# Patient Record
Sex: Female | Born: 1985 | Race: White | Hispanic: No | Marital: Married | Smoking: Former smoker
Health system: Southern US, Community
[De-identification: ages and names within clinical notes are randomized; demographics above are authoritative.]

## PROBLEM LIST (undated history)

## (undated) ENCOUNTER — Inpatient Hospital Stay (HOSPITAL_COMMUNITY): Payer: BLUE CROSS/BLUE SHIELD

## (undated) ENCOUNTER — Inpatient Hospital Stay (HOSPITAL_COMMUNITY): Payer: Self-pay

## (undated) DIAGNOSIS — E079 Disorder of thyroid, unspecified: Secondary | ICD-10-CM

## (undated) DIAGNOSIS — E063 Autoimmune thyroiditis: Secondary | ICD-10-CM

## (undated) DIAGNOSIS — G510 Bell's palsy: Secondary | ICD-10-CM

## (undated) DIAGNOSIS — T7840XA Allergy, unspecified, initial encounter: Secondary | ICD-10-CM

## (undated) DIAGNOSIS — K219 Gastro-esophageal reflux disease without esophagitis: Secondary | ICD-10-CM

## (undated) DIAGNOSIS — E162 Hypoglycemia, unspecified: Secondary | ICD-10-CM

## (undated) DIAGNOSIS — Z8669 Personal history of other diseases of the nervous system and sense organs: Secondary | ICD-10-CM

## (undated) DIAGNOSIS — M549 Dorsalgia, unspecified: Secondary | ICD-10-CM

## (undated) DIAGNOSIS — E559 Vitamin D deficiency, unspecified: Secondary | ICD-10-CM

## (undated) DIAGNOSIS — M069 Rheumatoid arthritis, unspecified: Secondary | ICD-10-CM

## (undated) DIAGNOSIS — F840 Autistic disorder: Secondary | ICD-10-CM

## (undated) DIAGNOSIS — F419 Anxiety disorder, unspecified: Secondary | ICD-10-CM

## (undated) DIAGNOSIS — K829 Disease of gallbladder, unspecified: Secondary | ICD-10-CM

## (undated) DIAGNOSIS — Z8041 Family history of malignant neoplasm of ovary: Secondary | ICD-10-CM

## (undated) DIAGNOSIS — K5900 Constipation, unspecified: Secondary | ICD-10-CM

## (undated) DIAGNOSIS — G5600 Carpal tunnel syndrome, unspecified upper limb: Secondary | ICD-10-CM

## (undated) HISTORY — DX: Autistic disorder: F84.0

## (undated) HISTORY — DX: Allergy, unspecified, initial encounter: T78.40XA

## (undated) HISTORY — PX: TONSILECTOMY/ADENOIDECTOMY WITH MYRINGOTOMY: SHX6125

## (undated) HISTORY — DX: Disease of gallbladder, unspecified: K82.9

## (undated) HISTORY — DX: Rheumatoid arthritis, unspecified: M06.9

## (undated) HISTORY — DX: Vitamin D deficiency, unspecified: E55.9

## (undated) HISTORY — DX: Personal history of other diseases of the nervous system and sense organs: Z86.69

## (undated) HISTORY — DX: Dorsalgia, unspecified: M54.9

## (undated) HISTORY — DX: Carpal tunnel syndrome, unspecified upper limb: G56.00

## (undated) HISTORY — DX: Autoimmune thyroiditis: E06.3

## (undated) HISTORY — DX: Disorder of thyroid, unspecified: E07.9

## (undated) HISTORY — DX: Hypoglycemia, unspecified: E16.2

## (undated) HISTORY — PX: OTHER SURGICAL HISTORY: SHX169

## (undated) HISTORY — DX: Bell's palsy: G51.0

## (undated) HISTORY — DX: Family history of malignant neoplasm of ovary: Z80.41

## (undated) HISTORY — DX: Constipation, unspecified: K59.00

## (undated) HISTORY — DX: Gastro-esophageal reflux disease without esophagitis: K21.9

---

## 2005-03-13 HISTORY — PX: CHOLECYSTECTOMY: SHX55

## 2006-03-30 ENCOUNTER — Inpatient Hospital Stay: Payer: Self-pay | Admitting: Surgery

## 2008-10-19 ENCOUNTER — Emergency Department: Payer: Self-pay | Admitting: Emergency Medicine

## 2009-05-12 ENCOUNTER — Other Ambulatory Visit: Admission: RE | Admit: 2009-05-12 | Discharge: 2009-05-12 | Payer: Self-pay | Admitting: Obstetrics and Gynecology

## 2009-07-06 ENCOUNTER — Ambulatory Visit (HOSPITAL_COMMUNITY): Admission: RE | Admit: 2009-07-06 | Discharge: 2009-07-06 | Payer: Self-pay | Admitting: Obstetrics and Gynecology

## 2009-08-17 ENCOUNTER — Ambulatory Visit (HOSPITAL_COMMUNITY): Admission: RE | Admit: 2009-08-17 | Discharge: 2009-08-17 | Payer: Self-pay | Admitting: Obstetrics and Gynecology

## 2009-09-11 ENCOUNTER — Inpatient Hospital Stay (HOSPITAL_COMMUNITY)
Admission: AD | Admit: 2009-09-11 | Discharge: 2009-09-11 | Payer: Self-pay | Source: Home / Self Care | Admitting: Obstetrics and Gynecology

## 2009-09-11 ENCOUNTER — Ambulatory Visit: Payer: Self-pay | Admitting: Gynecology

## 2009-09-11 DIAGNOSIS — O368190 Decreased fetal movements, unspecified trimester, not applicable or unspecified: Secondary | ICD-10-CM

## 2009-09-12 ENCOUNTER — Ambulatory Visit (HOSPITAL_COMMUNITY): Admission: AD | Admit: 2009-09-12 | Discharge: 2009-09-12 | Payer: Self-pay | Admitting: Obstetrics and Gynecology

## 2009-10-04 ENCOUNTER — Inpatient Hospital Stay (HOSPITAL_COMMUNITY): Admission: AD | Admit: 2009-10-04 | Discharge: 2009-10-04 | Payer: Self-pay | Admitting: Obstetrics and Gynecology

## 2009-10-20 ENCOUNTER — Inpatient Hospital Stay (HOSPITAL_COMMUNITY): Admission: AD | Admit: 2009-10-20 | Discharge: 2009-10-20 | Payer: Self-pay | Admitting: Obstetrics and Gynecology

## 2009-10-20 DIAGNOSIS — O9989 Other specified diseases and conditions complicating pregnancy, childbirth and the puerperium: Secondary | ICD-10-CM

## 2009-10-20 DIAGNOSIS — O99891 Other specified diseases and conditions complicating pregnancy: Secondary | ICD-10-CM

## 2009-10-20 DIAGNOSIS — R109 Unspecified abdominal pain: Secondary | ICD-10-CM

## 2009-11-09 ENCOUNTER — Inpatient Hospital Stay (HOSPITAL_COMMUNITY): Admission: AD | Admit: 2009-11-09 | Discharge: 2009-11-09 | Payer: Self-pay | Admitting: Obstetrics and Gynecology

## 2009-11-09 ENCOUNTER — Ambulatory Visit: Payer: Self-pay | Admitting: Obstetrics and Gynecology

## 2009-11-19 ENCOUNTER — Inpatient Hospital Stay (HOSPITAL_COMMUNITY): Admission: AD | Admit: 2009-11-19 | Discharge: 2009-11-20 | Payer: Self-pay | Admitting: Obstetrics and Gynecology

## 2009-11-19 DIAGNOSIS — O368190 Decreased fetal movements, unspecified trimester, not applicable or unspecified: Secondary | ICD-10-CM

## 2009-11-27 ENCOUNTER — Inpatient Hospital Stay (HOSPITAL_COMMUNITY): Admission: AD | Admit: 2009-11-27 | Discharge: 2009-11-30 | Payer: Self-pay | Admitting: Obstetrics and Gynecology

## 2010-05-26 LAB — CBC
HCT: 25.5 % — ABNORMAL LOW (ref 36.0–46.0)
HCT: 28.8 % — ABNORMAL LOW (ref 36.0–46.0)
HCT: 37.3 % (ref 36.0–46.0)
HCT: 38 % (ref 36.0–46.0)
Hemoglobin: 12.6 g/dL (ref 12.0–15.0)
Hemoglobin: 8.9 g/dL — ABNORMAL LOW (ref 12.0–15.0)
Hemoglobin: 9.9 g/dL — ABNORMAL LOW (ref 12.0–15.0)
Hemoglobin: 12.9 g/dL (ref 12.0–15.0)
MCH: 34.1 pg — ABNORMAL HIGH (ref 26.0–34.0)
MCH: 34.4 pg — ABNORMAL HIGH (ref 26.0–34.0)
MCH: 35.1 pg — ABNORMAL HIGH (ref 26.0–34.0)
MCH: 33.8 pg (ref 26.0–34.0)
MCHC: 34 g/dL (ref 30.0–36.0)
MCHC: 34.3 g/dL (ref 30.0–36.0)
MCHC: 35 g/dL (ref 30.0–36.0)
MCHC: 34 g/dL (ref 30.0–36.0)
MCV: 100.2 fL — ABNORMAL HIGH (ref 78.0–100.0)
MCV: 100.4 fL — ABNORMAL HIGH (ref 78.0–100.0)
MCV: 100.4 fL — ABNORMAL HIGH (ref 78.0–100.0)
MCV: 99.6 fL (ref 78.0–100.0)
Platelets: 214 10*3/uL (ref 150–400)
Platelets: 232 10*3/uL (ref 150–400)
Platelets: 243 10*3/uL (ref 150–400)
Platelets: 241 10*3/uL (ref 150–400)
RBC: 2.55 MIL/uL — ABNORMAL LOW (ref 3.87–5.11)
RBC: 2.87 MIL/uL — ABNORMAL LOW (ref 3.87–5.11)
RBC: 3.71 MIL/uL — ABNORMAL LOW (ref 3.87–5.11)
RBC: 3.81 MIL/uL — ABNORMAL LOW (ref 3.87–5.11)
RDW: 12.7 % (ref 11.5–15.5)
RDW: 12.8 % (ref 11.5–15.5)
RDW: 13.1 % (ref 11.5–15.5)
RDW: 12.8 % (ref 11.5–15.5)
WBC: 14 10*3/uL — ABNORMAL HIGH (ref 4.0–10.5)
WBC: 21.6 10*3/uL — ABNORMAL HIGH (ref 4.0–10.5)
WBC: 9.8 10*3/uL (ref 4.0–10.5)
WBC: 11.2 10*3/uL — ABNORMAL HIGH (ref 4.0–10.5)

## 2010-05-26 LAB — COMPREHENSIVE METABOLIC PANEL
ALT: 15 U/L (ref 0–35)
AST: 18 U/L (ref 0–37)
Albumin: 2.7 g/dL — ABNORMAL LOW (ref 3.5–5.2)
Alkaline Phosphatase: 133 U/L — ABNORMAL HIGH (ref 39–117)
BUN: 3 mg/dL — ABNORMAL LOW (ref 6–23)
CO2: 25 mEq/L (ref 19–32)
Calcium: 8.9 mg/dL (ref 8.4–10.5)
Chloride: 106 mEq/L (ref 96–112)
Creatinine, Ser: 0.52 mg/dL (ref 0.4–1.2)
GFR calc Af Amer: >60 mL/min (ref >60)
GFR calc non Af Amer: >60 mL/min (ref >60)
Glucose, Bld: 86 mg/dL (ref 70–99)
Potassium: 3.6 mEq/L (ref 3.5–5.1)
Sodium: 135 mEq/L (ref 135–145)
Total Bilirubin: 0.8 mg/dL (ref 0.3–1.2)
Total Protein: 6.2 g/dL (ref 6.0–8.3)

## 2010-05-26 LAB — GLUCOSE, CAPILLARY
Glucose-Capillary: 162 mg/dL — ABNORMAL HIGH (ref 70–99)
Glucose-Capillary: 170 mg/dL — ABNORMAL HIGH (ref 70–99)

## 2010-05-26 LAB — URINALYSIS, ROUTINE W REFLEX MICROSCOPIC
Bilirubin Urine: NEGATIVE
Glucose, UA: NEGATIVE mg/dL
Hgb urine dipstick: NEGATIVE
Ketones, ur: 15 mg/dL — AB
Nitrite: NEGATIVE
Protein, ur: NEGATIVE mg/dL
Specific Gravity, Urine: 1.015 (ref 1.005–1.030)
Urobilinogen, UA: 0.2 mg/dL (ref 0.0–1.0)
pH: 6 (ref 5.0–8.0)

## 2010-05-26 LAB — LACTATE DEHYDROGENASE: LDH: 109 U/L (ref 94–250)

## 2010-05-26 LAB — URIC ACID: Uric Acid, Serum: 5.5 mg/dL (ref 2.4–7.0)

## 2010-05-26 LAB — RPR: RPR Ser Ql: NONREACTIVE

## 2010-05-27 LAB — URINALYSIS, ROUTINE W REFLEX MICROSCOPIC
Bilirubin Urine: NEGATIVE
Bilirubin Urine: NEGATIVE
Glucose, UA: NEGATIVE mg/dL
Glucose, UA: NEGATIVE mg/dL
Hgb urine dipstick: NEGATIVE
Hgb urine dipstick: NEGATIVE
Ketones, ur: NEGATIVE mg/dL
Ketones, ur: NEGATIVE mg/dL
Nitrite: NEGATIVE
Nitrite: NEGATIVE
Protein, ur: NEGATIVE mg/dL
Protein, ur: NEGATIVE mg/dL
Specific Gravity, Urine: >1.03 — ABNORMAL HIGH (ref 1.005–1.030)
Specific Gravity, Urine: 1.02 (ref 1.005–1.030)
Urobilinogen, UA: 1 mg/dL (ref 0.0–1.0)
Urobilinogen, UA: 0.2 mg/dL (ref 0.0–1.0)
pH: 6 (ref 5.0–8.0)
pH: 7 (ref 5.0–8.0)

## 2010-05-27 LAB — WET PREP, GENITAL
Trich, Wet Prep: NONE SEEN
Yeast Wet Prep HPF POC: NONE SEEN

## 2010-05-28 LAB — URINALYSIS, ROUTINE W REFLEX MICROSCOPIC
Bilirubin Urine: NEGATIVE
Glucose, UA: NEGATIVE mg/dL
Hgb urine dipstick: NEGATIVE
Ketones, ur: NEGATIVE mg/dL
Nitrite: NEGATIVE
Protein, ur: NEGATIVE mg/dL
Specific Gravity, Urine: 1.015 (ref 1.005–1.030)
Urobilinogen, UA: 0.2 mg/dL (ref 0.0–1.0)
pH: 7 (ref 5.0–8.0)

## 2010-05-29 LAB — COMPREHENSIVE METABOLIC PANEL
ALT: 16 U/L (ref 0–35)
AST: 17 U/L (ref 0–37)
Albumin: 3 g/dL — ABNORMAL LOW (ref 3.5–5.2)
Alkaline Phosphatase: 71 U/L (ref 39–117)
BUN: 4 mg/dL — ABNORMAL LOW (ref 6–23)
CO2: 23 mEq/L (ref 19–32)
Calcium: 9.2 mg/dL (ref 8.4–10.5)
Chloride: 104 mEq/L (ref 96–112)
Creatinine, Ser: 0.43 mg/dL (ref 0.4–1.2)
GFR calc Af Amer: >60 mL/min (ref >60)
GFR calc non Af Amer: >60 mL/min (ref >60)
Glucose, Bld: 84 mg/dL (ref 70–99)
Potassium: 3.9 mEq/L (ref 3.5–5.1)
Sodium: 134 mEq/L — ABNORMAL LOW (ref 135–145)
Total Bilirubin: 1 mg/dL (ref 0.3–1.2)
Total Protein: 6.9 g/dL (ref 6.0–8.3)

## 2010-05-29 LAB — CBC
HCT: 38.2 % (ref 36.0–46.0)
Hemoglobin: 13.1 g/dL (ref 12.0–15.0)
MCH: 34.3 pg — ABNORMAL HIGH (ref 26.0–34.0)
MCHC: 34.5 g/dL (ref 30.0–36.0)
MCV: 99.6 fL (ref 78.0–100.0)
Platelets: 256 10*3/uL (ref 150–400)
RBC: 3.83 MIL/uL — ABNORMAL LOW (ref 3.87–5.11)
RDW: 13 % (ref 11.5–15.5)
WBC: 9.2 10*3/uL (ref 4.0–10.5)

## 2010-05-29 LAB — CREATININE CLEARANCE, URINE, 24 HOUR
Collection Interval-CRCL: 24 hours
Creatinine Clearance: 122 mL/min — ABNORMAL HIGH (ref 75–115)
Creatinine, 24H Ur: 757 mg/d (ref 700–1800)
Creatinine, Urine: 24.2 mg/dL
Creatinine: 0.43 mg/dL (ref 0.4–1.2)
Urine Total Volume-CRCL: 3130 mL

## 2010-05-29 LAB — PROTEIN, URINE, 24 HOUR
Collection Interval-UPROT: 24 hours
Protein, 24H Urine: 31 mg/d — ABNORMAL LOW (ref 50–100)
Protein, Urine: 1 mg/dL
Urine Total Volume-UPROT: 3130 mL

## 2010-05-29 LAB — URINALYSIS, ROUTINE W REFLEX MICROSCOPIC
Bilirubin Urine: NEGATIVE
Glucose, UA: NEGATIVE mg/dL
Hgb urine dipstick: NEGATIVE
Ketones, ur: NEGATIVE mg/dL
Nitrite: NEGATIVE
Protein, ur: NEGATIVE mg/dL
Specific Gravity, Urine: 1.01 (ref 1.005–1.030)
Urobilinogen, UA: 0.2 mg/dL (ref 0.0–1.0)
pH: 6.5 (ref 5.0–8.0)

## 2010-05-29 LAB — CREATININE, SERUM
Creatinine, Ser: 0.43 mg/dL (ref 0.4–1.2)
GFR calc Af Amer: >60 mL/min (ref >60)
GFR calc non Af Amer: >60 mL/min (ref >60)

## 2010-05-29 LAB — DIFFERENTIAL
Basophils Absolute: 0 10*3/uL (ref 0.0–0.1)
Basophils Relative: 0 % (ref 0–1)
Eosinophils Absolute: 0 10*3/uL (ref 0.0–0.7)
Eosinophils Relative: 0 % (ref 0–5)
Lymphocytes Relative: 20 % (ref 12–46)
Lymphs Abs: 1.8 10*3/uL (ref 0.7–4.0)
Monocytes Absolute: 0.7 10*3/uL (ref 0.1–1.0)
Monocytes Relative: 7 % (ref 3–12)
Neutro Abs: 6.7 10*3/uL (ref 1.7–7.7)
Neutrophils Relative %: 72 % (ref 43–77)

## 2010-05-29 LAB — URIC ACID: Uric Acid, Serum: 5.2 mg/dL (ref 2.4–7.0)

## 2010-05-29 LAB — LACTATE DEHYDROGENASE: LDH: 92 U/L — ABNORMAL LOW (ref 94–250)

## 2011-04-17 ENCOUNTER — Ambulatory Visit: Payer: BC Managed Care – PPO | Admitting: Family Medicine

## 2011-06-02 ENCOUNTER — Encounter: Payer: Self-pay | Admitting: Family Medicine

## 2011-06-02 ENCOUNTER — Ambulatory Visit (INDEPENDENT_AMBULATORY_CARE_PROVIDER_SITE_OTHER)
Admission: RE | Admit: 2011-06-02 | Discharge: 2011-06-02 | Disposition: A | Discharge status: Home / Self Care | Payer: 59 | Source: Ambulatory Visit | Attending: Family Medicine | Admitting: Family Medicine

## 2011-06-02 ENCOUNTER — Ambulatory Visit (INDEPENDENT_AMBULATORY_CARE_PROVIDER_SITE_OTHER): Payer: 59 | Admitting: Family Medicine

## 2011-06-02 VITALS — BP 118/82 | HR 94 | Temp 98.0°F | Ht 68.5 in | Wt 248.1 lb

## 2011-06-02 DIAGNOSIS — Z6838 Body mass index (BMI) 38.0-38.9, adult: Secondary | ICD-10-CM | POA: Insufficient documentation

## 2011-06-02 DIAGNOSIS — E669 Obesity, unspecified: Secondary | ICD-10-CM

## 2011-06-02 DIAGNOSIS — E079 Disorder of thyroid, unspecified: Secondary | ICD-10-CM | POA: Insufficient documentation

## 2011-06-02 DIAGNOSIS — Z6841 Body Mass Index (BMI) 40.0 and over, adult: Secondary | ICD-10-CM | POA: Insufficient documentation

## 2011-06-02 DIAGNOSIS — E039 Hypothyroidism, unspecified: Secondary | ICD-10-CM | POA: Insufficient documentation

## 2011-06-02 DIAGNOSIS — M549 Dorsalgia, unspecified: Secondary | ICD-10-CM

## 2011-06-02 DIAGNOSIS — E6609 Other obesity due to excess calories: Secondary | ICD-10-CM | POA: Insufficient documentation

## 2011-06-02 NOTE — Patient Instructions (Signed)
Nice to meet you. We will call you with your xray results. Let me know when you are due for labs.

## 2011-06-02 NOTE — Progress Notes (Signed)
Subjective:    Patient ID: Vanessa Adams Seen, female    DOB: 1986/03/05, 26 y.o.   MRN: [id number]  HPI  26 yo here to establish care.  Low back pain- Pt is a CNA at [employer]. 3 weeks ago, was turning a bariatric patient who pulled on her left arm and pulled her down. Since then, left sided back pain with left leg radiculopathy extending into her foot. No LE weakness. Went to occupational health, given flexeril. Muscle spasms have improved but still has sharp pain with bending, sitting and changing positions with persistent radiculopathy. No urinary symptoms.  Contraception- G1p1- daughter is 46 1/2 years old. Has been on OCPs but forgets to take them. Does not want an IUD and did not like the Nuva ring(falling out frequently). Wants to know what her options are.  Obesity- has been trying to loose weight for months, frustrated. Tried weight watchers, calorie restrictions, exercise- not seeing results.  Hypothyroidism- diagnosed shortly after her daughter was born. On Synthroid 25 micrograms daily. Denies any symptoms of hypo or hyperthyroidism. Does not think she is yet due for labs.  Patient Active Problem List  Diagnoses  . Back pain with radiation  . Thyroid disease  . Obesity   Past Medical History  Diagnosis Date  . Thyroid disease   . Allergy    Past Surgical History  Procedure Date  . Cholecystectomy 2007   History  Substance Use Topics  . Smoking status: Never Smoker   . Smokeless tobacco: Not on file  . Alcohol Use: Yes   Family History  Problem Relation Age of Onset  . Cancer Mother 72  . Hyperlipidemia Father   . Hypertension Father   . Cancer Maternal Grandmother 45    ovarian cancer   Allergies  Allergen Reactions  . Tuberculin Tests    No current outpatient prescriptions on file prior to visit.   The PMH, PSH, Social History, Family History, Medications, and allergies have been reviewed in [facility], and have been updated if  relevant.    Review of Systems See HPI Patient reports no  vision/ hearing changes,anorexia, focal weakness, depression, anxiety    Objective:   Physical Exam BP 118/82  Pulse 94  Temp(Src) 98 F (36.7 C) (Oral)  Ht 5' 8.5" (1.74 m)  Wt 248 lb 1.9 oz (112.546 kg)  BMI 37.18 kg/m2  SpO2 98%  LMP 05/28/2011  General:  Well-developed,well-nourished,in no acute distress; alert,appropriate and cooperative throughout examination Head:  normocephalic and atraumatic.   Eyes:  vision grossly intact, pupils equal, pupils round, and pupils reactive to light.   Ears:  R ear normal and L ear normal.   Nose:  no external deformity.   Mouth:  good dentition.   Neck:  No deformities, masses, or tenderness noted.  Lungs:  Normal respiratory effort, chest expands symmetrically. Lungs are clear to auscultation, no crackles or wheezes. Heart:  Normal rate and regular rhythm. S1 and S2 normal without gallop, murmur, click, rub or other extra sounds. Abdomen:  Bowel sounds positive,abdomen soft and non-tender without masses, organomegaly or hernias noted. Msk:  No deformity or scoliosis noted of thoracic or lumbar spine.   Extremities:  No clubbing, cyanosis, edema, or deformity noted with normal full range of motion of all joints, pos SLR left otherwise unremarkable exam.   Neurologic:  alert & oriented X3 and gait normal.   Skin:  Intact without suspicious lesions or rashes Psych:  Cognition and judgment appear intact. Alert and cooperative  with normal attention span and concentration. No apparent delusions, illusions, hallucinations     Assessment & Plan:   1. Back pain with radiation  New- improving. Will get xray given radiculopathy. Continue conservation management. The patient indicates understanding of these issues and agrees with the plan.  DG Lumbar Spine Complete  2. Thyroid disease  Stable.  Awaiting records, continue current dose of synthroid.   3. Contraception  management Discussed options- she would like to try implanon and will call her OBGYN (Dr. Gerald Leitz) for appt.

## 2011-06-07 ENCOUNTER — Ambulatory Visit (INDEPENDENT_AMBULATORY_CARE_PROVIDER_SITE_OTHER): Payer: 59 | Admitting: Family Medicine

## 2011-06-07 ENCOUNTER — Other Ambulatory Visit (HOSPITAL_COMMUNITY)
Admission: RE | Admit: 2011-06-07 | Discharge: 2011-06-07 | Disposition: A | Discharge status: Home / Self Care | Payer: 59 | Source: Ambulatory Visit | Attending: Family Medicine | Admitting: Family Medicine

## 2011-06-07 ENCOUNTER — Encounter: Payer: Self-pay | Admitting: Family Medicine

## 2011-06-07 VITALS — BP 120/90 | HR 68 | Temp 97.4°F | Wt 246.0 lb

## 2011-06-07 DIAGNOSIS — Z01419 Encounter for gynecological examination (general) (routine) without abnormal findings: Secondary | ICD-10-CM | POA: Insufficient documentation

## 2011-06-07 MED ORDER — NORGESTIM-ETH ESTRAD TRIPHASIC 0.18/0.215/0.25 MG-35 MCG PO TABS: 1.0000 | ORAL_TABLET | Freq: Every day | ORAL | Status: DC

## 2011-06-07 NOTE — Progress Notes (Signed)
Subjective:    Patient ID: Vanessa Adams, female    DOB: [birth year], 26 y.o.   MRN: [id number]  HPI  26 yo here for GYN exam only.   G1p1- daughter is 102 1/2 years old. Has been on OCPs but forgets to take them. Does not want an IUD and did not like the Nuva ring(falling out frequently). Discussed implanon- she called [facility] who cannot see her for several weeks so she is here for Pap/GYN exam.      ROS: See HPI Patient reports no  vision/ hearing changes,anorexia, weight change, fever ,adenopathy, persistant / recurrent hoarseness, swallowing issues, chest pain, edema,persistant / recurrent cough, hemoptysis, dyspnea(rest, exertional, paroxysmal nocturnal), gastrointestinal  bleeding (melena, rectal bleeding), abdominal pain, excessive heart burn, GU symptoms(dysuria, hematuria, pyuria, voiding/incontinence  Issues) syncope, focal weakness, severe memory loss, concerning skin lesions, depression, anxiety, abnormal bruising/bleeding, major joint swelling, breast masses or abnormal vaginal bleeding.    Patient Active Problem List  Diagnoses  . Back pain with radiation  . Thyroid disease  . Obesity  . Gynecological examination   Past Medical History  Diagnosis Date  . Thyroid disease   . Allergy    Past Surgical History  Procedure Date  . Cholecystectomy 2007   History  Substance Use Topics  . Smoking status: Never Smoker   . Smokeless tobacco: Not on file  . Alcohol Use: Yes   Family History  Problem Relation Age of Onset  . Cancer Mother 37  . Hyperlipidemia Father   . Hypertension Father   . Cancer Maternal Grandmother 45    ovarian cancer   Allergies  Allergen Reactions  . Tuberculin Tests    Current Outpatient Prescriptions on File Prior to Visit  Medication Sig Dispense Refill  . cetirizine (ZYRTEC) 10 MG tablet Take 10 mg by mouth daily.      . cyclobenzaprine (FLEXERIL) 10 MG tablet Take 10 mg by mouth 3 (three) times daily as needed.      Marland Kitchen  levothyroxine (SYNTHROID, LEVOTHROID) 25 MCG tablet Take 25 mcg by mouth daily.      . Norgestimate-Ethinyl Estradiol Triphasic (TRINESSA, 28,) 0.18/0.215/0.25 MG-35 MCG tablet Take 1 tablet by mouth daily.       The PMH, PSH, Social History, Family History, Medications, and allergies have been reviewed in [facility], and have been updated if relevant.    Review of Systems See HPI Patient reports no  vision/ hearing changes,anorexia, focal weakness, depression, anxiety    Objective:   Physical Exam LMP 05/28/2011 BP 120/90  Pulse 68  Temp(Src) 97.4 F (36.3 C) (Oral)  Wt 246 lb (111.585 kg)  LMP 05/28/2011  General:  Well-developed,well-nourished,in no acute distress; alert,appropriate and cooperative throughout examination Head:  normocephalic and atraumatic.   Eyes:  vision grossly intact, pupils equal, pupils round, and pupils reactive to light.   Rectal:  no external abnormalities.   Genitalia:  Pelvic Exam:        External: normal female genitalia without lesions or masses        Vagina: normal without lesions or masses        Cervix: normal without lesions or masses        Adnexa: normal bimanual exam without masses or fullness        Uterus: normal by palpation        Pap smear: performed Psych:  Cognition and judgment appear intact. Alert and cooperative with normal attention span and concentration. No apparent delusions, illusions, hallucinations  Assessment & Plan:   1. Gynecological examination    Reviewed preventive care protocols, scheduled due services, and updated immunizations Discussed nutrition, exercise, diet, and healthy lifestyle. Pap performed today.

## 2011-06-07 NOTE — Progress Notes (Signed)
Addended by: Eliezer Bottom on: 06/07/2011 10:31 AM   Modules accepted: Orders

## 2011-06-13 ENCOUNTER — Encounter: Payer: Self-pay | Admitting: *Deleted

## 2011-06-13 LAB — HM PAP SMEAR: HM Pap smear: NORMAL

## 2012-01-12 ENCOUNTER — Other Ambulatory Visit: Payer: Self-pay

## 2012-01-12 NOTE — Telephone Encounter (Signed)
Faxed request [facility] for levothyroxine 0.025mg  instructions take one daily # 30; last filled 10/29/11. No recent labs.Please advise.

## 2012-01-12 NOTE — Telephone Encounter (Signed)
Ok to refill one month.  Needs TSH and FT4 checked prior to giving more refills.

## 2012-01-15 ENCOUNTER — Other Ambulatory Visit: Payer: Self-pay | Admitting: *Deleted

## 2012-01-15 MED ORDER — LEVOTHYROXINE SODIUM 25 MCG PO TABS: 25.0000 ug | ORAL_TABLET | Freq: Every day | ORAL | Status: DC

## 2012-01-15 NOTE — Telephone Encounter (Signed)
Advised patient she will need labs drawn before further refills, she said she will call back to schedule.

## 2012-01-24 ENCOUNTER — Other Ambulatory Visit: Payer: Self-pay | Admitting: Family Medicine

## 2012-05-29 ENCOUNTER — Other Ambulatory Visit: Payer: Self-pay | Admitting: *Deleted

## 2012-05-29 MED ORDER — NORGESTIM-ETH ESTRAD TRIPHASIC 0.18/0.215/0.25 MG-35 MCG PO TABS: ORAL_TABLET | ORAL | Status: DC

## 2012-05-29 NOTE — Telephone Encounter (Signed)
Ok to refill? Has not been seen in 1 year and no upcoming appts.

## 2012-07-03 ENCOUNTER — Other Ambulatory Visit: Payer: Self-pay | Admitting: *Deleted

## 2012-07-03 MED ORDER — LEVOTHYROXINE SODIUM 25 MCG PO TABS: ORAL_TABLET | ORAL | Status: DC

## 2012-08-07 ENCOUNTER — Other Ambulatory Visit: Payer: Self-pay | Admitting: *Deleted

## 2012-08-07 MED ORDER — LEVOTHYROXINE SODIUM 25 MCG PO TABS: ORAL_TABLET | ORAL | Status: DC

## 2012-10-13 ENCOUNTER — Emergency Department: Payer: Self-pay | Admitting: Emergency Medicine

## 2012-10-16 ENCOUNTER — Ambulatory Visit (INDEPENDENT_AMBULATORY_CARE_PROVIDER_SITE_OTHER): Payer: BC Managed Care – PPO | Admitting: Family Medicine

## 2012-10-16 ENCOUNTER — Encounter: Payer: Self-pay | Admitting: Family Medicine

## 2012-10-16 VITALS — BP 130/88 | HR 94 | Temp 98.3°F | Ht 68.5 in | Wt 246.0 lb

## 2012-10-16 DIAGNOSIS — G510 Bell's palsy: Secondary | ICD-10-CM | POA: Insufficient documentation

## 2012-10-16 DIAGNOSIS — E079 Disorder of thyroid, unspecified: Secondary | ICD-10-CM

## 2012-10-16 MED ORDER — NORGESTIM-ETH ESTRAD TRIPHASIC 0.18/0.215/0.25 MG-35 MCG PO TABS: ORAL_TABLET | ORAL | Status: DC

## 2012-10-16 MED ORDER — ACETAMINOPHEN-CODEINE #3 300-30 MG PO TABS: 1.0000 | ORAL_TABLET | ORAL | Status: DC | PRN

## 2012-10-16 MED ORDER — LEVOTHYROXINE SODIUM 25 MCG PO TABS: ORAL_TABLET | ORAL | Status: DC

## 2012-10-16 NOTE — Assessment & Plan Note (Signed)
Refilled medication  X 1 since she is out and she will make appt for  CPX ASAP.

## 2012-10-16 NOTE — Assessment & Plan Note (Signed)
Discussed care in detail. Expected time course, mechanism etc. Pt had multiple questions. Complete prednisone and antiviral. Can use tylenol with codein e for headhace and ibuprofen after completed prednisone.

## 2012-10-16 NOTE — Patient Instructions (Addendum)
Can space prednisone out over morning. When completed prednisone, may use ibuprofen for headache. Home PT can be done. Can use tylenol with codiene for headache. Schedule CPX in next few weeks with Dr. Dayton Martes with labs prior.

## 2012-10-16 NOTE — Progress Notes (Signed)
  Subjective:    Patient ID: Vanessa Adams Seen, female    DOB: [birth year], 27 y.o.   MRN: [id number]  HPI  27 year old female pt of Dr. Elmer Sow presents for follow up after ER visit at [facility] on 8/3 for Bells Palsy. She had noted sudden in AM of drooping on left face, mouth. Cannot close left eye. Started on prednisone and valacyclovir. Started on lubricating drops.  Saw Eye MD on 8/4. Taping eye at night.  She has been doing moderately  Later in the day she notes headache, neck stiffness. She wants to take the prednsione.. At least some later in the day.      Review of Systems  Constitutional: Positive for fatigue. Negative for fever.  HENT: Positive for ear pain. Negative for congestion.   Eyes: Positive for pain, redness and itching.  Respiratory: Negative for shortness of breath.   Cardiovascular: Negative for chest pain, palpitations and leg swelling.  Gastrointestinal: Negative for abdominal pain.       Objective:   Physical Exam  Constitutional: Vital signs are normal. She appears well-developed and well-nourished. She is cooperative.  Non-toxic appearance. She does not appear ill. No distress.  HENT:  Head: Normocephalic.  Right Ear: Hearing, tympanic membrane, external ear and ear canal normal. Tympanic membrane is not erythematous, not retracted and not bulging.  Left Ear: Hearing, tympanic membrane, external ear and ear canal normal. Tympanic membrane is not erythematous, not retracted and not bulging.  Nose: No mucosal edema or rhinorrhea. Right sinus exhibits no maxillary sinus tenderness and no frontal sinus tenderness. Left sinus exhibits no maxillary sinus tenderness and no frontal sinus tenderness.  Mouth/Throat: Uvula is midline, oropharynx is clear and moist and mucous membranes are normal.  Eyes: Conjunctivae, EOM and lids are normal. Pupils are equal, round, and reactive to light. No foreign bodies found.  Neck: Trachea normal and normal range of motion. Neck  supple. Carotid bruit is not present. No mass and no thyromegaly present.  Cardiovascular: Normal rate, regular rhythm, S1 normal, S2 normal, normal heart sounds, intact distal pulses and normal pulses.  Exam reveals no gallop and no friction rub.   No murmur heard. Pulmonary/Chest: Effort normal and breath sounds normal. Not tachypneic. No respiratory distress. She has no decreased breath sounds. She has no wheezes. She has no rhonchi. She has no rales.  Abdominal: Soft. Normal appearance and bowel sounds are normal. There is no tenderness.  Neurological: She is alert. She has normal reflexes. A cranial nerve deficit is present. No sensory deficit. She exhibits abnormal muscle tone. Coordination and gait normal.  Eyelid not closing on right, mouth droop on right.  Skin: Skin is warm, dry and intact. No rash noted.  Psychiatric: Her speech is normal and behavior is normal. Judgment and thought content normal. Her mood appears not anxious. Cognition and memory are normal. She does not exhibit a depressed mood.          Assessment & Plan:

## 2012-10-22 ENCOUNTER — Telehealth: Payer: Self-pay | Admitting: Family Medicine

## 2012-10-22 NOTE — Telephone Encounter (Signed)
Pt called [facility] last night. Will route to MD who saw her last.

## 2012-10-22 NOTE — Telephone Encounter (Signed)
Have her give more time for Bell's Palsy symptoms to resolve.

## 2012-10-22 NOTE — Telephone Encounter (Signed)
Please call pt.. Is her major concern  Headache pain?   We can try stronger pain med if this is is the case, let me know. If she has further concerns we can refer to neuro.

## 2012-10-22 NOTE — Telephone Encounter (Signed)
[facility] Triage Call Report Triage Record Num: [id number] Operator: Kelle Darting Patient Name: Vanessa Adams Call Date & Time: 10/21/2012 10:15:12PM Patient Phone: [phone] PCP: Kerby Nora Patient Gender: Female PCP Fax : [phone] Patient DOB: 04/08/1985 Practice Name: [facility] Reason for Call: Caller: [name]; PCP: Kerby Nora (Family Practice); CB#: [phone]; Call regarding Lt. jaw pain; Afebrile; LMP: 10/21/12; Onset: 10/13/12; Sx notes: Was seen in ED on 10/13/12 and diagnosed with Bells Palsy, then seen in office on 10/16/12 for headache, states that her headache is better but the jaw pain and behind her left ear is worse, was taking Prednisone 20mg  TID, last dose was on 10/20/12, today taking Ibuprofen 800mg  every 8 hours and Tylenol #3 but is not helping her pain, feels swollen, warm and sore, hard to compare to right side due to left side paralysis; Guideline used: Teeth and Jaw Sx; Disposition: See Dentist within 72 hours due to pain triggered by touching, biting override to See provider within 24 hours per nursing judgement; Appt. made: No, pt. to call office for an appt. Protocol(s) Used: Teeth and Jaw Symptoms Recommended Outcome per Protocol: See Dentist Within 72 Hours Reason for Outcome: Pain triggered by touching, biting, chewing, OR by exposure to heat, cold, sweet or sour liquids Care Advice: Eat soft foods (mashed or baked potatoes, Jello, cooked cereal, applesauce, bananas, eggs, cottage cheese, soups, pureed foods or "smoothies") until pain resolves or see provider. ~ Analgesic/Antipyretic Advice - NSAIDs: Consider aspirin, ibuprofen, naproxen or ketoprofen for pain or fever as directed on label or by pharmacist/provider. PRECAUTIONS: - If over 63 years of age, should not take longer than 1 week without consulting provider. EXCEPTIONS: - Should not be used if taking blood thinners or have bleeding problems. - Do not use if  have history of sensitivity/allergy to any of these medications; or history of cardiovascular, ulcer, kidney, liver disease or diabetes unless approved by provider. - Do not exceed recommended dose or frequency. ~ 10/21/2012 10:43:03PM Page 1 of 1 [id number]

## 2012-10-22 NOTE — Telephone Encounter (Signed)
Pt states she had headache and her jaw was hot and swollen last night.  This is a little better today. She doesn't think she needs a neuro referral, just concerned that she had the inflammatory response when she's taking anti-inflammatories.  States the swelling was at the base of the jaw, where the maxilla begins, she states.  She finished the prednisone last night.

## 2012-10-23 NOTE — Telephone Encounter (Signed)
Left message on voice mail asking patient to call back. 

## 2012-10-24 NOTE — Telephone Encounter (Signed)
Spoke with patient.  Her jaw is still hurting.  Not swollen like it was, but hurts when eating or when she moves her head.  Hurts to open her mouth.  Please advise on what to do.

## 2012-10-25 ENCOUNTER — Other Ambulatory Visit: Payer: Self-pay | Admitting: Family Medicine

## 2012-10-25 DIAGNOSIS — Z136 Encounter for screening for cardiovascular disorders: Secondary | ICD-10-CM

## 2012-10-25 DIAGNOSIS — Z0000 Encounter for general adult medical examination without abnormal findings: Secondary | ICD-10-CM

## 2012-10-25 DIAGNOSIS — E079 Disorder of thyroid, unspecified: Secondary | ICD-10-CM

## 2012-10-25 MED ORDER — TRAMADOL HCL 50 MG PO TABS: 50.0000 mg | ORAL_TABLET | Freq: Three times a day (TID) | ORAL | Status: DC | PRN

## 2012-10-25 NOTE — Telephone Encounter (Signed)
Is she still taking prednisone?  If so, please do not take Ibuprofen or other NSAIDs.  The prednisone should be helping with inflammation and pain.  We could try low dose vicodin- if she is interested in this option, please send rx for vicodin 5-325- 1 tab po q 6 hours as needed for pain, #20 with no refills. If taking tylenol, please do not take with vicodin.   Please call us on Monday with an update.

## 2012-10-25 NOTE — Telephone Encounter (Signed)
Noted.  Rx for tramadol sent to her pharmacy.

## 2012-10-25 NOTE — Telephone Encounter (Signed)
Pt is not taking prednisone, it was only x 1 week.  She says she does not "do well" with Vicodin, it causes itching.  She has taken tramadol before and did well with that.

## 2012-10-25 NOTE — Telephone Encounter (Signed)
Pt says she understands that it can take up to 6 mos for symptoms to resolve but her jaw pain is to the point where she cannot eat or sleep, she cannot open her mouth and her jaw is tender to the touch even when trying to lay on a pillow.

## 2012-10-25 NOTE — Telephone Encounter (Signed)
RX placed up front for pick up.  Pt advised.

## 2012-10-25 NOTE — Telephone Encounter (Signed)
Bell's palsy can take time.  I would give it a little more time.  Will also route to Dr. B who saw her.

## 2012-10-28 ENCOUNTER — Other Ambulatory Visit (INDEPENDENT_AMBULATORY_CARE_PROVIDER_SITE_OTHER): Payer: BC Managed Care – PPO

## 2012-10-28 DIAGNOSIS — E079 Disorder of thyroid, unspecified: Secondary | ICD-10-CM

## 2012-10-28 DIAGNOSIS — Z0000 Encounter for general adult medical examination without abnormal findings: Secondary | ICD-10-CM

## 2012-10-28 DIAGNOSIS — Z136 Encounter for screening for cardiovascular disorders: Secondary | ICD-10-CM

## 2012-10-28 LAB — LDL CHOLESTEROL, DIRECT: Direct LDL: 44.6 mg/dL

## 2012-10-28 LAB — COMPREHENSIVE METABOLIC PANEL
ALT: 19 U/L (ref 0–35)
AST: 17 U/L (ref 0–37)
Albumin: 3.5 g/dL (ref 3.5–5.2)
Alkaline Phosphatase: 35 U/L — ABNORMAL LOW (ref 39–117)
BUN: 9 mg/dL (ref 6–23)
CO2: 26 mEq/L (ref 19–32)
Calcium: 8.6 mg/dL (ref 8.4–10.5)
Chloride: 103 mEq/L (ref 96–112)
Creatinine, Ser: 0.6 mg/dL (ref 0.4–1.2)
GFR: 128.4 mL/min (ref >60.00)
Glucose, Bld: 78 mg/dL (ref 70–99)
Potassium: 4 mEq/L (ref 3.5–5.1)
Sodium: 135 mEq/L (ref 135–145)
Total Bilirubin: 1 mg/dL (ref 0.3–1.2)
Total Protein: 7.2 g/dL (ref 6.0–8.3)

## 2012-10-28 LAB — LIPID PANEL
Cholesterol: 116 mg/dL (ref 0–200)
HDL: 31.8 mg/dL — ABNORMAL LOW (ref >39.00)
Total CHOL/HDL Ratio: 4
Triglycerides: 344 mg/dL — ABNORMAL HIGH (ref 0.0–149.0)
VLDL: 68.8 mg/dL — ABNORMAL HIGH (ref 0.0–40.0)

## 2012-10-28 LAB — TSH: TSH: 10.46 u[IU]/mL — ABNORMAL HIGH (ref 0.35–5.50)

## 2012-10-28 LAB — T4, FREE: Free T4: 0.85 ng/dL (ref 0.60–1.60)

## 2012-10-30 ENCOUNTER — Telehealth: Payer: Self-pay

## 2012-10-30 ENCOUNTER — Other Ambulatory Visit: Payer: BC Managed Care – PPO

## 2012-10-30 DIAGNOSIS — G510 Bell's palsy: Secondary | ICD-10-CM

## 2012-10-30 MED ORDER — ACETAMINOPHEN-CODEINE #3 300-30 MG PO TABS: 1.0000 | ORAL_TABLET | ORAL | Status: DC | PRN

## 2012-10-30 NOTE — Telephone Encounter (Signed)
Ok to refill one time only as entered below.

## 2012-10-30 NOTE — Telephone Encounter (Signed)
Pt left v/m; pain due to Bell's palsy; Tramadol helps jaw pain during the day but at night pt has extreme pain on lt side of face if touches face with her pillow; pt having difficult time of sleeping, if pt rolls over and face touches pillow pt is awakened by shooting pain thru jaw and temporal area. Pt request another med for pain at night. Heat and ice do not help;few nights ago pt took tramadol no effect and 1 1/2 hrs later took another Tramadol still no relief and 2 hours later took another Tramadol, still no relief. Pt felt like someone punched her in the face. [facility]. Pt request cb and is willing for neuro referral.

## 2012-10-30 NOTE — Telephone Encounter (Signed)
Medication called to [facility], advised patient.

## 2012-10-30 NOTE — Telephone Encounter (Signed)
I have not seen her for this but I do agree neurology referral is warranted.  Referral placed.  She already has Rx for Tylenol #3 which contains codeine.

## 2012-10-30 NOTE — Telephone Encounter (Signed)
Advised patient.  She is out of the tylenol 3 and needs a refill called to [facility].  She says the pain is very bad at night.  The tramadol works ok during the day, but she needs the tylenol for nighttime.

## 2012-11-04 ENCOUNTER — Encounter: Payer: Self-pay | Admitting: Family Medicine

## 2012-11-04 ENCOUNTER — Ambulatory Visit (INDEPENDENT_AMBULATORY_CARE_PROVIDER_SITE_OTHER): Payer: BC Managed Care – PPO | Admitting: Family Medicine

## 2012-11-04 VITALS — BP 120/80 | HR 96 | Temp 97.8°F | Wt 246.0 lb

## 2012-11-04 DIAGNOSIS — E781 Pure hyperglyceridemia: Secondary | ICD-10-CM

## 2012-11-04 DIAGNOSIS — Z0000 Encounter for general adult medical examination without abnormal findings: Secondary | ICD-10-CM

## 2012-11-04 DIAGNOSIS — G510 Bell's palsy: Secondary | ICD-10-CM

## 2012-11-04 DIAGNOSIS — Z01419 Encounter for gynecological examination (general) (routine) without abnormal findings: Secondary | ICD-10-CM

## 2012-11-04 DIAGNOSIS — E079 Disorder of thyroid, unspecified: Secondary | ICD-10-CM

## 2012-11-04 MED ORDER — HYDROCODONE-IBUPROFEN 5-200 MG PO TABS: 1.0000 | ORAL_TABLET | Freq: Three times a day (TID) | ORAL | Status: DC | PRN

## 2012-11-04 MED ORDER — LEVOTHYROXINE SODIUM 50 MCG PO TABS: ORAL_TABLET | ORAL | Status: DC

## 2012-11-04 NOTE — Patient Instructions (Addendum)
Good to see you. Please stop by to see Shirlee Limerick on your way out to set up your referral.  We are increasing your synthroid to 50 mcg daily.  Please come in for labs in 8 weeks.  Triglycerides are too high.    Decrease added sugars, eliminate trans fats, increase fiber.  All these changes together can drop triglycerides by almost 50%.

## 2012-11-04 NOTE — Addendum Note (Signed)
Addended by: Dianne Dun on: 11/04/2012 10:47 AM   Modules accepted: Orders

## 2012-11-04 NOTE — Progress Notes (Addendum)
Subjective:    Patient ID: Vanessa Adams, female    DOB: [birth year], 27 y.o.   MRN: [id number]  HPI  27 yo here for CPX/pap smear.  Last pap smear done by me was in 05/2011- normal.  G1p1- daughter is 81 years old. Has been on OCPs and feels periods are regular.  Bell's Palsy- saw Dr. Ermalene Searing on 8/6 for this.  Headaches gone, now has facial and ear pain.  Facial droop is better.  Can close left eye. Finished course prednisone and valtrex.  Now taking Ibuprofen and Tylenol #3.   Saw eye doctor last week- has another appointment in 3 weeks.  Elevated TG- admits to eating more cards and sweets lately. Lab Results  Component Value Date   CHOL 116 10/28/2012   HDL 31.80* 10/28/2012   LDLDIRECT 44.6 10/28/2012   TRIG 344.0* 10/28/2012   CHOLHDL 4 10/28/2012   Hypothyroidism- on synthroid 25 mcg daily.  TSH elevated, Ft 4 wnl.  She has been more fatigued.  Denies any other symptoms of hypothyroidism. Lab Results  Component Value Date   TSH 10.46* 10/28/2012      Patient Active Problem List   Diagnosis Date Noted  . Bell's palsy 10/16/2012  . Encounter for routine gynecological examination 06/07/2011  . Back pain with radiation 06/02/2011  . Obesity 06/02/2011  . Thyroid disease    Past Medical History  Diagnosis Date  . Thyroid disease   . Allergy    Past Surgical History  Procedure Laterality Date  . Cholecystectomy  2007   History  Substance Use Topics  . Smoking status: Never Smoker   . Smokeless tobacco: Not on file  . Alcohol Use: Yes   Family History  Problem Relation Age of Onset  . Cancer Mother 43  . Hyperlipidemia Father   . Hypertension Father   . Cancer Maternal Grandmother 45    ovarian cancer   Allergies  Allergen Reactions  . Tuberculin Tests    Current Outpatient Prescriptions on File Prior to Visit  Medication Sig Dispense Refill  . acetaminophen-codeine (TYLENOL #3) 300-30 MG per tablet Take 1 tablet by mouth every 4 (four) hours as  needed for pain.  30 tablet  0  . cetirizine (ZYRTEC) 10 MG tablet Take 10 mg by mouth daily.      Marland Kitchen levothyroxine (SYNTHROID, LEVOTHROID) 25 MCG tablet TAKE 1 TABLET BY MOUTH EVERY DAY* NEEDS APPOINTMENT WITH DR FOR ADDITIONAL REFILLS*  30 tablet  0  . Norgestimate-Ethinyl Estradiol Triphasic (TRINESSA, 28,) 0.18/0.215/0.25 MG-35 MCG tablet TAKE 1 TABLET BY MOUTH DAILY  28 tablet  0  . predniSONE (DELTASONE) 20 MG tablet Take 20 mg by mouth daily.      . traMADol (ULTRAM) 50 MG tablet Take 1 tablet (50 mg total) by mouth every 8 (eight) hours as needed for pain.  30 tablet  0   No current facility-administered medications on file prior to visit.   The PMH, PSH, Social History, Family History, Medications, and allergies have been reviewed in [facility], and have been updated if relevant.  ROS: See HPI Patient reports no  vision/ hearing changes,anorexia, weight change, fever ,adenopathy, persistant / recurrent hoarseness, swallowing issues, chest pain, edema,persistant / recurrent cough, hemoptysis, dyspnea(rest, exertional, paroxysmal nocturnal), gastrointestinal  bleeding (melena, rectal bleeding), abdominal pain, excessive heart burn, GU symptoms(dysuria, hematuria, pyuria, voiding/incontinence  Issues) syncope, focal weakness, severe memory loss, concerning skin lesions, depression, anxiety, abnormal bruising/bleeding, major joint swelling, breast masses or abnormal vaginal bleeding.  Patient Active Problem List   Diagnosis Date Noted  . Bell's palsy 10/16/2012  . Encounter for routine gynecological examination 06/07/2011  . Back pain with radiation 06/02/2011  . Obesity 06/02/2011  . Thyroid disease    Past Medical History  Diagnosis Date  . Thyroid disease   . Allergy    Past Surgical History  Procedure Laterality Date  . Cholecystectomy  2007   History  Substance Use Topics  . Smoking status: Never Smoker   . Smokeless tobacco: Not on file  . Alcohol Use: Yes   Family History   Problem Relation Age of Onset  . Cancer Mother 68  . Hyperlipidemia Father   . Hypertension Father   . Cancer Maternal Grandmother 45    ovarian cancer   Allergies  Allergen Reactions  . Tuberculin Tests    Current Outpatient Prescriptions on File Prior to Visit  Medication Sig Dispense Refill  . acetaminophen-codeine (TYLENOL #3) 300-30 MG per tablet Take 1 tablet by mouth every 4 (four) hours as needed for pain.  30 tablet  0  . cetirizine (ZYRTEC) 10 MG tablet Take 10 mg by mouth daily.      Marland Kitchen levothyroxine (SYNTHROID, LEVOTHROID) 25 MCG tablet TAKE 1 TABLET BY MOUTH EVERY DAY* NEEDS APPOINTMENT WITH DR FOR ADDITIONAL REFILLS*  30 tablet  0  . Norgestimate-Ethinyl Estradiol Triphasic (TRINESSA, 28,) 0.18/0.215/0.25 MG-35 MCG tablet TAKE 1 TABLET BY MOUTH DAILY  28 tablet  0  . predniSONE (DELTASONE) 20 MG tablet Take 20 mg by mouth daily.      . traMADol (ULTRAM) 50 MG tablet Take 1 tablet (50 mg total) by mouth every 8 (eight) hours as needed for pain.  30 tablet  0   No current facility-administered medications on file prior to visit.   The PMH, PSH, Social History, Family History, Medications, and allergies have been reviewed in [facility], and have been updated if relevant.    Review of Systems See HPI Patient reports no  vision/ hearing changes,anorexia, focal weakness, depression, anxiety    Objective:   Physical Exam BP 120/80  Pulse 96  Temp(Src) 97.8 F (36.6 C)  Wt 246 lb (111.585 kg)  BMI 36.86 kg/m2  General:  Well-developed,well-nourished,in no acute distress; alert,appropriate and cooperative throughout examination Head:  normocephalic and atraumatic.   Left facial droop, can close eyes bilaterally Eyes:  vision grossly intact, pupils equal, pupils round, and pupils reactive to light.   Ears:  R ear normal and L ear normal.   Nose:  no external deformity.   Mouth:  good dentition.   Neck:  No deformities, masses, or tenderness noted. Breasts:  No mass,  nodules, thickening, tenderness, bulging, retraction, inflamation, nipple discharge or skin changes noted.   Lungs:  Normal respiratory effort, chest expands symmetrically. Lungs are clear to auscultation, no crackles or wheezes. Heart:  Normal rate and regular rhythm. S1 and S2 normal without gallop, murmur, click, rub or other extra sounds. Abdomen:  Bowel sounds positive,abdomen soft and non-tender without masses, organomegaly or hernias noted. Msk:  No deformity or scoliosis noted of thoracic or lumbar spine.   Extremities:  No clubbing, cyanosis, edema, or deformity noted with normal full range of motion of all joints.   Neurologic:  alert & oriented X3 and gait normal.   Skin:  Intact without suspicious lesions or rashes Cervical Nodes:  No lymphadenopathy noted Axillary Nodes:  No palpable lymphadenopathy Psych:  Cognition and judgment appear intact. Alert and cooperative with normal attention  span and concentration. No apparent delusions, illusions, hallucinations       Assessment & Plan:    1.  Routine Annual Exam- Reviewed preventive care protocols, scheduled due services, and updated immunizations Discussed nutrition, exercise, diet, and healthy lifestyle.    2. Bell's palsy Improving but she is frustrated that symptoms are persisting.  Attempted to reassure pt- Discussed typical course of bell's palsy.  Will refer to neuro but pt can cancel appt if symptoms resolved.    3. Thyroid disease Symptomatic with high TSH- will increase synthroid to 50 mcg daily. Repeat labs in 8 weeks.  4. Hypertriglyceridemia Deteriorated likely due to diet.  Will repeat lipid panel when we recheck thyroid panel. The patient indicates understanding of these issues and agrees with the plan.

## 2012-11-14 ENCOUNTER — Other Ambulatory Visit: Payer: Self-pay

## 2012-11-14 MED ORDER — HYDROCODONE-IBUPROFEN 5-200 MG PO TABS: 1.0000 | ORAL_TABLET | Freq: Three times a day (TID) | ORAL | Status: DC | PRN

## 2012-11-14 NOTE — Telephone Encounter (Signed)
Pt left v/m requesting vicoprofen [facility]. Pt taking vicoprofen q8h; pt continues with pain lt side of ear, down the jaw and into the neck.Please advise.

## 2012-11-14 NOTE — Telephone Encounter (Signed)
Script called to [facility]

## 2012-11-25 ENCOUNTER — Encounter: Payer: Self-pay | Admitting: Neurology

## 2012-11-25 ENCOUNTER — Ambulatory Visit (INDEPENDENT_AMBULATORY_CARE_PROVIDER_SITE_OTHER): Payer: BC Managed Care – PPO | Admitting: Neurology

## 2012-11-25 VITALS — BP 130/78 | HR 88 | Temp 98.1°F | Ht 68.0 in | Wt 248.0 lb

## 2012-11-25 DIAGNOSIS — G510 Bell's palsy: Secondary | ICD-10-CM

## 2012-11-25 NOTE — Progress Notes (Addendum)
NEUROLOGY CONSULTATION NOTE  Vanessa Adams MRN: [id number] DOB: [birth year]  Referring provider: Dr. Dayton Martes Primary care provider: Dr. Dayton Martes  Reason for consult:  Left facial weakness.  HISTORY OF PRESENT ILLNESS: Vanessa Adams is a 27 year old woman with hypertriglyceridemia and thyroid disease who presents for evaluation of left facial weakness.  Records and images were personally reviewed where available.    On 10/13/12, she noted sudden onset of left facial droop with inability to close left eye.  She also noted that the left side of her tongue felt strange, especially when drinking coffee.  She also noted fullness, tinnitus, phonophobia and hyperacusis in the left ear.  She did not have any visual disturbance, facial numbness, vertigo, gait instability or weakness or numbness of the limbs.  She was afraid she was having a stroke.  She presented to the ED where she was started on prednisone and valacyclovir.  She saw the eye doctor the following day and instructed to use lubricating drops and to tape up her eyelid at night.  She has noted improvement in facial weakness since onset.  She is able to close her left eye now, although with effort.  She has history of neck pain and TMJ dysfunction, and noted exacerbation of these symptoms.  She has pain from the TMJ and behind her ear, which can radiate down the left side of her jaw or to the left eye.  At first, she noted brief dizziness with head movement lasting a second or two, but nothing persistent.  She continues to use the lacrilube and eye drops but no longer uses eye patch at night.  She had no rash or viral illness or fever.  No neck trauma.  She has no prior history of Bell's palsy, but her mother once had Bell's palsy  10/28/12: TSH 10.46, Free T4 0.85.  PAST MEDICAL HISTORY: Past Medical History  Diagnosis Date  . Thyroid disease   . Allergy     PAST SURGICAL HISTORY: Past Surgical History  Procedure Laterality Date  .  Cholecystectomy  2007    MEDICATIONS: Current Outpatient Prescriptions on File Prior to Visit  Medication Sig Dispense Refill  . hydrocodone-ibuprofen (VICOPROFEN) 5-200 MG per tablet Take 1 tablet by mouth every 8 (eight) hours as needed for pain.  30 tablet  0  . levothyroxine (SYNTHROID, LEVOTHROID) 50 MCG tablet TAKE 1 TABLET BY MOUTH EVERY DAY* NEEDS APPOINTMENT WITH DR FOR ADDITIONAL REFILLS*  30 tablet  3  . Norgestimate-Ethinyl Estradiol Triphasic (TRINESSA, 28,) 0.18/0.215/0.25 MG-35 MCG tablet TAKE 1 TABLET BY MOUTH DAILY  28 tablet  0  . cetirizine (ZYRTEC) 10 MG tablet Take 10 mg by mouth daily.       No current facility-administered medications on file prior to visit.    ALLERGIES: Allergies  Allergen Reactions  . Sulfa Antibiotics   . Tuberculin Tests     FAMILY HISTORY: Family History  Problem Relation Age of Onset  . Cancer Mother 22  . Hyperlipidemia Father   . Hypertension Father   . Cancer Maternal Grandmother 45    ovarian cancer    SOCIAL HISTORY: History   Social History  . Marital Status: Married    Spouse Name: N/A    Number of Children: N/A  . Years of Education: N/A   Occupational History  . Not on file.   Social History Main Topics  . Smoking status: Never Smoker   . Smokeless tobacco: Never Used  . Alcohol Use: Yes  .  Drug Use: No  . Sexual Activity: Not on file   Other Topics Concern  . Not on file   Social History Narrative  . No narrative on file    REVIEW OF SYSTEMS: Constitutional: No fevers, chills, or sweats, no generalized fatigue, change in appetite Eyes: No visual changes, double vision, eye pain Ear, nose and throat: left phonophobia, left ear pain and fullness Cardiovascular: No chest pain, palpitations Respiratory:  No shortness of breath at rest or with exertion, wheezes GastrointestinaI: No nausea, vomiting, diarrhea, abdominal pain, fecal incontinence Genitourinary:  No dysuria, urinary retention or  frequency Musculoskeletal:  Neck pain Integumentary: No rash, pruritus, skin lesions Neurological: as above Psychiatric: No depression, insomnia, anxiety Endocrine: No palpitations, fatigue, diaphoresis, mood swings, change in appetite, change in weight, increased thirst Hematologic/Lymphatic:  No anemia, purpura, petechiae. Allergic/Immunologic: no itchy/runny eyes, nasal congestion, recent allergic reactions, rashes  PHYSICAL EXAM: Filed Vitals:   11/25/12 0811  BP: 130/78  Pulse: 88  Temp: 98.1 F (36.7 C)   General: No acute distress Head:  Normocephalic/atraumatic, tenderness to palpation of both TMJs, no lesions in ear canals Neck: supple, paraspinal tenderness and suboccipital tenderness, full range of motion Back: No paraspinal tenderness Heart: regular rate and rhythm Lungs: Clear to auscultation bilaterally. Vascular: No carotid bruits. Neurological Exam: Mental status: alert and oriented to person, place, and time, speech fluent and not dysarthric, language intact. Cranial nerves: CN I: not tested CN II: pupils equal, round and reactive to light, visual fields intact, fundi unremarkable. CN III, IV, VI:  full range of motion, no nystagmus, no ptosis CN V: facial sensation intact CN VII: moderate left upper facial weakness, does not bury left lashes and able to open manually, reduced forehead muscle weakness although present, mild-to moderate left lower facial weakness CN VIII: left hyperacusis CN IX, X: gag intact, uvula midline CN XI: sternocleidomastoid and trapezius muscles intact CN XII: tongue midline Bulk & Tone: normal, no fasciculations. Motor: 5/5 throughout Sensation: temperature and vibration intact Deep Tendon Reflexes: 2+ throughout, toes down Finger to nose testing: normal Heel to shin: normal Gait: normal, able to walk in tandem. Romberg negative.  IMPRESSION & PLAN: Likely moderate left Bell's palsy, complicated by exacerbation of remote neck  and TMJ pain.  No focal or localizing symptoms to suggest another process.  Some improvement of symptoms is suggestive of good prognosis.  Unfortunately, there is nothing I can provide to improve prognosis.  Only time will tell.  Consider seeing her dentist regarding new onset of TMJ pain.  Call with any questions or concerns.  Continue eye drops and seek re-evaluation by eye doctor if experiencing new orbital pain or blurred vision.  45 minutes spent with patient, over 50% spent counseling and coordinating care.  Thank you for allowing me to take part in the care of this patient.  Shon Millet, DO  CC:  Ruthe Mannan, MD

## 2012-11-25 NOTE — Patient Instructions (Addendum)
It does seem like a Bells Palsy and there may be a TMJ component as well. 1.  At this point, we just have to wait and see.  The fact that you have some improvement is a good sign. 2.  Consider seeing the dentist again for TMJ-type pain. 3.  Continue lacrilube.  See the eye doctor again if you develop eye pain or blurred vision. 4.  Call with any questions or concerns.

## 2012-12-09 ENCOUNTER — Telehealth: Payer: Self-pay

## 2012-12-09 NOTE — Telephone Encounter (Signed)
Pt left v/m; was seen 11/04/12 for check up; pt said she is presently taking BC and pt has menstrual cycle monthly; pt request change in [facility] pill where pt will only have menstrual cycle quarterly. [facility].Please advise.

## 2012-12-10 MED ORDER — LEVONORGEST-ETH ESTRAD 91-DAY 0.15-0.03 MG PO TABS: 1.0000 | ORAL_TABLET | Freq: Every day | ORAL | Status: DC

## 2012-12-10 NOTE — Telephone Encounter (Signed)
Pt informed

## 2012-12-10 NOTE — Telephone Encounter (Signed)
Rx sent 

## 2013-01-13 ENCOUNTER — Encounter: Payer: Self-pay | Admitting: Internal Medicine

## 2013-01-13 ENCOUNTER — Ambulatory Visit (INDEPENDENT_AMBULATORY_CARE_PROVIDER_SITE_OTHER): Payer: BC Managed Care – PPO | Admitting: Internal Medicine

## 2013-01-13 VITALS — BP 118/78 | HR 98 | Temp 97.9°F | Ht 68.5 in | Wt 253.8 lb

## 2013-01-13 DIAGNOSIS — J209 Acute bronchitis, unspecified: Secondary | ICD-10-CM

## 2013-01-13 MED ORDER — ALBUTEROL SULFATE HFA 108 (90 BASE) MCG/ACT IN AERS: 2.0000 | INHALATION_SPRAY | Freq: Four times a day (QID) | RESPIRATORY_TRACT | Status: DC | PRN

## 2013-01-13 MED ORDER — AZITHROMYCIN 250 MG PO TABS: ORAL_TABLET | ORAL | Status: DC

## 2013-01-13 MED ORDER — HYDROCODONE-HOMATROPINE 5-1.5 MG/5ML PO SYRP: 5.0000 mL | ORAL_SOLUTION | Freq: Three times a day (TID) | ORAL | Status: DC | PRN

## 2013-01-13 NOTE — Progress Notes (Signed)
Advised patient by telephone that script for cough medication could not be called to the pharmacy. Patient advised that written script is up front for her to pick up.

## 2013-01-13 NOTE — Patient Instructions (Signed)

## 2013-01-13 NOTE — Progress Notes (Signed)
HPI  Pt presents to the clinic today with c/o cough and sputum production. This started about 5 days ago. She is coughing up thick green sputum. She also c/o fatigue, runny nose. She denies fever, chills or body aches. She had taken Mucinex OTC without much relief. She does have a history of allergies but not asthma. She has had sick contacts.  Review of Systems      Past Medical History  Diagnosis Date  . Thyroid disease   . Allergy     Family History  Problem Relation Age of Onset  . Cancer Mother 74  . Hyperlipidemia Father   . Hypertension Father   . Cancer Maternal Grandmother 45    ovarian cancer    History   Social History  . Marital Status: Married    Spouse Name: N/A    Number of Children: N/A  . Years of Education: N/A   Occupational History  . Not on file.   Social History Main Topics  . Smoking status: Former Games developer  . Smokeless tobacco: Never Used     Comment: quit at age 68  . Alcohol Use: Yes  . Drug Use: No  . Sexual Activity: Not on file   Other Topics Concern  . Not on file   Social History Narrative  . No narrative on file    Allergies  Allergen Reactions  . Sulfa Antibiotics   . Tuberculin Tests      Constitutional: Positive headache, fatigue. Denies fever, abrupt weight changes.  HEENT:  Positive runny nose, nasal congestion. Denies eye redness, eye pain, pressure behind the eyes, facial pain, ear pain, ringing in the ears, wax buildup or bloody nose. Respiratory: Positive cough. Denies difficulty breathing or shortness of breath.  Cardiovascular: Denies chest pain, chest tightness, palpitations or swelling in the hands or feet.   No other specific complaints in a complete review of systems (except as listed in HPI above).  Objective:   BP 118/78  Pulse 98  Temp(Src) 97.9 F (36.6 C) (Oral)  Ht 5' 8.5" (1.74 m)  Wt 253 lb 12 oz (115.1 kg)  BMI 38.02 kg/m2  SpO2 96% Wt Readings from Last 3 Encounters:  01/13/13 253 lb 12 oz  (115.1 kg)  11/25/12 248 lb (112.492 kg)  11/04/12 246 lb (111.585 kg)     General: Appears his stated age, well developed, well nourished in NAD. HEENT: Head: normal shape and size; Eyes: sclera white, no icterus, conjunctiva pink, PERRLA and EOMs intact; Ears: Tm's gray and intact, normal light reflex; Nose: mucosa pink and moist, septum midline; Throat/Mouth: + PND. Teeth present, mucosa erythematous and moist, no exudate noted, no lesions or ulcerations noted.  Neck: Mild cervical lymphadenopathy. Neck supple, trachea midline. No massses, lumps or thyromegaly present.  Cardiovascular: Normal rate and rhythm. S1,S2 noted.  No murmur, rubs or gallops noted. No JVD or BLE edema. No carotid bruits noted. Pulmonary/Chest: Normal effort and scattered rhonchi in upper lobes. No respiratory distress. No wheezes, rales or ronchi noted.      Assessment & Plan:   Acute Bronchitis:  Get some rest and drink plenty of water Do salt water gargles for the sore throat eRx for Azithromax x 5 days eRx for Hycodan cough syrup  RTC as needed or if symptoms persist.

## 2013-01-13 NOTE — Addendum Note (Signed)
Addended by: Lorre Munroe on: 01/13/2013 11:21 AM   Modules accepted: Orders

## 2013-01-13 NOTE — Progress Notes (Signed)
HPI: Pt presents to office today with concerns of cough and congestion for last 5 days. Pt endorses cough with productive green/clear sputum, nasal congestion, nasal discharge, shortness of breath, wheezing, and fatigue. Pt denies chest pain, sore throat, ear pain, fever, or chills. Pt tried OTC mucinex with some relief. Pt endorses being around sick contacts; she works as a Higher education careers adviser.   Past Medical History  Diagnosis Date  . Thyroid disease   . Allergy     Current Outpatient Prescriptions  Medication Sig Dispense Refill  . cetirizine (ZYRTEC) 10 MG tablet Take 10 mg by mouth daily.      . ferrous fumarate (HEMOCYTE - 106 MG FE) 325 (106 FE) MG TABS tablet Take 1 tablet by mouth.      Marland Kitchen guaiFENesin (ROBITUSSIN) 100 MG/5ML liquid Take 200 mg by mouth every 4 (four) hours as needed for cough.      Marland Kitchen ibuprofen (ADVIL,MOTRIN) 200 MG tablet Take 200 mg by mouth every 6 (six) hours as needed for pain.      Marland Kitchen levonorgestrel-ethinyl estradiol (SEASONALE) 0.15-0.03 MG tablet Take 1 tablet by mouth daily.  1 Package  4  . levothyroxine (SYNTHROID, LEVOTHROID) 50 MCG tablet TAKE 1 TABLET BY MOUTH EVERY DAY* NEEDS APPOINTMENT WITH DR FOR ADDITIONAL REFILLS*  30 tablet  3  . hydrocodone-ibuprofen (VICOPROFEN) 5-200 MG per tablet Take 1 tablet by mouth every 8 (eight) hours as needed for pain.  30 tablet  0  . Norgestimate-Ethinyl Estradiol Triphasic (TRINESSA, 28,) 0.18/0.215/0.25 MG-35 MCG tablet TAKE 1 TABLET BY MOUTH DAILY  28 tablet  0   No current facility-administered medications for this visit.    Allergies  Allergen Reactions  . Sulfa Antibiotics   . Tuberculin Tests     ROS:  Constitutional: Endorses fatigue. Denies fever, malaise, headache or abrupt weight changes.  HEENT:Endorses nasal discharge and nasal congestion. Denies eye pain, eye redness, ear pain, ringing in the ears, wax buildup, bloody nose, or sore throat. Respiratory:Endorses shortness of breath, cough, difficulty  breathing greater at night, and sputum production.  Cardiovascular: Denies chest pain, chest tightness, palpitations or swelling in the hands or feet.   No other specific complaints in a complete review of systems (except as listed in HPI above).  PE:  BP 118/78  Pulse 98  Temp(Src) 97.9 F (36.6 C) (Oral)  Ht 5' 8.5" (1.74 m)  Wt 253 lb 12 oz (115.1 kg)  BMI 38.02 kg/m2  SpO2 96% Wt Readings from Last 3 Encounters:  01/13/13 253 lb 12 oz (115.1 kg)  11/25/12 248 lb (112.492 kg)  11/04/12 246 lb (111.585 kg)    General: Appears their stated age, well developed, well nourished in NAD. HEENT: Head: normal shape and size; Eyes: sclera white, no icterus, conjunctiva pink, PERRLA and EOMs intact; Ears: Tm's gray and intact, normal light reflex; Nose: mucosa pink and moist, septum midline; Throat/Mouth: Teeth present, mucosa pink and moist, no lesions or ulcerations noted.  Neck: Normal range of motion. Neck supple, trachea midline. No massses, lumps or thyromegaly present.  Cardiovascular: Normal rate and rhythm. S1,S2 noted.  No murmur, rubs or gallops noted. No JVD or BLE edema. No carotid bruits noted. Pulmonary/Chest: Normal effort and positive vesicular breath sounds. No respiratory distress.  Rales noted to bilateral upper lobes. No wheezing or ronchi noted.  Psychiatric: Mood and affect normal. Behavior is normal. Judgment and thought content normal.    Assessment and Plan: Acute Bronchitis Prescribed Z pack Prescribed Hycodan  cough syrup 5 ml every 6 hours as needed for cough Prescribed Albuterol inhaler per pt request Follow up in 3-5 days for worsening symptoms  Almedia Cordell S, Student-NP

## 2013-01-22 ENCOUNTER — Ambulatory Visit (INDEPENDENT_AMBULATORY_CARE_PROVIDER_SITE_OTHER): Payer: BC Managed Care – PPO | Admitting: Family Medicine

## 2013-01-22 ENCOUNTER — Encounter: Payer: Self-pay | Admitting: Family Medicine

## 2013-01-22 VITALS — BP 108/74 | HR 88 | Temp 98.2°F | Ht 68.5 in | Wt 253.2 lb

## 2013-01-22 DIAGNOSIS — N390 Urinary tract infection, site not specified: Secondary | ICD-10-CM

## 2013-01-22 MED ORDER — CIPROFLOXACIN HCL 250 MG PO TABS: 250.0000 mg | ORAL_TABLET | Freq: Two times a day (BID) | ORAL | Status: DC

## 2013-01-22 NOTE — Progress Notes (Signed)
Nature conservation officer at [facility] [address] [place] [address] Phone: [phone] Fax: [phone]  Date:  01/22/2013   Name:  Vanessa Adams   DOB:  1985-07-29   MRN:  [id number] Gender: female Age: 27 y.o.  PCP:  Ruthe Mannan, MD  Evaluating MD: Hannah Beat, MD  History of Present Illness:  Patient presents with minimal burning, urgency. No vaginal discharge or external irritation.  No STD exposure. No abd pain, no flank pain.   Patient Active Problem List   Diagnosis Date Noted  . Hypertriglyceridemia 11/04/2012  . Bell's palsy 10/16/2012  . Back pain with radiation 06/02/2011  . Obesity 06/02/2011  . Thyroid disease     Past Medical History  Diagnosis Date  . Thyroid disease   . Allergy     Past Surgical History  Procedure Laterality Date  . Cholecystectomy  2007    History   Social History  . Marital Status: Married    Spouse Name: N/A    Number of Children: N/A  . Years of Education: N/A   Occupational History  . Not on file.   Social History Main Topics  . Smoking status: Former Games developer  . Smokeless tobacco: Never Used     Comment: quit at age 32  . Alcohol Use: Yes     Comment: rare  . Drug Use: No  . Sexual Activity: Not on file   Other Topics Concern  . Not on file   Social History Narrative  . No narrative on file    Family History  Problem Relation Age of Onset  . Cancer Mother 93  . Hyperlipidemia Father   . Hypertension Father   . Cancer Maternal Grandmother 45    ovarian cancer    Allergies  Allergen Reactions  . Sulfa Antibiotics   . Tuberculin Tests     Medication list reviewed and updated in full in  Link.  ROS: GEN:  no fevers, chills. GI: No n/v/d, eating normally Otherwise, ROS is as per the HPI.  PHYSICAL EXAM  Filed Vitals:   01/22/13 0840  BP: 108/74  Pulse: 88  Temp: 98.2 F (36.8 C)  TempSrc: Oral  Height: 5' 8.5" (1.74 m)  Weight: 253 lb 4 oz (114.873 kg)    SpO2: 98%    GEN: WDWN, A&Ox4,NAD. Non-toxic HEENT: Atraumatc, normocephalic. CV: RRR, No M/G/R PULM: CTA B, No wheezes, crackles, or rhonchi ABD: S, NT, ND, +BS, no rebound. No CVAT. No suprapubic tenderness. EXT: No c/c/e  A/P: UTI. Rx with ABX as below   Orders Today:  No orders of the defined types were placed in this encounter.    New medications, updates to list, dose adjustments: Meds ordered this encounter  Medications  . ciprofloxacin (CIPRO) 250 MG tablet    Sig: Take 1 tablet (250 mg total) by mouth 2 (two) times daily.    Dispense:  14 tablet    Refill:  0    Signed,  Engelbert Sevin T. Kishaun Erekson, MD, CAQ Sports Medicine  [facility] [address] [place] [address] Phone: [phone] Fax: [phone]  Updated Complete Medication List:   Medication List       This list is accurate as of: 01/22/13  8:58 AM.  Always use your most recent med list.               cetirizine 10 MG tablet  Commonly known as:  ZYRTEC  Take 10 mg  by mouth daily.     ciprofloxacin 250 MG tablet  Commonly known as:  CIPRO  Take 1 tablet (250 mg total) by mouth 2 (two) times daily.     ferrous fumarate 325 (106 FE) MG Tabs tablet  Commonly known as:  HEMOCYTE - 106 mg FE  Take 1 tablet by mouth.     guaiFENesin 100 MG/5ML liquid  Commonly known as:  ROBITUSSIN  Take 200 mg by mouth every 4 (four) hours as needed for cough.     levonorgestrel-ethinyl estradiol 0.15-0.03 MG tablet  Commonly known as:  SEASONALE  Take 1 tablet by mouth daily.     levothyroxine 50 MCG tablet  Commonly known as:  SYNTHROID, LEVOTHROID  TAKE 1 TABLET BY MOUTH EVERY DAY* NEEDS APPOINTMENT WITH DR FOR ADDITIONAL REFILLS*

## 2013-01-22 NOTE — Progress Notes (Signed)
Pre-visit discussion using our clinic review tool. No additional management support is needed unless otherwise documented below in the visit note.  

## 2013-03-10 ENCOUNTER — Other Ambulatory Visit: Payer: Self-pay | Admitting: Family Medicine

## 2013-03-10 NOTE — Telephone Encounter (Signed)
Left message on pts vm. Per previous refill, ov is needed before additional refills can be granted

## 2013-03-11 NOTE — Telephone Encounter (Signed)
Left message on pts machine requesting a cb

## 2013-03-12 NOTE — Telephone Encounter (Signed)
Attempted to contact pt; vm is now full

## 2013-03-14 NOTE — Telephone Encounter (Signed)
LM on pts home requesting a call to discuss. Letter mailed requesting pt contact office

## 2013-03-17 ENCOUNTER — Other Ambulatory Visit: Payer: Self-pay | Admitting: *Deleted

## 2013-03-17 MED ORDER — LEVOTHYROXINE SODIUM 50 MCG PO TABS: 50.0000 ug | ORAL_TABLET | Freq: Every day | ORAL | Status: DC

## 2013-03-18 ENCOUNTER — Ambulatory Visit (INDEPENDENT_AMBULATORY_CARE_PROVIDER_SITE_OTHER): Payer: BC Managed Care – PPO | Admitting: Family Medicine

## 2013-03-18 ENCOUNTER — Encounter: Payer: Self-pay | Admitting: Family Medicine

## 2013-03-18 VITALS — BP 126/84 | HR 98 | Temp 97.7°F | Ht 68.5 in | Wt 256.0 lb

## 2013-03-18 DIAGNOSIS — E781 Pure hyperglyceridemia: Secondary | ICD-10-CM

## 2013-03-18 DIAGNOSIS — E079 Disorder of thyroid, unspecified: Secondary | ICD-10-CM

## 2013-03-18 DIAGNOSIS — E669 Obesity, unspecified: Secondary | ICD-10-CM

## 2013-03-18 LAB — TSH: TSH: 2.74 u[IU]/mL (ref 0.35–5.50)

## 2013-03-18 LAB — T4, FREE: Free T4: 0.75 ng/dL (ref 0.60–1.60)

## 2013-03-18 MED ORDER — LEVOTHYROXINE SODIUM 50 MCG PO TABS: 50.0000 ug | ORAL_TABLET | Freq: Every day | ORAL | Status: DC

## 2013-03-18 NOTE — Assessment & Plan Note (Signed)
Recheck labs today. Rx refilled but may need to adjust dose of synthroid. Pt aware.

## 2013-03-18 NOTE — Progress Notes (Signed)
Pre-visit discussion using our clinic review tool. No additional management support is needed unless otherwise documented below in the visit note.  

## 2013-03-18 NOTE — Patient Instructions (Signed)
Good to see you. We will call you with your lab results.   

## 2013-03-18 NOTE — Progress Notes (Signed)
Subjective:    Patient ID: Vanessa Adams, female    DOB: [birth year], 28 y.o.   MRN: [id number]  HPI  28 yo here for follow up hypothyroidism.   Hypothyroidism- on synthroid 50 mcg daily.  TSH elevated, Ft 4 wnl.  She still feels tired. Denies any other symptoms of hypothyroidism. Lab Results  Component Value Date   TSH 10.46* 10/28/2012      Patient Active Problem List   Diagnosis Date Noted  . Hypertriglyceridemia 11/04/2012  . Obesity 06/02/2011  . Thyroid disease    Past Medical History  Diagnosis Date  . Thyroid disease   . Allergy    Past Surgical History  Procedure Laterality Date  . Cholecystectomy  2007   History  Substance Use Topics  . Smoking status: Former Games developer  . Smokeless tobacco: Never Used     Comment: quit at age 20  . Alcohol Use: Yes     Comment: rare   Family History  Problem Relation Age of Onset  . Cancer Mother 47  . Hyperlipidemia Father   . Hypertension Father   . Cancer Maternal Grandmother 45    ovarian cancer   Allergies  Allergen Reactions  . Sulfa Antibiotics   . Tuberculin Tests    Current Outpatient Prescriptions on File Prior to Visit  Medication Sig Dispense Refill  . cetirizine (ZYRTEC) 10 MG tablet Take 10 mg by mouth daily.      . ferrous fumarate (HEMOCYTE - 106 MG FE) 325 (106 FE) MG TABS tablet Take 1 tablet by mouth.      Marland Kitchen guaiFENesin (ROBITUSSIN) 100 MG/5ML liquid Take 200 mg by mouth every 4 (four) hours as needed for cough.       No current facility-administered medications on file prior to visit.   The PMH, PSH, Social History, Family History, Medications, and allergies have been reviewed in [facility], and have been updated if relevant.  ROS: See HPI  Patient Active Problem List   Diagnosis Date Noted  . Hypertriglyceridemia 11/04/2012  . Obesity 06/02/2011  . Thyroid disease    Past Medical History  Diagnosis Date  . Thyroid disease   . Allergy    Past Surgical History  Procedure Laterality  Date  . Cholecystectomy  2007   History  Substance Use Topics  . Smoking status: Former Games developer  . Smokeless tobacco: Never Used     Comment: quit at age 39  . Alcohol Use: Yes     Comment: rare   Family History  Problem Relation Age of Onset  . Cancer Mother 22  . Hyperlipidemia Father   . Hypertension Father   . Cancer Maternal Grandmother 45    ovarian cancer   Allergies  Allergen Reactions  . Sulfa Antibiotics   . Tuberculin Tests    Current Outpatient Prescriptions on File Prior to Visit  Medication Sig Dispense Refill  . cetirizine (ZYRTEC) 10 MG tablet Take 10 mg by mouth daily.      . ferrous fumarate (HEMOCYTE - 106 MG FE) 325 (106 FE) MG TABS tablet Take 1 tablet by mouth.      Marland Kitchen guaiFENesin (ROBITUSSIN) 100 MG/5ML liquid Take 200 mg by mouth every 4 (four) hours as needed for cough.       No current facility-administered medications on file prior to visit.   The PMH, PSH, Social History, Family History, Medications, and allergies have been reviewed in [facility], and have been updated if relevant.    Review of Systems  See HPI Patient reports no  vision/ hearing changes,anorexia, focal weakness, depression, anxiety    Objective:   Physical Exam BP 126/84  Pulse 98  Temp(Src) 97.7 F (36.5 C) (Oral)  Ht 5' 8.5" (1.74 m)  Wt 256 lb (116.121 kg)  BMI 38.35 kg/m2  SpO2 99%  LMP 03/13/2013  General:  Well-developed,well-nourished,in no acute distress; alert,appropriate and cooperative throughout examination Head:  normocephalic and atraumatic.   Left facial droop, can close eyes bilaterally Eyes:  vision grossly intact, pupils equal, pupils round, and pupils reactive to light.   Ears:  R ear normal and L ear normal.   Nose:  no external deformity.   Mouth:  good dentition.   Neck:  No deformities, masses, or tenderness noted. Msk:  No deformity or scoliosis noted of thoracic or lumbar spine.   Extremities:  No clubbing, cyanosis, edema, or deformity noted  with normal full range of motion of all joints.   Neurologic:  alert & oriented X3 and gait normal.   Skin:  Intact without suspicious lesions or rashes Psych:  Cognition and judgment appear intact. Alert and cooperative with normal attention span and concentration. No apparent delusions, illusions, hallucinations       Assessment & Plan:

## 2013-03-19 ENCOUNTER — Encounter: Payer: Self-pay | Admitting: *Deleted

## 2013-05-02 ENCOUNTER — Telehealth: Payer: Self-pay | Admitting: Family Medicine

## 2013-05-02 DIAGNOSIS — Z630 Problems in relationship with spouse or partner: Secondary | ICD-10-CM
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
## 2013-05-02 NOTE — Telephone Encounter (Signed)
Shirlee LimerickMarion, do you know who we can refer to for this?

## 2013-05-05 NOTE — Telephone Encounter (Signed)
Thank you :)

## 2013-05-09 ENCOUNTER — Ambulatory Visit (INDEPENDENT_AMBULATORY_CARE_PROVIDER_SITE_OTHER): Payer: BC Managed Care – PPO | Admitting: Psychology

## 2013-05-09 DIAGNOSIS — Z7189 Other specified counseling: Secondary | ICD-10-CM

## 2013-05-09 DIAGNOSIS — F4322 Adjustment disorder with anxiety: Secondary | ICD-10-CM

## 2013-05-09 DIAGNOSIS — Z630 Problems in relationship with spouse or partner: Secondary | ICD-10-CM

## 2013-05-22 ENCOUNTER — Ambulatory Visit (INDEPENDENT_AMBULATORY_CARE_PROVIDER_SITE_OTHER): Payer: BC Managed Care – PPO | Admitting: Psychology

## 2013-05-22 DIAGNOSIS — F4323 Adjustment disorder with mixed anxiety and depressed mood: Secondary | ICD-10-CM

## 2013-05-29 ENCOUNTER — Ambulatory Visit (INDEPENDENT_AMBULATORY_CARE_PROVIDER_SITE_OTHER): Payer: BC Managed Care – PPO | Admitting: Psychology

## 2013-05-29 DIAGNOSIS — F4322 Adjustment disorder with anxiety: Secondary | ICD-10-CM

## 2013-05-29 DIAGNOSIS — Z630 Problems in relationship with spouse or partner: Secondary | ICD-10-CM

## 2013-05-29 DIAGNOSIS — Z7189 Other specified counseling: Secondary | ICD-10-CM

## 2013-05-29 DIAGNOSIS — F4323 Adjustment disorder with mixed anxiety and depressed mood: Secondary | ICD-10-CM

## 2013-05-30 ENCOUNTER — Encounter: Payer: Self-pay | Admitting: Internal Medicine

## 2013-05-30 ENCOUNTER — Ambulatory Visit (INDEPENDENT_AMBULATORY_CARE_PROVIDER_SITE_OTHER): Payer: BC Managed Care – PPO | Admitting: Internal Medicine

## 2013-05-30 VITALS — BP 128/76 | HR 93 | Temp 97.8°F | Wt 254.8 lb

## 2013-05-30 DIAGNOSIS — J329 Chronic sinusitis, unspecified: Secondary | ICD-10-CM

## 2013-05-30 MED ORDER — CEFUROXIME AXETIL 500 MG PO TABS: 500.0000 mg | ORAL_TABLET | Freq: Two times a day (BID) | ORAL | Status: DC

## 2013-05-30 NOTE — Progress Notes (Signed)
Pre visit review using our clinic review tool, if applicable. No additional management support is needed unless otherwise documented below in the visit note. 

## 2013-05-30 NOTE — Progress Notes (Addendum)
HPI  Pt presents to the clinic today with c/o facial pain and pressure. She reports this started 5 days ago. She has also noticed some swelling on the left side of her face. The mucous is yellow/green. She has tried OTC afrin, saline, benadryl, sudafed and zyrtec with no relief. She does have a history of allergies but denies breathing problems. She has not had sick contacts that she is aware of. She reports she is allergic to PCN- it causes anaphylaxis. Upon further questioning, she does not think she has ever had PCN, but both of her parents are allergic to it.  Review of Systems    Past Medical History  Diagnosis Date  . Thyroid disease   . Allergy   . Bell palsy     Family History  Problem Relation Age of Onset  . Cancer Mother 7645  . Hyperlipidemia Father   . Hypertension Father   . Cancer Maternal Grandmother 45    ovarian cancer    History   Social History  . Marital Status: Married    Spouse Name: N/A    Number of Children: N/A  . Years of Education: N/A   Occupational History  . Not on file.   Social History Main Topics  . Smoking status: Former Games developermoker  . Smokeless tobacco: Never Used     Comment: quit at age 28  . Alcohol Use: Yes     Comment: rare  . Drug Use: No  . Sexual Activity: Not on file   Other Topics Concern  . Not on file   Social History Narrative  . No narrative on file    Allergies  Allergen Reactions  . Sulfa Antibiotics   . Tuberculin Tests      Constitutional: Positive headache, fatigue. Denies fever or abrupt weight changes.  HEENT:  Positive eye pain, pressure behind the eyes, facial pain, nasal congestion and sore throat. Denies eye redness, ear pain, ringing in the ears, wax buildup, runny nose or bloody nose. Respiratory: Positive cough. Denies difficulty breathing or shortness of breath.  Cardiovascular: Denies chest pain, chest tightness, palpitations or swelling in the hands or feet.   No other specific complaints in a  complete review of systems (except as listed in HPI above).  Objective:   BP 128/76  Pulse 93  Temp(Src) 97.8 F (36.6 C) (Oral)  Wt 254 lb 12 oz (115.554 kg)  SpO2 98%  General: Appears her stated age, well developed, well nourished in NAD. HEENT: Head: normal shape and size, maxillary sinus tenderness noted; Eyes: sclera white, no icterus, conjunctiva pink, PERRLA and EOMs intact; Ears: Tm's gray and intact, normal light reflex; Nose: mucosa pink and moist, septum midline; Throat/Mouth: + PND. Teeth present, mucosa pink and moist, no exudate noted, no lesions or ulcerations noted.  Neck: Neck supple, trachea midline. No massses, lumps or thyromegaly present.  Cardiovascular: Normal rate and rhythm. S1,S2 noted.  No murmur, rubs or gallops noted. No JVD or BLE edema. No carotid bruits noted. Pulmonary/Chest: Normal effort and positive vesicular breath sounds. No respiratory distress. No wheezes, rales or ronchi noted.      Assessment & Plan:   Acute bacterial sinusitis  Can use a Neti Pot which can be purchased from your local drug store. Ceftin BID x 10 days Continue zyrtec and flonase  RTC as needed or if symptoms persist.

## 2013-05-30 NOTE — Patient Instructions (Addendum)

## 2013-06-06 ENCOUNTER — Ambulatory Visit (INDEPENDENT_AMBULATORY_CARE_PROVIDER_SITE_OTHER): Payer: BC Managed Care – PPO | Admitting: Psychology

## 2013-06-06 DIAGNOSIS — F4322 Adjustment disorder with anxiety: Secondary | ICD-10-CM

## 2013-06-06 DIAGNOSIS — Z7189 Other specified counseling: Secondary | ICD-10-CM

## 2013-06-06 DIAGNOSIS — Z630 Problems in relationship with spouse or partner: Secondary | ICD-10-CM

## 2013-06-12 ENCOUNTER — Ambulatory Visit: Payer: BC Managed Care – PPO | Admitting: Psychology

## 2013-06-13 ENCOUNTER — Ambulatory Visit: Payer: BC Managed Care – PPO | Admitting: Psychology

## 2013-06-25 ENCOUNTER — Ambulatory Visit (INDEPENDENT_AMBULATORY_CARE_PROVIDER_SITE_OTHER): Payer: BC Managed Care – PPO | Admitting: Psychology

## 2013-06-25 DIAGNOSIS — Z630 Problems in relationship with spouse or partner: Secondary | ICD-10-CM

## 2013-06-25 DIAGNOSIS — Z7189 Other specified counseling: Secondary | ICD-10-CM

## 2013-06-25 DIAGNOSIS — F4323 Adjustment disorder with mixed anxiety and depressed mood: Secondary | ICD-10-CM

## 2013-07-02 ENCOUNTER — Ambulatory Visit: Payer: BC Managed Care – PPO | Admitting: Psychology

## 2013-07-04 ENCOUNTER — Ambulatory Visit (INDEPENDENT_AMBULATORY_CARE_PROVIDER_SITE_OTHER): Payer: BC Managed Care – PPO | Admitting: Psychology

## 2013-07-04 DIAGNOSIS — Z630 Problems in relationship with spouse or partner: Secondary | ICD-10-CM

## 2013-07-04 DIAGNOSIS — Z7189 Other specified counseling: Secondary | ICD-10-CM

## 2013-07-04 DIAGNOSIS — F4323 Adjustment disorder with mixed anxiety and depressed mood: Secondary | ICD-10-CM

## 2013-07-07 ENCOUNTER — Ambulatory Visit (INDEPENDENT_AMBULATORY_CARE_PROVIDER_SITE_OTHER): Payer: BC Managed Care – PPO | Admitting: Psychology

## 2013-07-07 DIAGNOSIS — Z7189 Other specified counseling: Secondary | ICD-10-CM

## 2013-07-07 DIAGNOSIS — F4323 Adjustment disorder with mixed anxiety and depressed mood: Secondary | ICD-10-CM

## 2013-07-07 DIAGNOSIS — Z630 Problems in relationship with spouse or partner: Secondary | ICD-10-CM

## 2013-07-09 ENCOUNTER — Ambulatory Visit (INDEPENDENT_AMBULATORY_CARE_PROVIDER_SITE_OTHER): Payer: BC Managed Care – PPO | Admitting: Psychology

## 2013-07-09 DIAGNOSIS — Z630 Problems in relationship with spouse or partner: Secondary | ICD-10-CM

## 2013-07-09 DIAGNOSIS — Z7189 Other specified counseling: Secondary | ICD-10-CM

## 2013-07-09 DIAGNOSIS — F4322 Adjustment disorder with anxiety: Secondary | ICD-10-CM

## 2013-07-14 ENCOUNTER — Ambulatory Visit (INDEPENDENT_AMBULATORY_CARE_PROVIDER_SITE_OTHER): Payer: BC Managed Care – PPO | Admitting: Psychology

## 2013-07-14 DIAGNOSIS — F4323 Adjustment disorder with mixed anxiety and depressed mood: Secondary | ICD-10-CM

## 2013-07-14 DIAGNOSIS — Z7189 Other specified counseling: Secondary | ICD-10-CM

## 2013-07-14 DIAGNOSIS — Z630 Problems in relationship with spouse or partner: Secondary | ICD-10-CM

## 2013-07-16 ENCOUNTER — Ambulatory Visit (INDEPENDENT_AMBULATORY_CARE_PROVIDER_SITE_OTHER): Payer: BC Managed Care – PPO | Admitting: Psychology

## 2013-07-16 DIAGNOSIS — Z630 Problems in relationship with spouse or partner: Secondary | ICD-10-CM

## 2013-07-16 DIAGNOSIS — Z7189 Other specified counseling: Secondary | ICD-10-CM

## 2013-07-16 DIAGNOSIS — F4323 Adjustment disorder with mixed anxiety and depressed mood: Secondary | ICD-10-CM

## 2013-07-18 ENCOUNTER — Ambulatory Visit: Payer: BC Managed Care – PPO | Admitting: Psychology

## 2013-07-21 ENCOUNTER — Ambulatory Visit (INDEPENDENT_AMBULATORY_CARE_PROVIDER_SITE_OTHER): Payer: BC Managed Care – PPO | Admitting: Psychology

## 2013-07-21 DIAGNOSIS — F4321 Adjustment disorder with depressed mood: Secondary | ICD-10-CM

## 2013-07-23 ENCOUNTER — Ambulatory Visit (INDEPENDENT_AMBULATORY_CARE_PROVIDER_SITE_OTHER): Payer: BC Managed Care – PPO | Admitting: Psychology

## 2013-07-23 DIAGNOSIS — Z7189 Other specified counseling: Secondary | ICD-10-CM

## 2013-07-23 DIAGNOSIS — F4323 Adjustment disorder with mixed anxiety and depressed mood: Secondary | ICD-10-CM

## 2013-07-23 DIAGNOSIS — Z630 Problems in relationship with spouse or partner: Secondary | ICD-10-CM

## 2013-07-25 ENCOUNTER — Ambulatory Visit (INDEPENDENT_AMBULATORY_CARE_PROVIDER_SITE_OTHER): Payer: BC Managed Care – PPO | Admitting: Psychology

## 2013-07-25 DIAGNOSIS — Z7189 Other specified counseling: Secondary | ICD-10-CM

## 2013-07-25 DIAGNOSIS — Z630 Problems in relationship with spouse or partner: Secondary | ICD-10-CM

## 2013-07-25 DIAGNOSIS — F4323 Adjustment disorder with mixed anxiety and depressed mood: Secondary | ICD-10-CM

## 2013-07-28 ENCOUNTER — Ambulatory Visit (INDEPENDENT_AMBULATORY_CARE_PROVIDER_SITE_OTHER): Payer: BC Managed Care – PPO | Admitting: Psychology

## 2013-07-28 DIAGNOSIS — Z630 Problems in relationship with spouse or partner: Secondary | ICD-10-CM

## 2013-07-28 DIAGNOSIS — F4323 Adjustment disorder with mixed anxiety and depressed mood: Secondary | ICD-10-CM

## 2013-07-28 DIAGNOSIS — Z7189 Other specified counseling: Secondary | ICD-10-CM

## 2013-10-18 ENCOUNTER — Other Ambulatory Visit: Payer: Self-pay | Admitting: Family Medicine

## 2013-12-10 ENCOUNTER — Other Ambulatory Visit: Payer: Self-pay | Admitting: Family Medicine

## 2013-12-10 NOTE — Telephone Encounter (Signed)
Pt requesting medication refill. Medication not showing on pts current list. Last f/u appt 03/2013 and last CPE 10/2012, pls advise

## 2014-01-13 ENCOUNTER — Ambulatory Visit (INDEPENDENT_AMBULATORY_CARE_PROVIDER_SITE_OTHER): Payer: BC Managed Care – PPO | Admitting: Psychology

## 2014-01-13 DIAGNOSIS — F4322 Adjustment disorder with anxiety: Secondary | ICD-10-CM

## 2014-01-13 DIAGNOSIS — Z7189 Other specified counseling: Secondary | ICD-10-CM

## 2014-01-19 ENCOUNTER — Ambulatory Visit: Payer: BC Managed Care – PPO | Admitting: Psychology

## 2014-01-20 ENCOUNTER — Ambulatory Visit (INDEPENDENT_AMBULATORY_CARE_PROVIDER_SITE_OTHER): Payer: BC Managed Care – PPO | Admitting: Psychology

## 2014-01-20 DIAGNOSIS — Z7189 Other specified counseling: Secondary | ICD-10-CM

## 2014-01-20 DIAGNOSIS — F4322 Adjustment disorder with anxiety: Secondary | ICD-10-CM

## 2014-01-27 ENCOUNTER — Ambulatory Visit (INDEPENDENT_AMBULATORY_CARE_PROVIDER_SITE_OTHER): Payer: BC Managed Care – PPO | Admitting: Psychology

## 2014-01-27 DIAGNOSIS — F4322 Adjustment disorder with anxiety: Secondary | ICD-10-CM

## 2014-01-27 DIAGNOSIS — Z7189 Other specified counseling: Secondary | ICD-10-CM

## 2014-02-02 ENCOUNTER — Other Ambulatory Visit: Payer: Self-pay | Admitting: Family Medicine

## 2014-02-10 ENCOUNTER — Ambulatory Visit: Payer: BC Managed Care – PPO | Admitting: Psychology

## 2014-02-20 ENCOUNTER — Ambulatory Visit (INDEPENDENT_AMBULATORY_CARE_PROVIDER_SITE_OTHER): Payer: BC Managed Care – PPO | Admitting: Psychology

## 2014-02-20 DIAGNOSIS — Z7189 Other specified counseling: Secondary | ICD-10-CM

## 2014-02-20 DIAGNOSIS — F4322 Adjustment disorder with anxiety: Secondary | ICD-10-CM

## 2014-02-25 ENCOUNTER — Ambulatory Visit (INDEPENDENT_AMBULATORY_CARE_PROVIDER_SITE_OTHER): Payer: BC Managed Care – PPO | Admitting: Family Medicine

## 2014-02-25 ENCOUNTER — Encounter: Payer: Self-pay | Admitting: Family Medicine

## 2014-02-25 VITALS — BP 120/70 | HR 91 | Temp 98.2°F | Wt 245.0 lb

## 2014-02-25 DIAGNOSIS — F32A Depression, unspecified: Secondary | ICD-10-CM

## 2014-02-25 DIAGNOSIS — F418 Other specified anxiety disorders: Secondary | ICD-10-CM

## 2014-02-25 DIAGNOSIS — F419 Anxiety disorder, unspecified: Principal | ICD-10-CM

## 2014-02-25 DIAGNOSIS — F329 Major depressive disorder, single episode, unspecified: Secondary | ICD-10-CM

## 2014-02-25 MED ORDER — SERTRALINE HCL 25 MG PO TABS: 25.0000 mg | ORAL_TABLET | Freq: Every day | ORAL | Status: DC

## 2014-02-25 NOTE — Assessment & Plan Note (Signed)
New-  >25 minutes spent in face to face time with patient, >50% spent in counselling or coordination of care. She is followed by Dr. Dellia CloudGutterman which is reassuring since she is not very forthcoming with me today. Start zoloft 25 mg daily. Discussed possible side effects.  Call me with an update in 3 weeks.

## 2014-02-25 NOTE — Progress Notes (Signed)
Pre visit review using our clinic review tool, if applicable. No additional management support is needed unless otherwise documented below in the visit note. 

## 2014-02-25 NOTE — Progress Notes (Signed)
Subjective:   Patient ID: Vanessa Adams, female    DOB: 1985-03-21, 28 y.o.   MRN: [id number]  Vanessa Adams is a pleasant 28 y.o. year old female who presents to clinic today with Follow-up  on 02/25/2014  HPI: Symptoms of anxiety and depression, worsening over past few months. Has been seeing Dr. Dellia CloudGutterman.  Per pt, he suggested that I prescribe rx. Upon questioning of whether or not this is situational or what is going on, she refusing to discuss with me.  She does not discuss her symptoms other than saying "I said I am depressed."  She denies SI or HI. Does have h/o OCD and has been feeling more anxious.   Again, she will not describe which situations trigger symptoms or truly what her symptoms are.  Has not been on any anxiolytics or antidepressants since she was 28 yo.  When I asked which rx she was on and if she remembers an adverse rx, she says "I don't know.  I was 14!"  She does say that she has support.  Current Outpatient Prescriptions on File Prior to Visit  Medication Sig Dispense Refill  . ferrous fumarate (HEMOCYTE - 106 MG FE) 325 (106 FE) MG TABS tablet Take 1 tablet by mouth.    . levonorgestrel-ethinyl estradiol (SEASONALE,INTROVALE,JOLESSA) 0.15-0.03 MG tablet Take 1 tablet by mouth daily.    Marland Kitchen. levothyroxine (SYNTHROID, LEVOTHROID) 50 MCG tablet TAKE 1 TABLET BY MOUTH EVERY MORNING BEFORE A MEAL 30 tablet 0  . QUASENSE 0.15-0.03 MG tablet TAKE 1 TABLET BY MOUTH ONCE DAILY 28 tablet 0   No current facility-administered medications on file prior to visit.    Allergies  Allergen Reactions  . Sulfa Antibiotics   . Tuberculin Tests     Past Medical History  Diagnosis Date  . Thyroid disease   . Allergy   . Bell palsy     Past Surgical History  Procedure Laterality Date  . Cholecystectomy  2007    Family History  Problem Relation Age of Onset  . Cancer Mother 2445  . Hyperlipidemia Father   . Hypertension Father   . Cancer Maternal  Grandmother 45    ovarian cancer    History   Social History  . Marital Status: Married    Spouse Name: N/A    Number of Children: N/A  . Years of Education: N/A   Occupational History  . Not on file.   Social History Main Topics  . Smoking status: Former Games developermoker  . Smokeless tobacco: Never Used     Comment: quit at age 28  . Alcohol Use: Yes     Comment: rare  . Drug Use: No  . Sexual Activity: Not on file   Other Topics Concern  . Not on file   Social History Narrative   The PMH, PSH, Social History, Family History, Medications, and allergies have been reviewed in [facility], and have been updated if relevant.  Review of Systems See HPI- not answering most questions asked    Objective:    BP 120/70 mmHg  Pulse 91  Temp(Src) 98.2 F (36.8 C) (Oral)  Wt 245 lb (111.131 kg)  SpO2 98%   Physical Exam  Constitutional: She is oriented to person, place, and time. She appears well-developed and well-nourished. No distress.  HENT:  Head: Normocephalic.  Neurological: She is alert and oriented to person, place, and time.  Skin: Skin is warm and dry.  Psychiatric:  Good eye contact, anxious, flat affect  Nursing note and vitals reviewed.         Assessment & Plan:   Anxiety and depression No Follow-up on file.

## 2014-02-25 NOTE — Patient Instructions (Signed)
Good to see you. We are starting zoloft 25 mg nightly  Please call me with an update in 3 weeks.

## 2014-03-09 ENCOUNTER — Other Ambulatory Visit: Payer: Self-pay | Admitting: Family Medicine

## 2014-03-12 ENCOUNTER — Telehealth: Payer: Self-pay

## 2014-03-12 NOTE — Telephone Encounter (Signed)
Pt left v/m; pt was seen 02/25/14; pt does not think the zoloft 25 mg is helping and pt request to increase to zoloft 50 mg. [facility].Please advise.

## 2014-03-16 MED ORDER — SERTRALINE HCL 25 MG PO TABS: 50.0000 mg | ORAL_TABLET | Freq: Every day | ORAL | Status: DC

## 2014-03-16 NOTE — Telephone Encounter (Signed)
pt left v/m requesting cb about increasing zoloft.Please advise.

## 2014-03-16 NOTE — Telephone Encounter (Signed)
Ok to increase rx as requested. eRx sent to [facility]. Please update [place] in 1 month.

## 2014-03-23 ENCOUNTER — Ambulatory Visit (INDEPENDENT_AMBULATORY_CARE_PROVIDER_SITE_OTHER): Payer: BLUE CROSS/BLUE SHIELD | Admitting: Psychology

## 2014-03-23 DIAGNOSIS — Z7189 Other specified counseling: Secondary | ICD-10-CM

## 2014-03-23 DIAGNOSIS — F4322 Adjustment disorder with anxiety: Secondary | ICD-10-CM

## 2014-04-01 ENCOUNTER — Other Ambulatory Visit: Payer: Self-pay | Admitting: Family Medicine

## 2014-04-03 ENCOUNTER — Ambulatory Visit: Payer: Self-pay | Admitting: Psychology

## 2014-04-07 ENCOUNTER — Ambulatory Visit: Payer: BLUE CROSS/BLUE SHIELD | Admitting: Psychology

## 2014-04-15 ENCOUNTER — Ambulatory Visit: Payer: BLUE CROSS/BLUE SHIELD | Admitting: Family Medicine

## 2014-04-16 NOTE — Telephone Encounter (Signed)
Pt is doing well on zoloft 50 mg and pt request new rx for zoloft 50 mg for at least 30 day rx to [facility]. Pt request cb.

## 2014-04-17 MED ORDER — SERTRALINE HCL 50 MG PO TABS: 50.0000 mg | ORAL_TABLET | Freq: Every day | ORAL | Status: DC

## 2014-04-17 NOTE — Telephone Encounter (Signed)
That is great.  eRx sent.

## 2014-04-17 NOTE — Addendum Note (Signed)
Addended by: Dianne DunARON, TALIA M on: 04/17/2014 09:46 AM   Modules accepted: Orders

## 2014-05-15 ENCOUNTER — Other Ambulatory Visit: Payer: Self-pay | Admitting: Family Medicine

## 2014-06-17 ENCOUNTER — Ambulatory Visit (INDEPENDENT_AMBULATORY_CARE_PROVIDER_SITE_OTHER): Payer: BLUE CROSS/BLUE SHIELD | Admitting: Psychology

## 2014-06-17 DIAGNOSIS — F4322 Adjustment disorder with anxiety: Secondary | ICD-10-CM | POA: Diagnosis not present

## 2014-06-17 DIAGNOSIS — Z7189 Other specified counseling: Secondary | ICD-10-CM

## 2014-06-24 ENCOUNTER — Ambulatory Visit: Payer: BLUE CROSS/BLUE SHIELD | Admitting: Psychology

## 2014-06-25 ENCOUNTER — Ambulatory Visit (INDEPENDENT_AMBULATORY_CARE_PROVIDER_SITE_OTHER): Payer: BLUE CROSS/BLUE SHIELD | Admitting: Psychology

## 2014-06-25 DIAGNOSIS — Z7189 Other specified counseling: Secondary | ICD-10-CM | POA: Diagnosis not present

## 2014-06-25 DIAGNOSIS — F4322 Adjustment disorder with anxiety: Secondary | ICD-10-CM

## 2014-07-09 ENCOUNTER — Ambulatory Visit: Payer: BLUE CROSS/BLUE SHIELD | Admitting: Psychology

## 2014-08-04 ENCOUNTER — Ambulatory Visit (INDEPENDENT_AMBULATORY_CARE_PROVIDER_SITE_OTHER): Payer: BLUE CROSS/BLUE SHIELD | Admitting: Psychology

## 2014-08-04 DIAGNOSIS — F4322 Adjustment disorder with anxiety: Secondary | ICD-10-CM

## 2014-08-04 DIAGNOSIS — Z7189 Other specified counseling: Secondary | ICD-10-CM | POA: Diagnosis not present

## 2014-08-18 ENCOUNTER — Ambulatory Visit (INDEPENDENT_AMBULATORY_CARE_PROVIDER_SITE_OTHER): Payer: BLUE CROSS/BLUE SHIELD | Admitting: Psychology

## 2014-08-18 DIAGNOSIS — F3181 Bipolar II disorder: Secondary | ICD-10-CM | POA: Diagnosis not present

## 2014-08-30 ENCOUNTER — Other Ambulatory Visit: Payer: Self-pay | Admitting: Family Medicine

## 2014-09-01 ENCOUNTER — Ambulatory Visit: Payer: BLUE CROSS/BLUE SHIELD | Admitting: Psychology

## 2014-09-22 ENCOUNTER — Ambulatory Visit (INDEPENDENT_AMBULATORY_CARE_PROVIDER_SITE_OTHER): Payer: BLUE CROSS/BLUE SHIELD | Admitting: Psychology

## 2014-09-22 DIAGNOSIS — F4322 Adjustment disorder with anxiety: Secondary | ICD-10-CM | POA: Diagnosis not present

## 2014-10-27 ENCOUNTER — Ambulatory Visit (INDEPENDENT_AMBULATORY_CARE_PROVIDER_SITE_OTHER): Payer: BLUE CROSS/BLUE SHIELD | Admitting: Psychology

## 2014-10-27 DIAGNOSIS — F4322 Adjustment disorder with anxiety: Secondary | ICD-10-CM

## 2014-10-27 DIAGNOSIS — Z7189 Other specified counseling: Secondary | ICD-10-CM

## 2014-11-02 ENCOUNTER — Ambulatory Visit: Payer: BLUE CROSS/BLUE SHIELD | Admitting: Family Medicine

## 2014-11-02 ENCOUNTER — Telehealth: Payer: Self-pay | Admitting: Family Medicine

## 2014-11-02 DIAGNOSIS — Z0289 Encounter for other administrative examinations: Secondary | ICD-10-CM

## 2014-11-02 NOTE — Telephone Encounter (Signed)
Pt did not come in for their appt today for the acute visit. Please let me know if pt needs to be contacted immediately for follow up or no follow up needed. Best phone number to contact pt is [phone].

## 2014-11-26 ENCOUNTER — Ambulatory Visit: Payer: BLUE CROSS/BLUE SHIELD | Admitting: Psychology

## 2014-12-02 ENCOUNTER — Other Ambulatory Visit: Payer: Self-pay | Admitting: Family Medicine

## 2014-12-03 NOTE — Telephone Encounter (Signed)
Pt NSH her last 2 appts

## 2014-12-09 ENCOUNTER — Ambulatory Visit: Payer: BLUE CROSS/BLUE SHIELD | Admitting: Psychology

## 2014-12-28 ENCOUNTER — Ambulatory Visit (INDEPENDENT_AMBULATORY_CARE_PROVIDER_SITE_OTHER): Payer: BLUE CROSS/BLUE SHIELD | Admitting: Psychology

## 2014-12-28 DIAGNOSIS — F4322 Adjustment disorder with anxiety: Secondary | ICD-10-CM | POA: Diagnosis not present

## 2014-12-28 DIAGNOSIS — Z7189 Other specified counseling: Secondary | ICD-10-CM

## 2015-01-12 ENCOUNTER — Other Ambulatory Visit: Payer: Self-pay | Admitting: Family Medicine

## 2015-01-13 ENCOUNTER — Telehealth: Payer: Self-pay

## 2015-01-13 MED ORDER — SERTRALINE HCL 100 MG PO TABS: 100.0000 mg | ORAL_TABLET | Freq: Every day | ORAL | Status: DC

## 2015-01-13 NOTE — Telephone Encounter (Signed)
Lm on pts vm and advised Rx sent to pharmacy. Pt advised f/u appt needed for additional refills and was instructed to contact office to schedule

## 2015-01-13 NOTE — Telephone Encounter (Signed)
Sertraline last refilled 12/03/14 for #30 with 0 refills. Last office visit was 02/25/14. Ok to refill?

## 2015-01-13 NOTE — Telephone Encounter (Signed)
I'm sorry to hear this but I am glad to hear she is seeing a therapist.  Let's increase zoloft to 100 mg daily. eR sent.  Please keep me updated.

## 2015-01-13 NOTE — Telephone Encounter (Signed)
Pt left v/m; pt feeling more anxious and sleeping more and pts therapist advised pt could need to change dosage of zoloft; [facility] Last seen f/u appt on 02/25/14. No future appt scheduled.

## 2015-01-25 ENCOUNTER — Ambulatory Visit (INDEPENDENT_AMBULATORY_CARE_PROVIDER_SITE_OTHER): Payer: BLUE CROSS/BLUE SHIELD | Admitting: Psychology

## 2015-01-25 DIAGNOSIS — F4322 Adjustment disorder with anxiety: Secondary | ICD-10-CM | POA: Diagnosis not present

## 2015-01-25 DIAGNOSIS — Z7189 Other specified counseling: Secondary | ICD-10-CM

## 2015-02-10 ENCOUNTER — Ambulatory Visit: Payer: BLUE CROSS/BLUE SHIELD | Admitting: Psychology

## 2015-02-24 ENCOUNTER — Ambulatory Visit: Payer: BLUE CROSS/BLUE SHIELD | Admitting: Psychology

## 2015-03-03 ENCOUNTER — Other Ambulatory Visit: Payer: Self-pay | Admitting: Family Medicine

## 2015-03-09 ENCOUNTER — Ambulatory Visit: Payer: BLUE CROSS/BLUE SHIELD | Admitting: Psychology

## 2015-03-10 ENCOUNTER — Ambulatory Visit: Payer: BLUE CROSS/BLUE SHIELD | Admitting: Psychology

## 2015-03-26 ENCOUNTER — Ambulatory Visit (INDEPENDENT_AMBULATORY_CARE_PROVIDER_SITE_OTHER): Payer: BLUE CROSS/BLUE SHIELD | Admitting: Psychology

## 2015-03-26 DIAGNOSIS — Z7189 Other specified counseling: Secondary | ICD-10-CM

## 2015-03-26 DIAGNOSIS — F4322 Adjustment disorder with anxiety: Secondary | ICD-10-CM | POA: Diagnosis not present

## 2015-04-01 ENCOUNTER — Ambulatory Visit: Payer: BLUE CROSS/BLUE SHIELD | Admitting: Psychology

## 2015-04-06 ENCOUNTER — Other Ambulatory Visit: Payer: Self-pay | Admitting: Family Medicine

## 2015-04-14 ENCOUNTER — Ambulatory Visit: Payer: BLUE CROSS/BLUE SHIELD | Admitting: Psychology

## 2015-04-26 ENCOUNTER — Ambulatory Visit: Payer: Self-pay | Admitting: Family Medicine

## 2015-05-03 ENCOUNTER — Ambulatory Visit: Payer: Self-pay | Admitting: Family Medicine

## 2015-05-03 ENCOUNTER — Telehealth: Payer: Self-pay | Admitting: Family Medicine

## 2015-05-03 NOTE — Telephone Encounter (Signed)
Patient did not come for their scheduled appointment today for med refill Please let me know if the patient needs to be contacted immediately for follow up or if no follow up is necessary.   ° °

## 2015-05-04 ENCOUNTER — Ambulatory Visit: Payer: BLUE CROSS/BLUE SHIELD | Admitting: Psychology

## 2015-05-13 ENCOUNTER — Other Ambulatory Visit: Payer: Self-pay | Admitting: Family Medicine

## 2015-05-17 ENCOUNTER — Ambulatory Visit: Payer: Self-pay | Admitting: Psychology

## 2015-05-27 ENCOUNTER — Other Ambulatory Visit: Payer: Self-pay | Admitting: Family Medicine

## 2015-05-28 NOTE — Telephone Encounter (Signed)
Pt needing OV for #90. Last OV 02/2014

## 2015-05-31 ENCOUNTER — Ambulatory Visit (INDEPENDENT_AMBULATORY_CARE_PROVIDER_SITE_OTHER): Payer: 59 | Admitting: Psychology

## 2015-05-31 DIAGNOSIS — F4322 Adjustment disorder with anxiety: Secondary | ICD-10-CM

## 2015-05-31 DIAGNOSIS — Z7189 Other specified counseling: Secondary | ICD-10-CM

## 2015-06-03 ENCOUNTER — Encounter: Payer: Self-pay | Admitting: Family Medicine

## 2015-06-03 ENCOUNTER — Ambulatory Visit (INDEPENDENT_AMBULATORY_CARE_PROVIDER_SITE_OTHER): Payer: 59 | Admitting: Family Medicine

## 2015-06-03 VITALS — BP 116/78 | HR 85 | Temp 98.2°F | Wt 258.2 lb

## 2015-06-03 DIAGNOSIS — E079 Disorder of thyroid, unspecified: Secondary | ICD-10-CM | POA: Diagnosis not present

## 2015-06-03 DIAGNOSIS — Z30011 Encounter for initial prescription of contraceptive pills: Secondary | ICD-10-CM

## 2015-06-03 DIAGNOSIS — E781 Pure hyperglyceridemia: Secondary | ICD-10-CM | POA: Diagnosis not present

## 2015-06-03 DIAGNOSIS — F32A Depression, unspecified: Secondary | ICD-10-CM

## 2015-06-03 DIAGNOSIS — F419 Anxiety disorder, unspecified: Secondary | ICD-10-CM

## 2015-06-03 DIAGNOSIS — F418 Other specified anxiety disorders: Secondary | ICD-10-CM

## 2015-06-03 DIAGNOSIS — Z309 Encounter for contraceptive management, unspecified: Secondary | ICD-10-CM | POA: Insufficient documentation

## 2015-06-03 DIAGNOSIS — F329 Major depressive disorder, single episode, unspecified: Secondary | ICD-10-CM

## 2015-06-03 LAB — LIPID PANEL
Cholesterol: 130 mg/dL (ref 0–200)
HDL: 42 mg/dL (ref >39.00)
NonHDL: 87.99
Total CHOL/HDL Ratio: 3
Triglycerides: 230 mg/dL — ABNORMAL HIGH (ref 0.0–149.0)
VLDL: 46 mg/dL — ABNORMAL HIGH (ref 0.0–40.0)

## 2015-06-03 LAB — LDL CHOLESTEROL, DIRECT: Direct LDL: 70 mg/dL

## 2015-06-03 LAB — COMPREHENSIVE METABOLIC PANEL
ALT: 17 U/L (ref 0–35)
AST: 15 U/L (ref 0–37)
Albumin: 4 g/dL (ref 3.5–5.2)
Alkaline Phosphatase: 48 U/L (ref 39–117)
BUN: 10 mg/dL (ref 6–23)
CO2: 29 mEq/L (ref 19–32)
Calcium: 9.2 mg/dL (ref 8.4–10.5)
Chloride: 104 mEq/L (ref 96–112)
Creatinine, Ser: 0.57 mg/dL (ref 0.40–1.20)
GFR: 131.28 mL/min (ref >60.00)
Glucose, Bld: 118 mg/dL — ABNORMAL HIGH (ref 70–99)
Potassium: 4 mEq/L (ref 3.5–5.1)
Sodium: 139 mEq/L (ref 135–145)
Total Bilirubin: 0.6 mg/dL (ref 0.2–1.2)
Total Protein: 7.7 g/dL (ref 6.0–8.3)

## 2015-06-03 LAB — TSH: TSH: 5.69 u[IU]/mL — ABNORMAL HIGH (ref 0.35–4.50)

## 2015-06-03 MED ORDER — LEVOTHYROXINE SODIUM 50 MCG PO TABS: ORAL_TABLET | ORAL | Status: DC

## 2015-06-03 MED ORDER — SERTRALINE HCL 100 MG PO TABS: 100.0000 mg | ORAL_TABLET | Freq: Every day | ORAL | Status: DC

## 2015-06-03 MED ORDER — LEVONORGEST-ETH ESTRAD 91-DAY 0.15-0.03 MG PO TABS: 1.0000 | ORAL_TABLET | Freq: Every day | ORAL | Status: DC

## 2015-06-03 NOTE — Assessment & Plan Note (Signed)
Labs today. Rx refilled. Has been out of rx for weeks.

## 2015-06-03 NOTE — Assessment & Plan Note (Signed)
eRx refliled.

## 2015-06-03 NOTE — Progress Notes (Signed)
Pre visit review using our clinic review tool, if applicable. No additional management support is needed unless otherwise documented below in the visit note. 

## 2015-06-03 NOTE — Progress Notes (Signed)
Subjective:   Patient ID: Vanessa Adams, female    DOB: August 15, 1985, 30 y.o.   MRN: [id number]  Vanessa Seenlexandra A Schram is a pleasant 30 y.o. year old female who presents to clinic today with Follow-up  on 06/03/2015  HPI:   Anxiety and depression- symptoms have been well controlled on zoloft 100 mg daily. Denies any recent symptoms of anxiety or depression.  Has been seeing Dr. Dellia CloudGutterman for psychotherapy.   Hypothyroidism- overdue for labs.  Was taking synthroid 50 mcg daily but ran out a few weeks ago. Lab Results  Component Value Date   TSH 2.74 03/18/2013   Contraception management- she is pleased with Seasonale.  Asking for refills today. Rarely has a period and very light when she does.  No cramping.    Current Outpatient Prescriptions on File Prior to Visit  Medication Sig Dispense Refill  . ferrous fumarate (HEMOCYTE - 106 MG FE) 325 (106 FE) MG TABS tablet Take 1 tablet by mouth.    . levonorgestrel-ethinyl estradiol (QUASENSE) 0.15-0.03 MG tablet Take 1 tablet by mouth daily. OFFICE VISIT REQUIRED FOR ADDITIONAL REFILLS 30 tablet 0  . levothyroxine (SYNTHROID, LEVOTHROID) 50 MCG tablet TAKE 1 TABLET BY MOUTH EVERY MORNING BEFORE A MEAL 30 tablet 11  . sertraline (ZOLOFT) 100 MG tablet Take 1 tablet (100 mg total) by mouth daily. OFFICE VISIT REQUIRED FOR ADDITIONAL REFILLS 30 tablet 0   No current facility-administered medications on file prior to visit.    Allergies  Allergen Reactions  . Sulfa Antibiotics   . Tuberculin Tests     Past Medical History  Diagnosis Date  . Thyroid disease   . Allergy   . Bell palsy     Past Surgical History  Procedure Laterality Date  . Cholecystectomy  2007    Family History  Problem Relation Age of Onset  . Cancer Mother 6145  . Hyperlipidemia Father   . Hypertension Father   . Cancer Maternal Grandmother 4245    ovarian cancer    Social History   Social History  . Marital Status: Married    Spouse Name: N/A  .  Number of Children: N/A  . Years of Education: N/A   Occupational History  . Not on file.   Social History Main Topics  . Smoking status: Former Games developermoker  . Smokeless tobacco: Never Used     Comment: quit at age 30  . Alcohol Use: Yes     Comment: rare  . Drug Use: No  . Sexual Activity: Not on file   Other Topics Concern  . Not on file   Social History Narrative   The PMH, PSH, Social History, Family History, Medications, and allergies have been reviewed in [facility], and have been updated if relevant.  Review of Systems  Constitutional: Positive for fatigue.  HENT: Negative.   Respiratory: Negative.   Cardiovascular: Negative.   Gastrointestinal: Negative.   Endocrine: Negative.   Genitourinary: Negative.   Musculoskeletal: Negative.   Skin: Negative.   Allergic/Immunologic: Negative.   Neurological: Negative.   Hematological: Negative.   Psychiatric/Behavioral: Negative.   All other systems reviewed and are negative.      Objective:    BP 116/78 mmHg  Pulse 85  Temp(Src) 98.2 F (36.8 C) (Oral)  Wt 258 lb 4 oz (117.141 kg)  SpO2 98%   Physical Exam  Constitutional: She is oriented to person, place, and time. She appears well-developed and well-nourished. No distress.  HENT:  Head: Normocephalic and atraumatic.  Eyes: Conjunctivae are normal.  Neck: Normal range of motion. Neck supple. No thyromegaly present.  Cardiovascular: Normal rate, regular rhythm and normal heart sounds.   Pulmonary/Chest: Effort normal and breath sounds normal.  Abdominal: Soft.  Musculoskeletal: Normal range of motion.  Neurological: She is alert and oriented to person, place, and time. No cranial nerve deficit.  Skin: Skin is warm and dry. She is not diaphoretic.  Psychiatric: She has a normal mood and affect. Her behavior is normal. Judgment and thought content normal.  Nursing note and vitals reviewed.         Assessment & Plan:   Hypertriglyceridemia - Plan: Comprehensive  metabolic panel, Lipid panel  Anxiety and depression  Thyroid disease - Plan: TSH, T4, Free  Encounter for initial prescription of contraceptive pills No Follow-up on file.

## 2015-06-03 NOTE — Assessment & Plan Note (Signed)
Well controlled on current dose of zoloft and psychotherapy.

## 2015-06-04 LAB — T4, FREE: Free T4: 0.67 ng/dL (ref 0.60–1.60)

## 2015-06-28 ENCOUNTER — Ambulatory Visit: Payer: 59 | Admitting: Psychology

## 2015-07-02 ENCOUNTER — Ambulatory Visit (INDEPENDENT_AMBULATORY_CARE_PROVIDER_SITE_OTHER): Payer: 59 | Admitting: Psychology

## 2015-07-02 DIAGNOSIS — F4323 Adjustment disorder with mixed anxiety and depressed mood: Secondary | ICD-10-CM

## 2015-07-30 ENCOUNTER — Ambulatory Visit: Payer: 59 | Admitting: Psychology

## 2015-08-13 ENCOUNTER — Ambulatory Visit (INDEPENDENT_AMBULATORY_CARE_PROVIDER_SITE_OTHER): Payer: 59 | Admitting: Psychology

## 2015-08-13 DIAGNOSIS — F4322 Adjustment disorder with anxiety: Secondary | ICD-10-CM | POA: Diagnosis not present

## 2015-08-13 DIAGNOSIS — Z7189 Other specified counseling: Secondary | ICD-10-CM | POA: Diagnosis not present

## 2015-08-17 ENCOUNTER — Encounter: Payer: Self-pay | Admitting: Family Medicine

## 2015-08-17 ENCOUNTER — Ambulatory Visit (INDEPENDENT_AMBULATORY_CARE_PROVIDER_SITE_OTHER): Payer: 59 | Admitting: Family Medicine

## 2015-08-17 VITALS — BP 124/78 | HR 96 | Temp 97.9°F | Ht 69.5 in | Wt 261.5 lb

## 2015-08-17 DIAGNOSIS — J0100 Acute maxillary sinusitis, unspecified: Secondary | ICD-10-CM

## 2015-08-17 DIAGNOSIS — J019 Acute sinusitis, unspecified: Secondary | ICD-10-CM | POA: Insufficient documentation

## 2015-08-17 MED ORDER — AMOXICILLIN 500 MG PO CAPS: 1000.0000 mg | ORAL_CAPSULE | Freq: Two times a day (BID) | ORAL | Status: DC

## 2015-08-17 NOTE — Progress Notes (Signed)
Pre visit review using our clinic review tool, if applicable. No additional management support is needed unless otherwise documented below in the visit note. 

## 2015-08-17 NOTE — Patient Instructions (Addendum)
Complete course of antibiotics. Nasal saline spray 2 sprays per nostril  2-3 daily. Push fluids. Start Mucinex DM twice daily. Call if not improving as expected.

## 2015-08-17 NOTE — Progress Notes (Signed)
   Subjective:    Patient ID: Vanessa Adams, female    DOB: 09/16/1985, 30 y.o.   MRN: [id number]  Cough This is a new problem. The current episode started in the past 7 days. The problem has been gradually worsening. The problem occurs constantly. The cough is productive of sputum. Associated symptoms include ear congestion, myalgias, nasal congestion, postnasal drip, shortness of breath and wheezing. Associated symptoms comments: Some post tussive emesis, face pain, fatigue, left ear pain. The symptoms are aggravated by lying down (cough keeping up at night). Risk factors: former smoker. Treatments tried:  nasal saline, humidifier, benadryl. The treatment provided mild relief. Her past medical history is significant for environmental allergies. There is no history of asthma, bronchitis or COPD.      Review of Systems  HENT: Positive for postnasal drip.   Respiratory: Positive for cough, shortness of breath and wheezing.   Musculoskeletal: Positive for myalgias.  Allergic/Immunologic: Positive for environmental allergies.       Objective:   Physical Exam  Constitutional: Vital signs are normal. She appears well-developed and well-nourished. She is cooperative.  Non-toxic appearance. She does not appear ill. No distress.  HENT:  Head: Normocephalic.  Right Ear: Hearing, tympanic membrane, external ear and ear canal normal. Tympanic membrane is not erythematous, not retracted and not bulging.  Left Ear: Hearing, tympanic membrane, external ear and ear canal normal. Tympanic membrane is not erythematous, not retracted and not bulging.  Nose: Mucosal edema and rhinorrhea present. Right sinus exhibits maxillary sinus tenderness. Right sinus exhibits no frontal sinus tenderness. Left sinus exhibits maxillary sinus tenderness. Left sinus exhibits no frontal sinus tenderness.  Mouth/Throat: Uvula is midline, oropharynx is clear and moist and mucous membranes are normal.  Eyes: Conjunctivae,  EOM and lids are normal. Pupils are equal, round, and reactive to light. Lids are everted and swept, no foreign bodies found.  Neck: Trachea normal and normal range of motion. Neck supple. Carotid bruit is not present. No thyroid mass and no thyromegaly present.  Cardiovascular: Normal rate, regular rhythm, S1 normal, S2 normal, normal heart sounds, intact distal pulses and normal pulses.  Exam reveals no gallop and no friction rub.   No murmur heard. Pulmonary/Chest: Effort normal and breath sounds normal. No tachypnea. No respiratory distress. She has no decreased breath sounds. She has no wheezes. She has no rhonchi. She has no rales.  Neurological: She is alert.  Skin: Skin is warm, dry and intact. No rash noted.  Psychiatric: Her speech is normal and behavior is normal. Judgment normal. Her mood appears not anxious. Cognition and memory are normal. She does not exhibit a depressed mood.          Assessment & Plan:

## 2015-08-17 NOTE — Assessment & Plan Note (Signed)
Concerning for bacterial infeciton.. Cover with antibiotics.  Nasal saline and mucolytic.

## 2015-09-23 ENCOUNTER — Ambulatory Visit: Payer: 59 | Admitting: Psychology

## 2015-11-09 ENCOUNTER — Ambulatory Visit: Payer: Self-pay | Admitting: Psychology

## 2015-12-08 ENCOUNTER — Ambulatory Visit (INDEPENDENT_AMBULATORY_CARE_PROVIDER_SITE_OTHER): Payer: 59 | Admitting: Psychology

## 2015-12-08 DIAGNOSIS — F4322 Adjustment disorder with anxiety: Secondary | ICD-10-CM

## 2015-12-24 ENCOUNTER — Ambulatory Visit: Payer: 59 | Admitting: Psychology

## 2016-03-13 NOTE — L&D Delivery Note (Signed)
Delivery Note At 7:03 AM a viable female was delivered via Vaginal, Spontaneous (Presentation: occiput anterior  ).  APGAR:7 , 9; weight 9 lbs 1 oz  Placenta status: intact , 3 vessel  Cord:  with the following complications:  Uterine atony resolved with pitocin and 1000 mcg of cytotec per rectum .  Cord pH: NA  Anesthesia:Epidural    Episiotomy: None Lacerations: 2nd degree;Perineal Suture Repair: 3.0 vicryl Est. Blood Loss (mL):  500  Mom to postpartum.  Baby to Couplet care / Skin to Skin. Mom declines circumcision of infant   Keniyah Gelinas J. 02/15/2017, 7:42 AM

## 2016-04-20 ENCOUNTER — Ambulatory Visit (INDEPENDENT_AMBULATORY_CARE_PROVIDER_SITE_OTHER): Payer: Self-pay | Admitting: Family Medicine

## 2016-04-20 ENCOUNTER — Encounter: Payer: Self-pay | Admitting: Family Medicine

## 2016-04-20 VITALS — BP 124/72 | HR 77 | Temp 98.1°F | Wt 272.5 lb

## 2016-04-20 DIAGNOSIS — R2232 Localized swelling, mass and lump, left upper limb: Secondary | ICD-10-CM | POA: Insufficient documentation

## 2016-04-20 NOTE — Assessment & Plan Note (Signed)
New- non fluctuant and small. I and D is not warranted at this time. Advised warm soaks, ok to apply some antibiotic ointment. Call or return to clinic prn if these symptoms worsen or fail to improve as anticipated. The patient indicates understanding of these issues and agrees with the plan.

## 2016-04-20 NOTE — Progress Notes (Signed)
Pre visit review using our clinic review tool, if applicable. No additional management support is needed unless otherwise documented below in the visit note. 

## 2016-04-20 NOTE — Progress Notes (Signed)
Subjective:   Patient ID: Vanessa Adams, female    DOB: 09/19/1985, 31 y.o.   MRN: [id number]  Vanessa Adams is a pleasant 31 y.o. year old female who presents to clinic today with Mass (under L arm)  on 04/20/2016  HPI:  Left axillary mass- felt it a few days ago.  Not getting much bigger, a little less painful. No longer red.  No fevers or chills.  Had recent URI symptoms but those are improving.  Current Outpatient Prescriptions on File Prior to Visit  Medication Sig Dispense Refill  . ferrous fumarate (HEMOCYTE - 106 MG FE) 325 (106 FE) MG TABS tablet Take 1 tablet by mouth.    . levothyroxine (SYNTHROID, LEVOTHROID) 50 MCG tablet TAKE 1 TABLET BY MOUTH EVERY MORNING BEFORE A MEAL 30 tablet 11  . loratadine (CLARITIN) 10 MG tablet Take 10 mg by mouth daily.    . sertraline (ZOLOFT) 100 MG tablet Take 1 tablet (100 mg total) by mouth daily. 30 tablet 11   No current facility-administered medications on file prior to visit.     Allergies  Allergen Reactions  . Sulfa Antibiotics   . Tuberculin Tests     Past Medical History:  Diagnosis Date  . Allergy   . Bell palsy   . Thyroid disease     Past Surgical History:  Procedure Laterality Date  . CHOLECYSTECTOMY  2007    Family History  Problem Relation Age of Onset  . Cancer Mother 50  . Hyperlipidemia Father   . Hypertension Father   . Cancer Maternal Grandmother 7    ovarian cancer    Social History   Social History  . Marital status: Married    Spouse name: N/A  . Number of children: N/A  . Years of education: N/A   Occupational History  . Not on file.   Social History Main Topics  . Smoking status: Former Games developer  . Smokeless tobacco: Never Used     Comment: quit at age 31  . Alcohol use Yes     Comment: rare  . Drug use: No  . Sexual activity: Not on file   Other Topics Concern  . Not on file   Social History Narrative  . No narrative on file   The PMH, PSH, Social History,  Family History, Medications, and allergies have been reviewed in [facility] and have been updated if relevant.   Review of Systems  Constitutional: Negative.   HENT: Negative.   Neurological: Negative.   Psychiatric/Behavioral: Negative.   All other systems reviewed and are negative.      Objective:    BP 124/72   Pulse 77   Temp 98.1 F (36.7 C) (Oral)   Wt 272 lb 8 oz (123.6 kg)   LMP 04/10/2016 (Approximate)   SpO2 97%   BMI 39.66 kg/m    Physical Exam  Constitutional: She is oriented to person, place, and time. She appears well-developed and well-nourished. No distress.  HENT:  Head: Normocephalic.  Eyes: Conjunctivae are normal.  Cardiovascular: Normal rate.   Pulmonary/Chest: Effort normal.  Musculoskeletal: Normal range of motion.  Neurological: She is alert and oriented to person, place, and time. No cranial nerve deficit.  Skin: She is not diaphoretic.     Psychiatric: She has a normal mood and affect. Her behavior is normal. Judgment and thought content normal.  Nursing note and vitals reviewed.         Assessment & Plan:   Axillary  mass, left No Follow-up on file.

## 2016-05-02 ENCOUNTER — Ambulatory Visit: Payer: 59 | Admitting: Psychology

## 2016-05-12 ENCOUNTER — Encounter: Payer: Self-pay | Admitting: Nurse Practitioner

## 2016-05-12 ENCOUNTER — Telehealth: Payer: Self-pay | Admitting: Family Medicine

## 2016-05-12 ENCOUNTER — Ambulatory Visit (INDEPENDENT_AMBULATORY_CARE_PROVIDER_SITE_OTHER): Payer: BLUE CROSS/BLUE SHIELD | Admitting: Nurse Practitioner

## 2016-05-12 VITALS — BP 122/76 | HR 90 | Temp 98.4°F | Ht 69.5 in | Wt 268.0 lb

## 2016-05-12 DIAGNOSIS — R070 Pain in throat: Secondary | ICD-10-CM

## 2016-05-12 DIAGNOSIS — J060 Acute laryngopharyngitis: Secondary | ICD-10-CM | POA: Diagnosis not present

## 2016-05-12 DIAGNOSIS — J0140 Acute pansinusitis, unspecified: Secondary | ICD-10-CM

## 2016-05-12 LAB — POCT RAPID STREP A (OFFICE): Rapid Strep A Screen: NEGATIVE

## 2016-05-12 MED ORDER — FLUTICASONE PROPIONATE 50 MCG/ACT NA SUSP: 2.0000 | Freq: Every day | NASAL | 0 refills | Status: DC

## 2016-05-12 MED ORDER — DM-GUAIFENESIN ER 30-600 MG PO TB12: 1.0000 | ORAL_TABLET | Freq: Two times a day (BID) | ORAL | 0 refills | Status: DC | PRN

## 2016-05-12 MED ORDER — OXYMETAZOLINE HCL 0.05 % NA SOLN: 1.0000 | Freq: Two times a day (BID) | NASAL | 0 refills | Status: DC

## 2016-05-12 MED ORDER — CEFUROXIME AXETIL 250 MG PO TABS: 250.0000 mg | ORAL_TABLET | Freq: Two times a day (BID) | ORAL | 0 refills | Status: DC

## 2016-05-12 NOTE — Patient Instructions (Signed)
URI Instructions: Flonase and Afrin use: apply 1spray of afrin in each nare, wait 5mins, then apply 2sprays of flonase in each nare. Use both nasal spray consecutively x 3days, then flonase only for at least 14days.  Encourage adequate oral hydration.  Use over-the-counter  "cold" medicines  such as "Tylenol cold" , "Advil cold",  "Mucinex" or" Mucinex D"  for cough and congestion.  Avoid decongestants if you have high blood pressure. Use" Delsym" or" Robitussin" cough syrup varietis for cough.  You can use plain "Tylenol" or "Advi"l for fever, chills and achyness.   "Common cold" symptoms are usually triggered by a virus.  The antibiotics are usually not necessary. On average, a" viral cold" illness would take 4-7 days to resolve. Please, make an appointment if you are not better or if you're worse.  

## 2016-05-12 NOTE — Progress Notes (Signed)
Subjective:  Patient ID: Vanessa Adams, female    DOB: [birth year]  Age: 31 y.o. MRN: [id number]  CC: Sore Throat (sore throat,cough,sinus,uvula swollen going on for a week. took ibuprofen, denadyle,,)  Sore Throat   This is a new problem. The current episode started in the past 7 days. There has been no fever. Associated symptoms include congestion, coughing and a hoarse voice. Pertinent negatives include no abdominal pain, diarrhea, drooling, ear discharge, ear pain, headaches, plugged ear sensation, neck pain, stridor or trouble swallowing. She has had no exposure to strep or mono. She has tried NSAIDs for the symptoms. The treatment provided mild relief.   Outpatient Medications Prior to Visit  Medication Sig Dispense Refill  . levothyroxine (SYNTHROID, LEVOTHROID) 50 MCG tablet TAKE 1 TABLET BY MOUTH EVERY MORNING BEFORE A MEAL 30 tablet 11  . sertraline (ZOLOFT) 100 MG tablet Take 1 tablet (100 mg total) by mouth daily. 30 tablet 11  . loratadine (CLARITIN) 10 MG tablet Take 10 mg by mouth daily.    . ferrous fumarate (HEMOCYTE - 106 MG FE) 325 (106 FE) MG TABS tablet Take 1 tablet by mouth.     No facility-administered medications prior to visit.     ROS See HPI  Objective:  BP 122/76   Pulse 90   Temp 98.4 F (36.9 C)   Ht 5' 9.5" (1.765 m)   Wt 268 lb (121.6 kg)   SpO2 98%   BMI 39.01 kg/m   BP Readings from Last 3 Encounters:  05/12/16 122/76  04/20/16 124/72  08/17/15 124/78    Wt Readings from Last 3 Encounters:  05/12/16 268 lb (121.6 kg)  04/20/16 272 lb 8 oz (123.6 kg)  08/17/15 261 lb 8 oz (118.6 kg)    Physical Exam  Constitutional: She is oriented to person, place, and time.  HENT:  Right Ear: Tympanic membrane, external ear and ear canal normal.  Left Ear: Tympanic membrane, external ear and ear canal normal.  Nose: Mucosal edema and rhinorrhea present. Right sinus exhibits no maxillary sinus tenderness and no frontal sinus tenderness. Left  sinus exhibits no maxillary sinus tenderness and no frontal sinus tenderness.  Mouth/Throat: Uvula is midline. No trismus in the jaw. No uvula swelling. Posterior oropharyngeal erythema present. No oropharyngeal exudate.  Eyes: No scleral icterus.  Neck: Normal range of motion. Neck supple.  Cardiovascular: Normal rate and normal heart sounds.   Pulmonary/Chest: Effort normal and breath sounds normal.  Musculoskeletal: She exhibits no edema.  Lymphadenopathy:    She has no cervical adenopathy.  Neurological: She is alert and oriented to person, place, and time.  Skin: Skin is warm and dry.  Vitals reviewed.   Lab Results  Component Value Date   WBC 14.0 (H) 11/29/2009   HGB 8.9 (L) 11/29/2009   HCT 25.5 (L) 11/29/2009   PLT 214 11/29/2009   GLUCOSE 118 (H) 06/03/2015   CHOL 130 06/03/2015   TRIG 230.0 (H) 06/03/2015   HDL 42.00 06/03/2015   LDLDIRECT 70.0 06/03/2015   ALT 17 06/03/2015   AST 15 06/03/2015   NA 139 06/03/2015   K 4.0 06/03/2015   CL 104 06/03/2015   CREATININE 0.57 06/03/2015   BUN 10 06/03/2015   CO2 29 06/03/2015   TSH 5.69 (H) 06/03/2015    No results found.  Assessment & Plan:   Vanessa Adams was Adams today for sore throat.  Diagnoses and all orders for this visit:  Sore throat and laryngitis -  POCT rapid strep A -     fluticasone (FLONASE) 50 MCG/ACT nasal spray; Place 2 sprays into both nostrils daily. -     oxymetazoline (AFRIN NASAL SPRAY) 0.05 % nasal spray; Place 1 spray into both nostrils 2 (two) times daily. Use only for 3days, then stop -     dextromethorphan-guaiFENesin (MUCINEX DM) 30-600 MG 12hr tablet; Take 1 tablet by mouth 2 (two) times daily as needed for cough. -     cefUROXime (CEFTIN) 250 MG tablet; Take 1 tablet (250 mg total) by mouth 2 (two) times daily with a meal.  Acute non-recurrent pansinusitis -     fluticasone (FLONASE) 50 MCG/ACT nasal spray; Place 2 sprays into both nostrils daily. -     oxymetazoline (AFRIN NASAL  SPRAY) 0.05 % nasal spray; Place 1 spray into both nostrils 2 (two) times daily. Use only for 3days, then stop -     dextromethorphan-guaiFENesin (MUCINEX DM) 30-600 MG 12hr tablet; Take 1 tablet by mouth 2 (two) times daily as needed for cough. -     cefUROXime (CEFTIN) 250 MG tablet; Take 1 tablet (250 mg total) by mouth 2 (two) times daily with a meal.   I have discontinued Vanessa Adams's loratadine. I am also having her start on fluticasone, oxymetazoline, dextromethorphan-guaiFENesin, and cefUROXime. Additionally, I am having her maintain her ferrous fumarate, levothyroxine, sertraline, and cetirizine.  Meds ordered this encounter  Medications  . cetirizine (ZYRTEC) 5 MG tablet    Sig: Take 5 mg by mouth daily.  . fluticasone (FLONASE) 50 MCG/ACT nasal spray    Sig: Place 2 sprays into both nostrils daily.    Dispense:  16 g    Refill:  0    Order Specific Question:   Supervising Provider    Answer:   Tresa GarterPLOTNIKOV, ALEKSEI V [[id number]]  . oxymetazoline (AFRIN NASAL SPRAY) 0.05 % nasal spray    Sig: Place 1 spray into both nostrils 2 (two) times daily. Use only for 3days, then stop    Dispense:  30 mL    Refill:  0    Order Specific Question:   Supervising Provider    Answer:   Tresa GarterPLOTNIKOV, ALEKSEI V [[id number]]  . dextromethorphan-guaiFENesin (MUCINEX DM) 30-600 MG 12hr tablet    Sig: Take 1 tablet by mouth 2 (two) times daily as needed for cough.    Dispense:  14 tablet    Refill:  0    Order Specific Question:   Supervising Provider    Answer:   Tresa GarterPLOTNIKOV, ALEKSEI V [[id number]]  . cefUROXime (CEFTIN) 250 MG tablet    Sig: Take 1 tablet (250 mg total) by mouth 2 (two) times daily with a meal.    Dispense:  10 tablet    Refill:  0    Order Specific Question:   Supervising Provider    Answer:   Tresa GarterPLOTNIKOV, ALEKSEI V [[id number]]    Follow-up: Return if symptoms worsen or fail to improve.  Vanessa Pennaharlotte Morene Cecilio, NP

## 2016-05-12 NOTE — Telephone Encounter (Signed)
[facility] Day - Client TELEPHONE ADVICE RECORD [facility] Patient Name: Vanessa RubensLEXANDRA Utecht DOB: [birth year] Initial Comment Caller states uvula is inflammed, sore throat, and some allergies. No other symptoms. Nurse Assessment Nurse: Lane HackerHarley, RN, Elvin SoWindy Date/Time (Eastern Time): 05/12/2016 10:01:07 AM Confirm and document reason for call. If symptomatic, describe symptoms. ---Caller states uvula is inflamed, sore throat, and some seasonal allergies with sinus congestion over the past week. Small ulcers in her mouth from mouth breathing. Ears feel full, no pain. No fever. Does the patient have any new or worsening symptoms? ---Yes Will a triage be completed? ---Yes Related visit to physician within the last 2 weeks? ---No Does the PT have any chronic conditions? (i.e. diabetes, asthma, etc.) ---Yes List chronic conditions. ---seasonal allergies, bell's palsy - affected left side of face Is the patient pregnant or possibly pregnant? (Ask all females between the ages of 8912-55) ---No Is this a behavioral health or substance abuse call? ---No Guidelines Guideline Title Affirmed Question Affirmed Notes Sore Throat Patient sounds very sick or weak to the triager Final Disposition User Go to ED Now (or PCP triage) Lane HackerHarley, RN, Windy Comments Swollen uvula is concerning. [facility]. Pt willing to drive to [place] office for [id number]:[phone] am with Claris Gowerharlotte NP Disagree/Comply: Danella Maiersomply

## 2016-05-12 NOTE — Progress Notes (Signed)
Pre visit review using our clinic review tool, if applicable. No additional management support is needed unless otherwise documented below in the visit note. 

## 2016-05-12 NOTE — Telephone Encounter (Signed)
Pt has appt with Alysia Pennaharlotte Nche NP today at 10:45.

## 2016-06-05 ENCOUNTER — Other Ambulatory Visit: Payer: Self-pay | Admitting: Family Medicine

## 2016-06-05 NOTE — Telephone Encounter (Signed)
Pt last visit 05/12/2016 and one of the medication is not on the list.... Please advice

## 2016-06-12 ENCOUNTER — Ambulatory Visit (INDEPENDENT_AMBULATORY_CARE_PROVIDER_SITE_OTHER): Payer: BLUE CROSS/BLUE SHIELD | Admitting: Psychology

## 2016-06-12 DIAGNOSIS — Z7189 Other specified counseling: Secondary | ICD-10-CM

## 2016-06-12 DIAGNOSIS — F4322 Adjustment disorder with anxiety: Secondary | ICD-10-CM | POA: Diagnosis not present

## 2016-07-03 ENCOUNTER — Ambulatory Visit (INDEPENDENT_AMBULATORY_CARE_PROVIDER_SITE_OTHER): Payer: BLUE CROSS/BLUE SHIELD | Admitting: Family Medicine

## 2016-07-03 ENCOUNTER — Encounter: Payer: Self-pay | Admitting: Family Medicine

## 2016-07-03 VITALS — BP 124/86 | HR 89 | Temp 97.9°F | Wt 269.0 lb

## 2016-07-03 DIAGNOSIS — F329 Major depressive disorder, single episode, unspecified: Secondary | ICD-10-CM | POA: Diagnosis not present

## 2016-07-03 DIAGNOSIS — Z3490 Encounter for supervision of normal pregnancy, unspecified, unspecified trimester: Secondary | ICD-10-CM

## 2016-07-03 DIAGNOSIS — F32A Depression, unspecified: Secondary | ICD-10-CM

## 2016-07-03 DIAGNOSIS — Z3201 Encounter for pregnancy test, result positive: Secondary | ICD-10-CM | POA: Diagnosis not present

## 2016-07-03 DIAGNOSIS — F419 Anxiety disorder, unspecified: Secondary | ICD-10-CM

## 2016-07-03 LAB — POCT URINE PREGNANCY: Preg Test, Ur: POSITIVE — AB

## 2016-07-03 MED ORDER — SERTRALINE HCL 50 MG PO TABS: 50.0000 mg | ORAL_TABLET | Freq: Every day | ORAL | 3 refills | Status: DC

## 2016-07-03 NOTE — Assessment & Plan Note (Signed)
>  25 minutes spent in face to face time with patient, >50% spent in counselling or coordination of care. Discussed tx options including continuing zoloft at current, trying to wean down zoloft to a lower dose or trial of something like Buspar that is a category B. She would like to try lower dose of zoloft and discuss with OB. eRx sent for zoloft 50 mg daily. The patient indicates understanding of these issues and agrees with the plan.

## 2016-07-03 NOTE — Assessment & Plan Note (Signed)
Positive UPT in office- refer to OBGYN.

## 2016-07-03 NOTE — Progress Notes (Signed)
Pre visit review using our clinic review tool, if applicable. No additional management support is needed unless otherwise documented below in the visit note. 

## 2016-07-03 NOTE — Patient Instructions (Signed)
Great to see you.  We are decreasing your zoloft to 50 mg daily - ok to cut tab in half and take 25 mg daily after a few weeks. Please keep me updated.  Please stop by to see Shirlee Limerick on your way out.

## 2016-07-03 NOTE — Progress Notes (Signed)
Subjective:   Patient ID: Vanessa Adams, female    DOB: 10-03-1985, 31 y.o.   MRN: [id number]  Vanessa Adams is a pleasant 31 y.o. year old female who presents to clinic today with Possible Pregnancy (Has done 3 home test. 2 negatives and 1 positive. But, the negative changed to positive after time frame.) and Depression (If pregnant, needs to discuss treatment options.)  on 07/03/2016  HPI:  ? pregnancy . Positive HPT.  Not sure when her last menstrual period was.  Was on OCPs until 03/2016 and stopped because she did want to become pregnant but has not had a period "in a while."  No spotting.  No nausea.  Depression/anxiety/OCD- has been well controlled on zoloft 100 mg daily. She is not sure what to do now that she may be pregnant. Current Outpatient Prescriptions on File Prior to Visit  Medication Sig Dispense Refill  . cetirizine (ZYRTEC) 5 MG tablet Take 5 mg by mouth daily.    . ferrous fumarate (HEMOCYTE - 106 MG FE) 325 (106 FE) MG TABS tablet Take 1 tablet by mouth.    . levothyroxine (SYNTHROID, LEVOTHROID) 50 MCG tablet TAKE 1 TABLET BY MOUTH EVERY MORNING BEFORE A MEAL 30 tablet 0   No current facility-administered medications on file prior to visit.     Allergies  Allergen Reactions  . Sulfa Antibiotics   . Tuberculin Tests     Past Medical History:  Diagnosis Date  . Allergy   . Bell palsy   . Thyroid disease     Past Surgical History:  Procedure Laterality Date  . CHOLECYSTECTOMY  2007    Family History  Problem Relation Age of Onset  . Cancer Mother 33  . Hyperlipidemia Father   . Hypertension Father   . Cancer Maternal Grandmother 68    ovarian cancer    Social History   Social History  . Marital status: Married    Spouse name: N/A  . Number of children: N/A  . Years of education: N/A   Occupational History  . Not on file.   Social History Main Topics  . Smoking status: Former Games developer  . Smokeless tobacco: Never Used   Comment: quit at age 72  . Alcohol use Yes     Comment: rare  . Drug use: No  . Sexual activity: Not on file   Other Topics Concern  . Not on file   Social History Narrative  . No narrative on file   The PMH, PSH, Social History, Family History, Medications, and allergies have been reviewed in [facility], and have been updated if relevant.   Review of Systems  Cardiovascular: Negative.   Genitourinary: Negative for decreased urine volume and vaginal bleeding.  Psychiatric/Behavioral: Negative for agitation, behavioral problems, confusion, decreased concentration, dysphoric mood, hallucinations, self-injury, sleep disturbance and suicidal ideas. The patient is not nervous/anxious and is not hyperactive.   All other systems reviewed and are negative.      Objective:    BP 124/86 (BP Location: Right Arm, Patient Position: Sitting, Cuff Size: Large)   Pulse 89   Temp 97.9 F (36.6 C) (Oral)   Wt 269 lb (122 kg)   LMP 05/12/2016 (Approximate)   SpO2 98%   BMI 39.15 kg/m    Physical Exam  Constitutional: She is oriented to person, place, and time. She appears well-developed and well-nourished. No distress.  HENT:  Head: Normocephalic and atraumatic.  Eyes: Conjunctivae are normal.  Cardiovascular: Normal rate.  Pulmonary/Chest: Effort normal.  Musculoskeletal: Normal range of motion.  Neurological: She is alert and oriented to person, place, and time. No cranial nerve deficit.  Skin: Skin is warm and dry. She is not diaphoretic.  Psychiatric: She has a normal mood and affect. Her behavior is normal. Judgment and thought content normal.  Nursing note and vitals reviewed.         Assessment & Plan:   Pregnancy, unspecified gestational age - Plan: Ambulatory referral to Obstetrics / Gynecology  Anxiety and depression  Positive pregnancy test - Plan: POCT urine pregnancy, Ambulatory referral to Obstetrics / Gynecology No Follow-up on file.

## 2016-07-10 ENCOUNTER — Ambulatory Visit: Payer: Self-pay | Admitting: Psychology

## 2016-07-17 LAB — OB RESULTS CONSOLE HEPATITIS B SURFACE ANTIGEN: Hepatitis B Surface Ag: NEGATIVE

## 2016-07-17 LAB — OB RESULTS CONSOLE HIV ANTIBODY (ROUTINE TESTING): HIV: NONREACTIVE

## 2016-07-17 LAB — OB RESULTS CONSOLE RUBELLA ANTIBODY, IGM: Rubella: IMMUNE

## 2016-07-17 LAB — OB RESULTS CONSOLE RPR: RPR: NONREACTIVE

## 2016-08-04 ENCOUNTER — Other Ambulatory Visit: Payer: Self-pay | Admitting: Obstetrics and Gynecology

## 2016-08-04 ENCOUNTER — Other Ambulatory Visit (HOSPITAL_COMMUNITY)
Admission: RE | Admit: 2016-08-04 | Discharge: 2016-08-04 | Disposition: A | Discharge status: Home / Self Care | Payer: BLUE CROSS/BLUE SHIELD | Source: Ambulatory Visit | Attending: Obstetrics and Gynecology | Admitting: Obstetrics and Gynecology

## 2016-08-04 DIAGNOSIS — Z01419 Encounter for gynecological examination (general) (routine) without abnormal findings: Secondary | ICD-10-CM | POA: Diagnosis present

## 2016-08-04 DIAGNOSIS — Z1151 Encounter for screening for human papillomavirus (HPV): Secondary | ICD-10-CM | POA: Diagnosis not present

## 2016-08-10 LAB — CYTOLOGY - PAP
Chlamydia: NEGATIVE
Diagnosis: NEGATIVE
HPV: NOT DETECTED
Neisseria Gonorrhea: NEGATIVE

## 2016-09-15 ENCOUNTER — Ambulatory Visit (INDEPENDENT_AMBULATORY_CARE_PROVIDER_SITE_OTHER): Payer: BLUE CROSS/BLUE SHIELD | Admitting: Family Medicine

## 2016-09-15 ENCOUNTER — Encounter: Payer: Self-pay | Admitting: Family Medicine

## 2016-09-15 VITALS — BP 132/82 | HR 85 | Temp 97.9°F | Wt 271.6 lb

## 2016-09-15 DIAGNOSIS — J0101 Acute recurrent maxillary sinusitis: Secondary | ICD-10-CM

## 2016-09-15 MED ORDER — AMOXICILLIN 875 MG PO TABS: 875.0000 mg | ORAL_TABLET | Freq: Two times a day (BID) | ORAL | 0 refills | Status: DC

## 2016-09-15 MED ORDER — MOMETASONE FUROATE 50 MCG/ACT NA SUSP: 2.0000 | Freq: Every day | NASAL | 12 refills | Status: DC

## 2016-09-15 NOTE — Patient Instructions (Addendum)
Safe during pregnancy- Afrin nasal spray twice a day for no more than 4 days Plain mucinex- this will help thin your secretions for better drainage I have sent in a different nasal spray, please use daily for at least 2 weeks Use Neti- pot several times a day If not better in 7-10 days, can start antibiotic   Sinusitis, Adult Sinusitis is soreness and inflammation of your sinuses. Sinuses are hollow spaces in the bones around your face. Your sinuses are located:  Around your eyes.  In the middle of your forehead.  Behind your nose.  In your cheekbones.  Your sinuses and nasal passages are lined with a stringy fluid (mucus). Mucus normally drains out of your sinuses. When your nasal tissues become inflamed or swollen, the mucus can become trapped or blocked so air cannot flow through your sinuses. This allows bacteria, viruses, and funguses to grow, which leads to infection. Sinusitis can develop quickly and last for 7?10 days (acute) or for more than 12 weeks (chronic). Sinusitis often develops after a cold. What are the causes? This condition is caused by anything that creates swelling in the sinuses or stops mucus from draining, including:  Allergies.  Asthma.  Bacterial or viral infection.  Abnormally shaped bones between the nasal passages.  Nasal growths that contain mucus (nasal polyps).  Narrow sinus openings.  Pollutants, such as chemicals or irritants in the air.  A foreign object stuck in the nose.  A fungal infection. This is rare.  What increases the risk? The following factors may make you more likely to develop this condition:  Having allergies or asthma.  Having had a recent cold or respiratory tract infection.  Having structural deformities or blockages in your nose or sinuses.  Having a weak immune system.  Doing a lot of swimming or diving.  Overusing nasal sprays.  Smoking.  What are the signs or symptoms? The main symptoms of this  condition are pain and a feeling of pressure around the affected sinuses. Other symptoms include:  Upper toothache.  Earache.  Headache.  Bad breath.  Decreased sense of smell and taste.  A cough that may get worse at night.  Fatigue.  Fever.  Thick drainage from your nose. The drainage is often green and it may contain pus (purulent).  Stuffy nose or congestion.  Postnasal drip. This is when extra mucus collects in the throat or back of the nose.  Swelling and warmth over the affected sinuses.  Sore throat.  Sensitivity to light.  How is this diagnosed? This condition is diagnosed based on symptoms, a medical history, and a physical exam. To find out if your condition is acute or chronic, your health care provider may:  Look in your nose for signs of nasal polyps.  Tap over the affected sinus to check for signs of infection.  View the inside of your sinuses using an imaging device that has a light attached (endoscope).  If your health care provider suspects that you have chronic sinusitis, you may also:  Be tested for allergies.  Have a sample of mucus taken from your nose (nasal culture) and checked for bacteria.  Have a mucus sample examined to see if your sinusitis is related to an allergy.  If your sinusitis does not respond to treatment and it lasts longer than 8 weeks, you may have an MRI or CT scan to check your sinuses. These scans also help to determine how severe your infection is. In rare cases, a bone  biopsy may be done to rule out more serious types of fungal sinus disease. How is this treated? Treatment for sinusitis depends on the cause and whether your condition is chronic or acute. If a virus is causing your sinusitis, your symptoms will go away on their own within 10 days. You may be given medicines to relieve your symptoms, including:  Topical nasal decongestants. They shrink swollen nasal passages and let mucus drain from your  sinuses.  Antihistamines. These drugs block inflammation that is triggered by allergies. This can help to ease swelling in your nose and sinuses.  Topical nasal corticosteroids. These are nasal sprays that ease inflammation and swelling in your nose and sinuses.  Nasal saline washes. These rinses can help to get rid of thick mucus in your nose.  If your condition is caused by bacteria, you will be given an antibiotic medicine. If your condition is caused by a fungus, you will be given an antifungal medicine. Surgery may be needed to correct underlying conditions, such as narrow nasal passages. Surgery may also be needed to remove polyps. Follow these instructions at home: Medicines  Take, use, or apply over-the-counter and prescription medicines only as told by your health care provider. These may include nasal sprays.  If you were prescribed an antibiotic medicine, take it as told by your health care provider. Do not stop taking the antibiotic even if you start to feel better. Hydrate and Humidify  Drink enough water to keep your urine clear or pale yellow. Staying hydrated will help to thin your mucus.  Use a cool mist humidifier to keep the humidity level in your home above 50%.  Inhale steam for 10-15 minutes, 3-4 times a day or as told by your health care provider. You can do this in the bathroom while a hot shower is running.  Limit your exposure to cool or dry air. Rest  Rest as much as possible.  Sleep with your head raised (elevated).  Make sure to get enough sleep each night. General instructions  Apply a warm, moist washcloth to your face 3-4 times a day or as told by your health care provider. This will help with discomfort.  Wash your hands often with soap and water to reduce your exposure to viruses and other germs. If soap and water are not available, use hand sanitizer.  Do not smoke. Avoid being around people who are smoking (secondhand smoke).  Keep all  follow-up visits as told by your health care provider. This is important. Contact a health care provider if:  You have a fever.  Your symptoms get worse.  Your symptoms do not improve within 10 days. Get help right away if:  You have a severe headache.  You have persistent vomiting.  You have pain or swelling around your face or eyes.  You have vision problems.  You develop confusion.  Your neck is stiff.  You have trouble breathing. This information is not intended to replace advice given to you by your health care provider. Make sure you discuss any questions you have with your health care provider. Document Released: 02/27/2005 Document Revised: 10/24/2015 Document Reviewed: 12/23/2014 Elsevier Interactive Patient Education  2017 [employer].

## 2016-09-15 NOTE — Progress Notes (Signed)
Subjective:    Patient ID: Vanessa Adams SeenAlexandra A Pomerleau, female    DOB: 12-03-1985, 31 y.o.   MRN: [id number]  HPI This is a 31 yo female, accompanied by her daughter, who presents today with sinus pressure on left side of face since yesterday. Takes zyrtec daily, has flonase but doesn't use often because it causes dryness. Has history of Bell's palsy on left side and is prone to left sided sinusitis. Left sided ear pressure. Tylenol without much relief. Not sure if she is running fever. A lot of post nasal drip. Severe cough with this. Nasal drainage is clear after neti pot, gets more yellow through out the day. Good water intake.  Is 4.5 months pregnant.   Past Medical History:  Diagnosis Date  . Allergy   . Bell palsy   . Thyroid disease    Past Surgical History:  Procedure Laterality Date  . CHOLECYSTECTOMY  2007   Family History  Problem Relation Age of Onset  . Cancer Mother [id number]  . Hyperlipidemia Father   . Hypertension Father   . Cancer Maternal Grandmother 45       ovarian cancer   Social History  Substance Use Topics  . Smoking status: Former Games developermoker  . Smokeless tobacco: Never Used     Comment: quit at age 31  . Alcohol use Yes     Comment: rare      Review of Systems Per HPI    Objective:   Physical Exam  Constitutional: She is oriented to person, place, and time. She appears well-developed and well-nourished. No distress.  Obese.   HENT:  Head: Normocephalic and atraumatic.  Right Ear: Tympanic membrane, external ear and ear canal normal.  Left Ear: Tympanic membrane, external ear and ear canal normal.  Nose: Mucosal edema and rhinorrhea present. Right sinus exhibits no maxillary sinus tenderness and no frontal sinus tenderness. Left sinus exhibits maxillary sinus tenderness. Left sinus exhibits no frontal sinus tenderness.  Mouth/Throat: Uvula is midline, oropharynx is clear and moist and mucous membranes are normal.  Eyes: Conjunctivae are normal.  Neck: Normal  range of motion. Neck supple.  Cardiovascular: Normal rate, regular rhythm and normal heart sounds.   Pulmonary/Chest: Effort normal and breath sounds normal.  Neurological: She is alert and oriented to person, place, and time.  Skin: Skin is warm and dry. She is not diaphoretic.  Psychiatric: She has a normal mood and affect. Her behavior is normal. Judgment and thought content normal.  Vitals reviewed.     BP 132/82 (BP Location: Right Arm, Patient Position: Sitting, Cuff Size: Normal)   Pulse 85   Temp 97.9 F (36.6 C) (Oral)   Wt 271 lb 9.6 oz (123.2 kg)   LMP 05/12/2016 (Approximate)   SpO2 98%   BMI 39.53 kg/m  BP Readings from Last 3 Encounters:  09/15/16 132/82  07/03/16 124/86  05/12/16 122/76   Wt Readings from Last 3 Encounters:  09/15/16 271 lb 9.6 oz (123.2 kg)  07/03/16 269 lb (122 kg)  05/12/16 268 lb (121.6 kg)       Assessment & Plan:  1. Acute recurrent maxillary sinusitis - Provided written and verbal information regarding diagnosis and treatment. - likely viral/allergic, provided wait and see antibiotic if not better in 7-10 days - Safe during pregnancy- Afrin nasal spray twice a day for no more than 4 days Plain mucinex- this will help thin your secretions for better drainage I have sent in a different nasal spray, please use daily  for at least 2 weeks Use Neti- pot several times a day If not better in 7-10 days, can start antibiotic - mometasone (NASONEX) 50 MCG/ACT nasal spray; Place 2 sprays into the nose daily.  Dispense: 17 g; Refill: 12 - amoxicillin (AMOXIL) 875 MG tablet; Take 1 tablet (875 mg total) by mouth 2 (two) times daily.  Dispense: 14 tablet; Refill: 0   Olean Ree, FNP-BC  [facility], [facility]  09/15/2016 11:34 AM

## 2016-10-12 ENCOUNTER — Ambulatory Visit (INDEPENDENT_AMBULATORY_CARE_PROVIDER_SITE_OTHER): Payer: BLUE CROSS/BLUE SHIELD | Admitting: Psychology

## 2016-10-12 DIAGNOSIS — F4323 Adjustment disorder with mixed anxiety and depressed mood: Secondary | ICD-10-CM | POA: Diagnosis not present

## 2016-11-20 ENCOUNTER — Other Ambulatory Visit: Payer: Self-pay | Admitting: Family Medicine

## 2016-11-30 ENCOUNTER — Ambulatory Visit: Payer: BLUE CROSS/BLUE SHIELD | Admitting: Psychology

## 2017-01-01 ENCOUNTER — Ambulatory Visit: Payer: Self-pay

## 2017-01-11 DIAGNOSIS — Z3483 Encounter for supervision of other normal pregnancy, third trimester: Secondary | ICD-10-CM | POA: Diagnosis not present

## 2017-01-12 ENCOUNTER — Encounter (HOSPITAL_COMMUNITY): Payer: Self-pay | Admitting: *Deleted

## 2017-01-12 ENCOUNTER — Inpatient Hospital Stay (HOSPITAL_COMMUNITY)
Admission: AD | Admit: 2017-01-12 | Discharge: 2017-01-12 | Disposition: A | Discharge status: Home / Self Care | Payer: 59 | Source: Ambulatory Visit | Attending: Obstetrics and Gynecology | Admitting: Obstetrics and Gynecology

## 2017-01-12 DIAGNOSIS — Z87891 Personal history of nicotine dependence: Secondary | ICD-10-CM | POA: Diagnosis not present

## 2017-01-12 DIAGNOSIS — O99283 Endocrine, nutritional and metabolic diseases complicating pregnancy, third trimester: Secondary | ICD-10-CM | POA: Diagnosis not present

## 2017-01-12 DIAGNOSIS — R102 Pelvic and perineal pain: Secondary | ICD-10-CM | POA: Diagnosis present

## 2017-01-12 DIAGNOSIS — O479 False labor, unspecified: Secondary | ICD-10-CM | POA: Diagnosis not present

## 2017-01-12 DIAGNOSIS — Z3A33 33 weeks gestation of pregnancy: Secondary | ICD-10-CM | POA: Insufficient documentation

## 2017-01-12 DIAGNOSIS — Z79899 Other long term (current) drug therapy: Secondary | ICD-10-CM | POA: Diagnosis not present

## 2017-01-12 DIAGNOSIS — O26893 Other specified pregnancy related conditions, third trimester: Secondary | ICD-10-CM | POA: Diagnosis present

## 2017-01-12 DIAGNOSIS — O133 Gestational [pregnancy-induced] hypertension without significant proteinuria, third trimester: Secondary | ICD-10-CM

## 2017-01-12 DIAGNOSIS — O163 Unspecified maternal hypertension, third trimester: Secondary | ICD-10-CM | POA: Diagnosis not present

## 2017-01-12 DIAGNOSIS — E079 Disorder of thyroid, unspecified: Secondary | ICD-10-CM | POA: Diagnosis not present

## 2017-01-12 DIAGNOSIS — N898 Other specified noninflammatory disorders of vagina: Secondary | ICD-10-CM | POA: Diagnosis not present

## 2017-01-12 DIAGNOSIS — O4703 False labor before 37 completed weeks of gestation, third trimester: Secondary | ICD-10-CM | POA: Diagnosis not present

## 2017-01-12 LAB — URINALYSIS, ROUTINE W REFLEX MICROSCOPIC
Bilirubin Urine: NEGATIVE
Glucose, UA: NEGATIVE mg/dL
Hgb urine dipstick: NEGATIVE
Ketones, ur: NEGATIVE mg/dL
Leukocytes, UA: NEGATIVE
Nitrite: NEGATIVE
Protein, ur: NEGATIVE mg/dL
Specific Gravity, Urine: 1.025 (ref 1.005–1.030)
pH: 6.5 (ref 5.0–8.0)

## 2017-01-12 LAB — COMPREHENSIVE METABOLIC PANEL
ALT: 11 U/L — ABNORMAL LOW (ref 14–54)
AST: 14 U/L — ABNORMAL LOW (ref 15–41)
Albumin: 2.8 g/dL — ABNORMAL LOW (ref 3.5–5.0)
Alkaline Phosphatase: 131 U/L — ABNORMAL HIGH (ref 38–126)
Anion gap: 7 (ref 5–15)
BUN: 8 mg/dL (ref 6–20)
CO2: 23 mmol/L (ref 22–32)
Calcium: 9.4 mg/dL (ref 8.9–10.3)
Chloride: 108 mmol/L (ref 101–111)
Creatinine, Ser: 0.3 mg/dL — ABNORMAL LOW (ref 0.44–1.00)
GFR calc Af Amer: >60 mL/min (ref >60)
GFR calc non Af Amer: >60 mL/min (ref >60)
Glucose, Bld: 83 mg/dL (ref 65–99)
Potassium: 3.5 mmol/L (ref 3.5–5.1)
Sodium: 138 mmol/L (ref 135–145)
Total Bilirubin: 0.4 mg/dL (ref 0.3–1.2)
Total Protein: 6.2 g/dL — ABNORMAL LOW (ref 6.5–8.1)

## 2017-01-12 LAB — WET PREP, GENITAL
Clue Cells Wet Prep HPF POC: NONE SEEN
Sperm: NONE SEEN
Trich, Wet Prep: NONE SEEN
Yeast Wet Prep HPF POC: NONE SEEN

## 2017-01-12 LAB — PROTEIN / CREATININE RATIO, URINE
Creatinine, Urine: 99 mg/dL
Protein Creatinine Ratio: 0.09 mg/mg{Cre} (ref 0.00–0.15)
Total Protein, Urine: 9 mg/dL

## 2017-01-12 LAB — POCT FERN TEST: POCT Fern Test: NEGATIVE

## 2017-01-12 LAB — CBC
HCT: 36 % (ref 36.0–46.0)
Hemoglobin: 12.2 g/dL (ref 12.0–15.0)
MCH: 32.4 pg (ref 26.0–34.0)
MCHC: 33.9 g/dL (ref 30.0–36.0)
MCV: 95.7 fL (ref 78.0–100.0)
Platelets: 252 10*3/uL (ref 150–400)
RBC: 3.76 MIL/uL — ABNORMAL LOW (ref 3.87–5.11)
RDW: 13.4 % (ref 11.5–15.5)
WBC: 10 10*3/uL (ref 4.0–10.5)

## 2017-01-12 NOTE — Progress Notes (Signed)
Pt requesting manual blood pressure.  States "automatic blood pressure cuffs don't work on me, it's saying my blood pressure is high because it hurts my arm".  Blood pressure manually was 144/92.

## 2017-01-12 NOTE — MAU Provider Note (Signed)
History     CSN: [id number]  Arrival date and time: 01/12/17 2011   First Provider Initiated Contact with Patient 01/12/17 2106      Chief Complaint  Patient presents with  . Pelvic Pain   Vanessa Adams is a 31 y.o. G3P1011 at [redacted]w[redacted]d who presents today with contractions x 4 days, and increased water vaginal discharge x 4 days as well. She denies any VB. She reports normal fetal movement.    Pelvic Pain  The patient's primary symptoms include pelvic pain and vaginal discharge. This is a new problem. Episode onset: about 4 days ago. The problem occurs intermittently. The problem has been unchanged. Pain severity now: 6/10  The problem affects both sides. She is pregnant. Associated symptoms include frequency. Pertinent negatives include no chills, diarrhea, dysuria, fever, hematuria, nausea, urgency or vomiting. Nothing aggravates the symptoms. She has tried nothing for the symptoms. Sexual activity: no intercourse in the last 24 hours.     Past Medical History:  Diagnosis Date  . Allergy   . Bell palsy   . Thyroid disease     Past Surgical History:  Procedure Laterality Date  . CHOLECYSTECTOMY  2007    Family History  Problem Relation Age of Onset  . Cancer Mother 59  . Hyperlipidemia Father   . Hypertension Father   . Cancer Maternal Grandmother 45       ovarian cancer    Social History  Substance Use Topics  . Smoking status: Former Games developer  . Smokeless tobacco: Never Used     Comment: quit at age 30  . Alcohol use Yes     Comment: rare    Allergies:  Allergies  Allergen Reactions  . Sulfa Antibiotics   . Tuberculin Tests     Prescriptions Prior to Admission  Medication Sig Dispense Refill Last Dose  . amoxicillin (AMOXIL) 875 MG tablet Take 1 tablet (875 mg total) by mouth 2 (two) times daily. 14 tablet 0   . cetirizine (ZYRTEC) 5 MG tablet Take 5 mg by mouth daily.   Taking  . ferrous fumarate (HEMOCYTE - 106 MG FE) 325 (106 FE) MG TABS tablet Take 1  tablet by mouth.   Not Taking  . levothyroxine (SYNTHROID, LEVOTHROID) 50 MCG tablet TAKE 1 TABLET BY MOUTH EVERY MORNING BEFORE A MEAL 30 tablet 0   . mometasone (NASONEX) 50 MCG/ACT nasal spray Place 2 sprays into the nose daily. 17 g 12   . sertraline (ZOLOFT) 50 MG tablet Take 1 tablet (50 mg total) by mouth daily. 30 tablet 3 Taking    Review of Systems  Constitutional: Negative for chills and fever.  Gastrointestinal: Negative for diarrhea, nausea and vomiting.  Genitourinary: Positive for frequency, pelvic pain and vaginal discharge. Negative for dysuria, hematuria, urgency and vaginal bleeding.   Physical Exam   Blood pressure (!) 157/84, pulse 98, temperature 98.5 F (36.9 C), resp. rate 18, height 5\' 8"  (1.727 m), weight 290 lb (131.5 kg), last menstrual period 05/12/2016.  Physical Exam  Nursing note and vitals reviewed. Constitutional: She is oriented to person, place, and time. She appears well-developed and well-nourished. No distress.  HENT:  Head: Normocephalic.  Cardiovascular: Normal rate.   Respiratory: Effort normal.  GI: Soft. There is no tenderness. There is no rebound.  Genitourinary:  Genitourinary Comments:  External: no lesion Vagina: small amount of white discharge. No pooling  Cervix: pink, smooth, no fluid seen with valsalva. FT/thick/ballotable  Uterus: AGA   Neurological: She is  alert and oriented to person, place, and time.  Skin: Skin is warm and dry.  Psychiatric: She has a normal mood and affect.   FHT: 145, moderate with 15x15 accels, no decels Toco: irregular contractions.   Results for orders placed or performed during the hospital encounter of 01/12/17 (from the past 24 hour(s))  Urinalysis, Routine w reflex microscopic     Status: None   Collection Time: 01/12/17  8:45 PM  Result Value Ref Range   Color, Urine YELLOW YELLOW   APPearance CLEAR CLEAR   Specific Gravity, Urine 1.025 1.005 - 1.030   pH 6.5 5.0 - 8.0   Glucose, UA  NEGATIVE NEGATIVE mg/dL   Hgb urine dipstick NEGATIVE NEGATIVE   Bilirubin Urine NEGATIVE NEGATIVE   Ketones, ur NEGATIVE NEGATIVE mg/dL   Protein, ur NEGATIVE NEGATIVE mg/dL   Nitrite NEGATIVE NEGATIVE   Leukocytes, UA NEGATIVE NEGATIVE  Protein / creatinine ratio, urine     Status: None   Collection Time: 01/12/17  8:45 PM  Result Value Ref Range   Creatinine, Urine 99.00 mg/dL   Total Protein, Urine 9 mg/dL   Protein Creatinine Ratio 0.09 0.00 - 0.15 mg/mg[Cre]  Wet prep, genital     Status: Abnormal   Collection Time: 01/12/17  9:20 PM  Result Value Ref Range   Yeast Wet Prep HPF POC NONE SEEN NONE SEEN   Trich, Wet Prep NONE SEEN NONE SEEN   Clue Cells Wet Prep HPF POC NONE SEEN NONE SEEN   WBC, Wet Prep HPF POC MANY (A) NONE SEEN   Sperm NONE SEEN   Fern Test     Status: None   Collection Time: 01/12/17  9:28 PM  Result Value Ref Range   POCT Fern Test Negative = intact amniotic membranes   CBC     Status: Abnormal   Collection Time: 01/12/17 10:12 PM  Result Value Ref Range   WBC 10.0 4.0 - 10.5 K/uL   RBC 3.76 (L) 3.87 - 5.11 MIL/uL   Hemoglobin 12.2 12.0 - 15.0 g/dL   HCT 81.1 91.4 - 78.2 %   MCV 95.7 78.0 - 100.0 fL   MCH 32.4 26.0 - 34.0 pg   MCHC 33.9 30.0 - 36.0 g/dL   RDW 95.6 21.3 - 08.6 %   Platelets 252 150 - 400 K/uL  Comprehensive metabolic panel     Status: Abnormal   Collection Time: 01/12/17 10:12 PM  Result Value Ref Range   Sodium 138 135 - 145 mmol/L   Potassium 3.5 3.5 - 5.1 mmol/L   Chloride 108 101 - 111 mmol/L   CO2 23 22 - 32 mmol/L   Glucose, Bld 83 65 - 99 mg/dL   BUN 8 6 - 20 mg/dL   Creatinine, Ser 5.78 (L) 0.44 - 1.00 mg/dL   Calcium 9.4 8.9 - 46.9 mg/dL   Total Protein 6.2 (L) 6.5 - 8.1 g/dL   Albumin 2.8 (L) 3.5 - 5.0 g/dL   AST 14 (L) 15 - 41 U/L   ALT 11 (L) 14 - 54 U/L   Alkaline Phosphatase 131 (H) 38 - 126 U/L   Total Bilirubin 0.4 0.3 - 1.2 mg/dL   GFR calc non Af Amer >60 >60 mL/min   GFR calc Af Amer >60 >60 mL/min    Anion gap 7 5 - 15    MAU Course  Procedures  MDM On exam patient noted to have high blood pressure (157.84. 155/85). She denies any hx of hypertension, HA,  visual disturbances or RUQ pain. She states that her blood pressure is always high with the electric cuff. Will check with manual cuff to see if there is a variance.   2214 Patient blood pressure checked with manual cuff and still >140/90. Patient now refusing to have blood pressure checked again.   2300 paged Dr. Dion BodyVarnado.   2306: DW Dr. Dion BodyVarnado, ok for DC home. FU with the office Monday for blood pressure check.   2342: She states that her blood pressure is typically low, and that the blood pressure cuff is causing stress and pain. That in turn in causing her blood pressure to be elevated. Patient states that she has a manual blood pressure cuff at home that she can use, and already has an appointment for Monday. Advised patient that changes in blood pressure at the end of pregnancy could signal pre-eclampsia and should be monitored.   Assessment and Plan   1. Braxton Hick's contraction   2. [redacted] weeks gestation of pregnancy   3. Gestational hypertension, third trimester    DC home 3rd Trimester precautions  Pre-eclampsia warning signs  PTL precautions  Fetal kick counts RX: none, patient advised to purchase a blood pressure cuff that she feels she can trust and check blood pressure once per day.  Return to MAU as needed FU with OB Monday for blood pressure check   Follow-up Information    Gerald Leitzole, Tara, MD Follow up.   Specialty:  Obstetrics and Gynecology Why:  Call the office at 8:00 am to be seen for a blood pressure check.  Purcharse a blood pressure cuff to check blood pressure at home once per day. Contact information: [address] [place] [address] [phone]            Thressa ShellerHeather Veronika Heard 01/12/2017, 9:09 PM

## 2017-01-12 NOTE — Discharge Instructions (Signed)
Preeclampsia and Eclampsia °Preeclampsia is a serious condition that develops only during pregnancy. It is also called toxemia of pregnancy. This condition causes high blood pressure along with other symptoms, such as swelling and headaches. These symptoms may develop as the condition gets worse. Preeclampsia may occur at 20 weeks of pregnancy or later. °Diagnosing and treating preeclampsia early is very important. If not treated early, it can cause serious problems for you and your baby. One problem it can lead to is eclampsia, which is a condition that causes muscle jerking or shaking (convulsions or seizures) in the mother. Delivering your baby is the best treatment for preeclampsia or eclampsia. Preeclampsia and eclampsia symptoms usually go away after your baby is born. °What are the causes? °The cause of preeclampsia is not known. °What increases the risk? °The following risk factors make you more likely to develop preeclampsia: °· Being pregnant for the first time. °· Having had preeclampsia during a past pregnancy. °· Having a family history of preeclampsia. °· Having high blood pressure. °· Being pregnant with twins or triplets. °· Being 35 or older. °· Being African-American. °· Having kidney disease or diabetes. °· Having medical conditions such as lupus or blood diseases. °· Being very overweight (obese). ° °What are the signs or symptoms? °The earliest signs of preeclampsia are: °· High blood pressure. °· Increased protein in your urine. Your health care provider will check for this at every visit before you give birth (prenatal visit). ° °Other symptoms that may develop as the condition gets worse include: °· Severe headaches. °· Sudden weight gain. °· Swelling of the hands, face, legs, and feet. °· Nausea and vomiting. °· Vision problems, such as blurred or double vision. °· Numbness in the face, arms, legs, and feet. °· Urinating less than usual. °· Dizziness. °· Slurred speech. °· Abdominal pain,  especially upper abdominal pain. °· Convulsions or seizures. ° °Symptoms generally go away after giving birth. °How is this diagnosed? °There are no screening tests for preeclampsia. Your health care provider will ask you about symptoms and check for signs of preeclampsia during your prenatal visits. You may also have tests that include: °· Urine tests. °· Blood tests. °· Checking your blood pressure. °· Monitoring your baby’s heart rate. °· Ultrasound. ° °How is this treated? °You and your health care provider will determine the treatment approach that is best for you. Treatment may include: °· Having more frequent prenatal exams to check for signs of preeclampsia, if you have an increased risk for preeclampsia. °· Bed rest. °· Reducing how much salt (sodium) you eat. °· Medicine to lower your blood pressure. °· Staying in the hospital, if your condition is severe. There, treatment will focus on controlling your blood pressure and the amount of fluids in your body (fluid retention). °· You may need to take medicine (magnesium sulfate) to prevent seizures. This medicine may be given as an injection or through an IV tube. °· Delivering your baby early, if your condition gets worse. You may have your labor started with medicine (induced), or you may have a cesarean delivery. ° °Follow these instructions at home: °Eating and drinking ° °· Drink enough fluid to keep your urine clear or pale yellow. °· Eat a healthy diet that is low in sodium. Do not add salt to your food. Check nutrition labels to see how much sodium a food or beverage contains. °· Avoid caffeine. °Lifestyle °· Do not use any products that contain nicotine or tobacco, such as cigarettes   and e-cigarettes. If you need help quitting, ask your health care provider. °· Do not use alcohol or drugs. °· Avoid stress as much as possible. Rest and get plenty of sleep. °General instructions °· Take over-the-counter and prescription medicines only as told by your  health care provider. °· When lying down, lie on your side. This keeps pressure off of your baby. °· When sitting or lying down, raise (elevate) your feet. Try putting some pillows underneath your lower legs. °· Exercise regularly. Ask your health care provider what kinds of exercise are best for you. °· Keep all follow-up and prenatal visits as told by your health care provider. This is important. °How is this prevented? °To prevent preeclampsia or eclampsia from developing during another pregnancy: °· Get proper medical care during pregnancy. Your health care provider may be able to prevent preeclampsia or diagnose and treat it early. °· Your health care provider may have you take a low-dose aspirin or a calcium supplement during your next pregnancy. °· You may have tests of your blood pressure and kidney function after giving birth. °· Maintain a healthy weight. Ask your health care provider for help managing weight gain during pregnancy. °· Work with your health care provider to manage any long-term (chronic) health conditions you have, such as diabetes or kidney problems. ° °Contact a health care provider if: °· You gain more weight than expected. °· You have headaches. °· You have nausea or vomiting. °· You have abdominal pain. °· You feel dizzy or light-headed. °Get help right away if: °· You develop sudden or severe swelling anywhere in your body. This usually happens in the legs. °· You gain 5 lbs (2.3 kg) or more during one week. °· You have severe: °? Abdominal pain. °? Headaches. °? Dizziness. °? Vision problems. °? Confusion. °? Nausea or vomiting. °· You have a seizure. °· You have trouble moving any part of your body. °· You develop numbness in any part of your body. °· You have trouble speaking. °· You have any abnormal bleeding. °· You pass out. °This information is not intended to replace advice given to you by your health care provider. Make sure you discuss any questions you have with your health  care provider. °Document Released: 02/25/2000 Document Revised: 10/26/2015 Document Reviewed: 10/04/2015 °Elsevier Interactive Patient Education © 2018 Else[employer] Inc. ° °

## 2017-01-12 NOTE — MAU Note (Signed)
Having braxton hicks ctxs for 4 days. More mucousy d/c for 4 days. Thick d/c. My feet swelling more than usual. Ctxs more in evening.

## 2017-01-12 NOTE — Progress Notes (Signed)
Pt states she does not like having the automatic cuff on serial pressures, she states "they are only high because it hurts." BP cuff removed.

## 2017-01-15 DIAGNOSIS — Z23 Encounter for immunization: Secondary | ICD-10-CM | POA: Diagnosis not present

## 2017-01-15 DIAGNOSIS — Z3483 Encounter for supervision of other normal pregnancy, third trimester: Secondary | ICD-10-CM | POA: Diagnosis not present

## 2017-01-16 ENCOUNTER — Ambulatory Visit: Payer: Self-pay | Admitting: Psychology

## 2017-01-19 ENCOUNTER — Ambulatory Visit: Payer: Self-pay

## 2017-01-21 ENCOUNTER — Inpatient Hospital Stay (HOSPITAL_COMMUNITY)
Admission: AD | Admit: 2017-01-21 | Discharge: 2017-01-21 | Disposition: A | Discharge status: Home / Self Care | Payer: 59 | Source: Ambulatory Visit | Attending: Obstetrics & Gynecology | Admitting: Obstetrics & Gynecology

## 2017-01-21 ENCOUNTER — Encounter (HOSPITAL_COMMUNITY): Payer: Self-pay | Admitting: *Deleted

## 2017-01-21 ENCOUNTER — Other Ambulatory Visit: Payer: Self-pay

## 2017-01-21 DIAGNOSIS — O4703 False labor before 37 completed weeks of gestation, third trimester: Secondary | ICD-10-CM

## 2017-01-21 DIAGNOSIS — Z0371 Encounter for suspected problem with amniotic cavity and membrane ruled out: Secondary | ICD-10-CM

## 2017-01-21 DIAGNOSIS — O368130 Decreased fetal movements, third trimester, not applicable or unspecified: Secondary | ICD-10-CM | POA: Insufficient documentation

## 2017-01-21 DIAGNOSIS — Z3A34 34 weeks gestation of pregnancy: Secondary | ICD-10-CM | POA: Insufficient documentation

## 2017-01-21 DIAGNOSIS — O133 Gestational [pregnancy-induced] hypertension without significant proteinuria, third trimester: Secondary | ICD-10-CM | POA: Diagnosis not present

## 2017-01-21 LAB — URINALYSIS, ROUTINE W REFLEX MICROSCOPIC
Bilirubin Urine: NEGATIVE
Glucose, UA: NEGATIVE mg/dL
Hgb urine dipstick: NEGATIVE
Ketones, ur: NEGATIVE mg/dL
Leukocytes, UA: NEGATIVE
Nitrite: NEGATIVE
Protein, ur: NEGATIVE mg/dL
Specific Gravity, Urine: 1.004 — ABNORMAL LOW (ref 1.005–1.030)
pH: 6 (ref 5.0–8.0)

## 2017-01-21 LAB — COMPREHENSIVE METABOLIC PANEL
ALT: 12 U/L — ABNORMAL LOW (ref 14–54)
AST: 17 U/L (ref 15–41)
Albumin: 2.6 g/dL — ABNORMAL LOW (ref 3.5–5.0)
Alkaline Phosphatase: 154 U/L — ABNORMAL HIGH (ref 38–126)
Anion gap: 8 (ref 5–15)
BUN: 7 mg/dL (ref 6–20)
CO2: 23 mmol/L (ref 22–32)
Calcium: 8.9 mg/dL (ref 8.9–10.3)
Chloride: 104 mmol/L (ref 101–111)
Creatinine, Ser: 0.43 mg/dL — ABNORMAL LOW (ref 0.44–1.00)
GFR calc Af Amer: >60 mL/min (ref >60)
GFR calc non Af Amer: >60 mL/min (ref >60)
Glucose, Bld: 96 mg/dL (ref 65–99)
Potassium: 3.9 mmol/L (ref 3.5–5.1)
Sodium: 135 mmol/L (ref 135–145)
Total Bilirubin: 0.5 mg/dL (ref 0.3–1.2)
Total Protein: 6.2 g/dL — ABNORMAL LOW (ref 6.5–8.1)

## 2017-01-21 LAB — CBC
HCT: 36.9 % (ref 36.0–46.0)
Hemoglobin: 12.3 g/dL (ref 12.0–15.0)
MCH: 32.4 pg (ref 26.0–34.0)
MCHC: 33.3 g/dL (ref 30.0–36.0)
MCV: 97.1 fL (ref 78.0–100.0)
Platelets: 243 10*3/uL (ref 150–400)
RBC: 3.8 MIL/uL — ABNORMAL LOW (ref 3.87–5.11)
RDW: 13.2 % (ref 11.5–15.5)
WBC: 8.7 10*3/uL (ref 4.0–10.5)

## 2017-01-21 LAB — PROTEIN / CREATININE RATIO, URINE
Creatinine, Urine: 23 mg/dL
Total Protein, Urine: <6 mg/dL

## 2017-01-21 LAB — AMNISURE RUPTURE OF MEMBRANE (ROM) NOT AT ARMC: Amnisure ROM: NEGATIVE

## 2017-01-21 NOTE — Progress Notes (Signed)
Baby moving and pt aware of FM 

## 2017-01-21 NOTE — MAU Note (Signed)
Have felt decreased FM for about 3 hours. Have been having Braxton Hicks ctxs for wks but now feeling need to have BM with ctxs. One incidence of urine running down my leg between 2200-2300 last night but nothing since. No bleeding

## 2017-01-21 NOTE — Discharge Instructions (Signed)
Braxton Hicks Contractions °Contractions of the uterus can occur throughout pregnancy, but they are not always a sign that you are in labor. You may have practice contractions called Braxton Hicks contractions. These false labor contractions are sometimes confused with true labor. °What are Braxton Hicks contractions? °Braxton Hicks contractions are tightening movements that occur in the muscles of the uterus before labor. Unlike true labor contractions, these contractions do not result in opening (dilation) and thinning of the cervix. Toward the end of pregnancy (32-34 weeks), Braxton Hicks contractions can happen more often and may become stronger. These contractions are sometimes difficult to tell apart from true labor because they can be very uncomfortable. You should not feel embarrassed if you go to the hospital with false labor. °Sometimes, the only way to tell if you are in true labor is for your health care provider to look for changes in the cervix. The health care provider will do a physical exam and may monitor your contractions. If you are not in true labor, the exam should show that your cervix is not dilating and your water has not broken. °If there are no prenatal problems or other health problems associated with your pregnancy, it is completely safe for you to be sent home with false labor. You may continue to have Braxton Hicks contractions until you go into true labor. °How can I tell the difference between true labor and false labor? °· Differences °? False labor °? Contractions last 30-70 seconds.: Contractions are usually shorter and not as strong as true labor contractions. °? Contractions become very regular.: Contractions are usually irregular. °? Discomfort is usually felt in the top of the uterus, and it spreads to the lower abdomen and low back.: Contractions are often felt in the front of the lower abdomen and in the groin. °? Contractions do not go away with walking.: Contractions may  go away when you walk around or change positions while lying down. °? Contractions usually become more intense and increase in frequency.: Contractions get weaker and are shorter-lasting as time goes on. °? The cervix dilates and gets thinner.: The cervix usually does not dilate or become thin. °Follow these instructions at home: °· Take over-the-counter and prescription medicines only as told by your health care provider. °· Keep up with your usual exercises and follow other instructions from your health care provider. °· Eat and drink lightly if you think you are going into labor. °· If Braxton Hicks contractions are making you uncomfortable: °? Change your position from lying down or resting to walking, or change from walking to resting. °? Sit and rest in a tub of warm water. °? Drink enough fluid to keep your urine clear or pale yellow. Dehydration may cause these contractions. °? Do slow and deep breathing several times an hour. °· Keep all follow-up prenatal visits as told by your health care provider. This is important. °Contact a health care provider if: °· You have a fever. °· You have continuous pain in your abdomen. °Get help right away if: °· Your contractions become stronger, more regular, and closer together. °· You have fluid leaking or gushing from your vagina. °· You pass blood-tinged mucus (bloody show). °· You have bleeding from your vagina. °· You have low back pain that you never had before. °· You feel your baby’s head pushing down and causing pelvic pressure. °· Your baby is not moving inside you as much as it used to. °Summary °· Contractions that occur before labor are   called Braxton Hicks contractions, false labor, or practice contractions.  Braxton Hicks contractions are usually shorter, weaker, farther apart, and less regular than true labor contractions. True labor contractions usually become progressively stronger and regular and they become more frequent.  Manage discomfort from  [facility] contractions by changing position, resting in a warm bath, drinking plenty of water, or practicing deep breathing. This information is not intended to replace advice given to you by your health care provider. Make sure you discuss any questions you have with your health care provider. Document Released: 02/27/2005 Document Revised: 01/17/2016 Document Reviewed: 01/17/2016 Elsevier Interactive Patient Education  2017 Elsevier Inc.   Hypertension During Pregnancy Hypertension, commonly called high blood pressure, is when the force of blood pumping through your arteries is too strong. Arteries are blood vessels that carry blood from the heart throughout the body. Hypertension during pregnancy can cause problems for you and your baby. Your baby may be born early (prematurely) or may not weigh as much as he or she should at birth. Very bad cases of hypertension during pregnancy can be life-threatening. Different types of hypertension can occur during pregnancy. These include:  Chronic hypertension. This happens when: ? You have hypertension before pregnancy and it continues during pregnancy. ? You develop hypertension before you are [redacted] weeks pregnant, and it continues during pregnancy.  Gestational hypertension. This is hypertension that develops after the 20th week of pregnancy.  Preeclampsia, also called toxemia of pregnancy. This is a very serious type of hypertension that develops only during pregnancy. It affects the whole body, and it can be very dangerous for you and your baby.  Gestational hypertension and preeclampsia usually go away within 6 weeks after your baby is born. Women who have hypertension during pregnancy have a greater chance of developing hypertension later in life or during future pregnancies. What are the causes? The exact cause of hypertension is not known. What increases the risk? There are certain factors that make it more likely for you to develop  hypertension during pregnancy. These include:  Having hypertension during a previous pregnancy or prior to pregnancy.  Being overweight.  Being older than age 54.  Being pregnant for the first time or being pregnant with more than one baby.  Becoming pregnant using fertilization methods such as IVF (in vitro fertilization).  Having diabetes, kidney problems, or systemic lupus erythematosus.  Having a family history of hypertension.  What are the signs or symptoms? Chronic hypertension and gestational hypertension rarely cause symptoms. Preeclampsia causes symptoms, which may include:  Increased protein in your urine. Your health care provider will check for this at every visit before you give birth (prenatal visit).  Severe headaches.  Sudden weight gain.  Swelling of the hands, face, legs, and feet.  Nausea and vomiting.  Vision problems, such as blurred or double vision.  Numbness in the face, arms, legs, and feet.  Dizziness.  Slurred speech.  Sensitivity to bright lights.  Abdominal pain.  Convulsions.  How is this diagnosed? You may be diagnosed with hypertension during a routine prenatal exam. At each prenatal visit, you may:  Have a urine test to check for high amounts of protein in your urine.  Have your blood pressure checked. A blood pressure reading is recorded as two numbers, such as "120 over 80" (or 120/80). The first ("top") number is called the systolic pressure. It is a measure of the pressure in your arteries when your heart beats. The second ("bottom") number is called the  diastolic pressure. It is a measure of the pressure in your arteries as your heart relaxes between beats. Blood pressure is measured in a unit called mm Hg. A normal blood pressure reading is: ? Systolic: below 120. ? Diastolic: below 80.  The type of hypertension that you are diagnosed with depends on your test results and when your symptoms developed.  Chronic hypertension  is usually diagnosed before 20 weeks of pregnancy.  Gestational hypertension is usually diagnosed after 20 weeks of pregnancy.  Hypertension with high amounts of protein in the urine is diagnosed as preeclampsia.  Blood pressure measurements that stay above 160 systolic, or above 110 diastolic, are signs of severe preeclampsia.  How is this treated? Treatment for hypertension during pregnancy varies depending on the type of hypertension you have and how serious it is.  If you take medicines called ACE inhibitors to treat chronic hypertension, you may need to switch medicines. ACE inhibitors should not be taken during pregnancy.  If you have gestational hypertension, you may need to take blood pressure medicine.  If you are at risk for preeclampsia, your health care provider may recommend that you take a low-dose aspirin every day to prevent high blood pressure during your pregnancy.  If you have severe preeclampsia, you may need to be hospitalized so you and your baby can be monitored closely. You may also need to take medicine (magnesium sulfate) to prevent seizures and to lower blood pressure. This medicine may be given as an injection or through an IV tube.  In some cases, if your condition gets worse, you may need to deliver your baby early.  Follow these instructions at home: Eating and drinking  Drink enough fluid to keep your urine clear or pale yellow.  Eat a healthy diet that is low in salt (sodium). Do not add salt to your food. Check food labels to see how much sodium a food or beverage contains. Lifestyle  Do not use any products that contain nicotine or tobacco, such as cigarettes and e-cigarettes. If you need help quitting, ask your health care provider.  Do not use alcohol.  Avoid caffeine.  Avoid stress as much as possible. Rest and get plenty of sleep. General instructions  Take over-the-counter and prescription medicines only as told by your health care  provider.  While lying down, lie on your left side. This keeps pressure off your baby.  While sitting or lying down, raise (elevate) your feet. Try putting some pillows under your lower legs.  Exercise regularly. Ask your health care provider what kinds of exercise are best for you.  Keep all prenatal and follow-up visits as told by your health care provider. This is important. Contact a health care provider if:  You have symptoms that your health care provider told you may require more treatment or monitoring, such as: ? Fever. ? Vomiting. ? Headache. Get help right away if:  You have severe abdominal pain or vomiting that does not get better with treatment.  You suddenly develop swelling in your hands, ankles, or face.  You gain 4 lbs (1.8 kg) or more in 1 week.  You develop vaginal bleeding, or you have blood in your urine.  You do not feel your baby moving as much as usual.  You have blurred or double vision.  You have muscle twitching or sudden tightening (spasms).  You have shortness of breath.  Your lips or fingernails turn blue. This information is not intended to replace advice given to you  by your health care provider. Make sure you discuss any questions you have with your health care provider. Document Released: 11/15/2010 Document Revised: 09/17/2015 Document Reviewed: 08/13/2015 Elsevier Interactive Patient Education  [employer]2018 Else[employer] Inc.

## 2017-01-21 NOTE — MAU Provider Note (Signed)
Chief Complaint  Patient presents with  . Contractions  . Decreased Fetal Movement     First Provider Initiated Contact with Patient 01/21/17 0818      S: Vanessa Adams  is a 31 y.o. y.o. year old [id number] female at 1835w4d weeks gestation who presents to MAU reporting decreased fetal movement, one episode of leaking what she thinks was urine last night, increased contractions and pelvic pressure. Was incidentally found to have elevated blood pressure. Positive Hx GHTN in this and previous pregnancy. Current blood pressure medication: None.   Provider on-call for patient's practice unavailable. MAU provider asked to see patient.  Patient is very concerned about the accuracy of Dinamap for blood pressure assessment. States that it causes her blood pressure to be falsely elevated because it makes her anxious, squeezes too tight, causes red marks on her arm and makes her feel nauseous. Is requesting manual cuff.   Associated symptoms: Negative for Headache, vision changes, epigastric pain, fever, chills, urinary complaints. Reports increased frequency of bowel movements, but no diarrhea or blood. Contractions: Irregular, mild-moderate Vaginal bleeding: Denies Fetal movement: Decreased   O:  Patient Vitals for the past 24 hrs:  BP Pulse Height Weight  01/21/17 0830 (!) 156/86 - - -  01/21/17 0818 (!) 164/90 92 - -  01/21/17 0802 (!) 142/82 91 - -  01/21/17 0743 (!) 144/88 94 - -  01/21/17 0714 (!) 150/88 - - -  01/21/17 0656 (!) 141/85 88 - -  01/21/17 0641 (!) 152/87 - - -  01/21/17 0635 - 94 5\' 8"  (1.727 m) 288 lb (130.6 kg)   General: NAD, obese female Heart: Regular rate Lungs: Normal rate and effort Abd: Soft, NT, Gravid, S=D Extremities: Trace Pedal edema Neuro: 2+ deep tendon reflexes, No clonus Pelvic: NEFG, no bleeding or LOF.  Small amount of white discharge. Dilation: Closed Effacement (%): Thick Cervical Position: Posterior Exam by:: v Forrer cnm   EFM: 145,  Moderate variability, 15 x 15 accelerations, no decelerations Toco: Irregular, mild  Results for orders placed or performed during the hospital encounter of 01/21/17 (from the past 24 hour(s))  Urinalysis, Routine w reflex microscopic     Status: Abnormal   Collection Time: 01/21/17  6:25 AM  Result Value Ref Range   Color, Urine STRAW (A) YELLOW   APPearance CLEAR CLEAR   Specific Gravity, Urine 1.004 (L) 1.005 - 1.030   pH 6.0 5.0 - 8.0   Glucose, UA NEGATIVE NEGATIVE mg/dL   Hgb urine dipstick NEGATIVE NEGATIVE   Bilirubin Urine NEGATIVE NEGATIVE   Ketones, ur NEGATIVE NEGATIVE mg/dL   Protein, ur NEGATIVE NEGATIVE mg/dL   Nitrite NEGATIVE NEGATIVE   Leukocytes, UA NEGATIVE NEGATIVE  Protein / creatinine ratio, urine     Status: None   Collection Time: 01/21/17  6:25 AM  Result Value Ref Range   Creatinine, Urine 23.00 mg/dL   Total Protein, Urine <6 mg/dL   Protein Creatinine Ratio Below reportable range  0.00 - 0.15 mg/mg[Cre]  CBC     Status: Abnormal   Collection Time: 01/21/17  7:31 AM  Result Value Ref Range   WBC 8.7 4.0 - 10.5 K/uL   RBC 3.80 (L) 3.87 - 5.11 MIL/uL   Hemoglobin 12.3 12.0 - 15.0 g/dL   HCT 16.136.9 09.636.0 - 04.546.0 %   MCV 97.1 78.0 - 100.0 fL   MCH 32.4 26.0 - 34.0 pg   MCHC 33.3 30.0 - 36.0 g/dL   RDW 40.913.2 81.111.5 - 91.415.5 %  Platelets 243 150 - 400 K/uL  Comprehensive metabolic panel     Status: Abnormal   Collection Time: 01/21/17  7:31 AM  Result Value Ref Range   Sodium 135 135 - 145 mmol/L   Potassium 3.9 3.5 - 5.1 mmol/L   Chloride 104 101 - 111 mmol/L   CO2 23 22 - 32 mmol/L   Glucose, Bld 96 65 - 99 mg/dL   BUN 7 6 - 20 mg/dL   Creatinine, Ser 5.620.43 (L) 0.44 - 1.00 mg/dL   Calcium 8.9 8.9 - 13.010.3 mg/dL   Total Protein 6.2 (L) 6.5 - 8.1 g/dL   Albumin 2.6 (L) 3.5 - 5.0 g/dL   AST 17 15 - 41 U/L   ALT 12 (L) 14 - 54 U/L   Alkaline Phosphatase 154 (H) 38 - 126 U/L   Total Bilirubin 0.5 0.3 - 1.2 mg/dL   GFR calc non Af Amer >60 >60 mL/min   GFR  calc Af Amer >60 >60 mL/min   Anion gap 8 5 - 15  Am[facility]isure rupture [facility] membrane (rom[facility]     Status: None   Collection Time: 01/21/17  7:38 AM  Result Value Ref Range   Amnisure ROM NEGATIVE    MAU course Orders Placed This Encounter  Procedures  . Urinalysis, Routine w reflex microscopic  . CBC  . Comprehensive metabolic panel  . Protein / creatinine ratio, urine  . Am[facility]  . Fetal non-stress test  . Measure blood pressure  . Discharge patient   Patient reports now feeling fetal movement.  Discussed history, exam, labs, pelvic exam, 1 severe range blood pressure followed by non-severe range blood pressure with Dr. Normand Sloopillard. Agrees with plan of care. No new orders now although she will discuss possible data Methasone with Dr. Charlotta Newtonzan report tomorrow due to concerns that patient may need to deliver early if she progresses to preeclampsia with severe features.  MDM - Elevated blood pressure (1 severe range) with normal labs, no symptoms of preeclampsia. Meets criteria for gestational hypertension although patient will need close follow-up for possible development of preeclampsia with severe features. - Decreased fetal movement, resolved with reactive NST. Reassurance given. - Preterm contractions without cervical change consistent with Deberah PeltonBraxton Hicks. Reassurance given. Normal for gestational age. - One episode of leaking what was most likely urine. Negative Amnisure.  A: 2959w4d week IUP FHR reactive 1. Gestational hypertension, third trimester   2. Decreased fetal movement affecting management of pregnancy in third trimester, single or unspecified fetus   3. No leakage of amniotic fluid into vagina   4. Preterm uterine contractions in third trimester, antepartum     P: Discharge home in stable condition per consult with Dr. Normand Sloopillard. Preeclampsia precautions. Follow-up for blood pressure check in 1-2 days at your doctor's office sooner as  needed if symptoms worsen. Return to maternity admissions as needed in emergencies  [name], [place], [place] 01/21/2017 8:53 AM

## 2017-01-29 DIAGNOSIS — Z3483 Encounter for supervision of other normal pregnancy, third trimester: Secondary | ICD-10-CM | POA: Diagnosis not present

## 2017-01-29 LAB — OB RESULTS CONSOLE GBS: GBS: POSITIVE

## 2017-02-14 ENCOUNTER — Inpatient Hospital Stay (HOSPITAL_COMMUNITY)
Admission: AD | Admit: 2017-02-14 | Discharge: 2017-02-16 | DRG: 807 | Disposition: A | Discharge status: Home / Self Care | Payer: 59 | Source: Ambulatory Visit | Attending: Obstetrics and Gynecology | Admitting: Obstetrics and Gynecology

## 2017-02-14 ENCOUNTER — Encounter (HOSPITAL_COMMUNITY): Payer: Self-pay

## 2017-02-14 ENCOUNTER — Other Ambulatory Visit: Payer: Self-pay | Admitting: Obstetrics and Gynecology

## 2017-02-14 DIAGNOSIS — Z87891 Personal history of nicotine dependence: Secondary | ICD-10-CM

## 2017-02-14 DIAGNOSIS — O99344 Other mental disorders complicating childbirth: Secondary | ICD-10-CM | POA: Diagnosis present

## 2017-02-14 DIAGNOSIS — F329 Major depressive disorder, single episode, unspecified: Secondary | ICD-10-CM | POA: Diagnosis present

## 2017-02-14 DIAGNOSIS — Z3A38 38 weeks gestation of pregnancy: Secondary | ICD-10-CM | POA: Diagnosis not present

## 2017-02-14 DIAGNOSIS — O99824 Streptococcus B carrier state complicating childbirth: Secondary | ICD-10-CM | POA: Diagnosis present

## 2017-02-14 DIAGNOSIS — O134 Gestational [pregnancy-induced] hypertension without significant proteinuria, complicating childbirth: Principal | ICD-10-CM | POA: Diagnosis present

## 2017-02-14 DIAGNOSIS — E039 Hypothyroidism, unspecified: Secondary | ICD-10-CM | POA: Diagnosis present

## 2017-02-14 DIAGNOSIS — O139 Gestational [pregnancy-induced] hypertension without significant proteinuria, unspecified trimester: Secondary | ICD-10-CM | POA: Diagnosis present

## 2017-02-14 DIAGNOSIS — O99214 Obesity complicating childbirth: Secondary | ICD-10-CM | POA: Diagnosis present

## 2017-02-14 DIAGNOSIS — F419 Anxiety disorder, unspecified: Secondary | ICD-10-CM | POA: Diagnosis present

## 2017-02-14 DIAGNOSIS — O99284 Endocrine, nutritional and metabolic diseases complicating childbirth: Secondary | ICD-10-CM | POA: Diagnosis not present

## 2017-02-14 HISTORY — DX: Anxiety disorder, unspecified: F41.9

## 2017-02-14 LAB — TYPE AND SCREEN
ABO/RH(D): A POS
Antibody Screen: NEGATIVE

## 2017-02-14 LAB — CBC
HCT: 41.9 % (ref 36.0–46.0)
Hemoglobin: 13.6 g/dL (ref 12.0–15.0)
MCH: 32 pg (ref 26.0–34.0)
MCHC: 32.5 g/dL (ref 30.0–36.0)
MCV: 98.6 fL (ref 78.0–100.0)
Platelets: 263 10*3/uL (ref 150–400)
RBC: 4.25 MIL/uL (ref 3.87–5.11)
RDW: 13.5 % (ref 11.5–15.5)
WBC: 7.7 10*3/uL (ref 4.0–10.5)

## 2017-02-14 LAB — COMPREHENSIVE METABOLIC PANEL
ALT: 14 U/L (ref 14–54)
AST: 20 U/L (ref 15–41)
Albumin: 2.8 g/dL — ABNORMAL LOW (ref 3.5–5.0)
Alkaline Phosphatase: 253 U/L — ABNORMAL HIGH (ref 38–126)
Anion gap: 8 (ref 5–15)
BUN: 8 mg/dL (ref 6–20)
CO2: 23 mmol/L (ref 22–32)
Calcium: 8.8 mg/dL — ABNORMAL LOW (ref 8.9–10.3)
Chloride: 105 mmol/L (ref 101–111)
Creatinine, Ser: 0.44 mg/dL (ref 0.44–1.00)
GFR calc Af Amer: >60 mL/min (ref >60)
GFR calc non Af Amer: >60 mL/min (ref >60)
Glucose, Bld: 73 mg/dL (ref 65–99)
Potassium: 4.1 mmol/L (ref 3.5–5.1)
Sodium: 136 mmol/L (ref 135–145)
Total Bilirubin: 1 mg/dL (ref 0.3–1.2)
Total Protein: 6.5 g/dL (ref 6.5–8.1)

## 2017-02-14 LAB — LACTATE DEHYDROGENASE: LDH: 130 U/L (ref 98–192)

## 2017-02-14 LAB — PROTEIN / CREATININE RATIO, URINE
Creatinine, Urine: 196 mg/dL
Protein Creatinine Ratio: 0.1 mg/mg{Cre} (ref 0.00–0.15)
Total Protein, Urine: 19 mg/dL

## 2017-02-14 LAB — ABO/RH: ABO/RH(D): A POS

## 2017-02-14 LAB — URIC ACID: Uric Acid, Serum: 6.8 mg/dL — ABNORMAL HIGH (ref 2.3–6.6)

## 2017-02-14 MED ORDER — FENTANYL CITRATE (PF) 100 MCG/2ML IJ SOLN
50.0000 ug | INTRAMUSCULAR | Status: DC | PRN
  Administered 2017-02-15 (×2): 100 ug via INTRAVENOUS
  Filled 2017-02-14 (×2): qty 2

## 2017-02-14 MED ORDER — OXYTOCIN 40 UNITS IN LACTATED RINGERS INFUSION - SIMPLE MED
2.5000 [IU]/h | INTRAVENOUS | Status: DC
  Filled 2017-02-14: qty 1000

## 2017-02-14 MED ORDER — OXYTOCIN BOLUS FROM INFUSION
500.0000 mL | Freq: Once | INTRAVENOUS | Status: AC
  Administered 2017-02-15: 500 mL via INTRAVENOUS

## 2017-02-14 MED ORDER — TERBUTALINE SULFATE 1 MG/ML IJ SOLN
0.2500 mg | Freq: Once | INTRAMUSCULAR | Status: DC | PRN
  Filled 2017-02-14: qty 1

## 2017-02-14 MED ORDER — SOD CITRATE-CITRIC ACID 500-334 MG/5ML PO SOLN: 30.0000 mL | ORAL | Status: DC | PRN

## 2017-02-14 MED ORDER — CLINDAMYCIN PHOSPHATE 900 MG/50ML IV SOLN: 900.0000 mg | Freq: Three times a day (TID) | INTRAVENOUS | Status: DC

## 2017-02-14 MED ORDER — LACTATED RINGERS IV SOLN: 500.0000 mL | INTRAVENOUS | Status: DC | PRN

## 2017-02-14 MED ORDER — ONDANSETRON HCL 4 MG/2ML IJ SOLN: 4.0000 mg | Freq: Four times a day (QID) | INTRAMUSCULAR | Status: DC | PRN

## 2017-02-14 MED ORDER — OXYTOCIN 40 UNITS IN LACTATED RINGERS INFUSION - SIMPLE MED: 1.0000 m[IU]/min | INTRAVENOUS | Status: DC

## 2017-02-14 MED ORDER — LIDOCAINE HCL (PF) 1 % IJ SOLN
30.0000 mL | INTRAMUSCULAR | Status: DC | PRN
  Administered 2017-02-15: 30 mL via SUBCUTANEOUS
  Filled 2017-02-14: qty 30

## 2017-02-14 MED ORDER — PENICILLIN G POTASSIUM 5000000 UNITS IJ SOLR
5.0000 10*6.[IU] | Freq: Once | INTRAVENOUS | Status: AC
  Administered 2017-02-14: 5 10*6.[IU] via INTRAVENOUS
  Filled 2017-02-14: qty 5

## 2017-02-14 MED ORDER — LACTATED RINGERS IV SOLN
INTRAVENOUS | Status: DC
  Administered 2017-02-14: 14:00:00 via INTRAVENOUS

## 2017-02-14 MED ORDER — PENICILLIN G POT IN DEXTROSE 60000 UNIT/ML IV SOLN
3.0000 10*6.[IU] | INTRAVENOUS | Status: DC
  Administered 2017-02-14 – 2017-02-15 (×3): 3 10*6.[IU] via INTRAVENOUS
  Filled 2017-02-14 (×6): qty 50

## 2017-02-14 MED ORDER — MISOPROSTOL 25 MCG QUARTER TABLET
25.0000 ug | ORAL_TABLET | ORAL | Status: DC | PRN
  Administered 2017-02-14 (×3): 25 ug via VAGINAL
  Filled 2017-02-14 (×4): qty 1

## 2017-02-14 NOTE — Anesthesia Pain Management Evaluation Note (Signed)
  CRNA Pain Management Visit Note  Patient: Vanessa Adams, 31 y.o., female  "Hello I am a member of the anesthesia team at [facility]. We have an anesthesia team available at all times to provide care throughout the hospital, including epidural management and anesthesia for C-section. I don't know your plan for the delivery whether it a natural birth, water birth, IV sedation, nitrous supplementation, doula or epidural, but we want to meet your pain goals."   1.Was your pain managed to your expectations on prior hospitalizations?   No   2.What is your expectation for pain management during this hospitalization?     Labor support without medications  3.How can we help you reach that goal? unsure  Record the patient's initial score and the patient's pain goal.   Pain: 0  Pain Goal: 10 [facility] wants you to be able to say your pain was always managed very well.  Cephus ShellingBURGER,Raza Bayless 02/14/2017

## 2017-02-14 NOTE — H&P (Signed)
Vanessa Adams is a 31 y.o. female G3 P1011 at 38 wks and 0 days admitted to labor and delivery for induction due to gestational hypertension. She  presented to the office today for routine exam. BP 148/94 repeat 154/100.Marland Kitchen. She had a previous elevated in MAU several weeks ago. She denies headache, visual disturbances or ruq pain. Pregnancy has complicated by hypothyroidism  And anxiety...   Prenatal care provided by Dr. Richardson Doppole [facility]   OB History    Vanessa SacramentoGravida Para Term Preterm AB Living   3 1 1   1 1    SAB TAB Ectopic Multiple Live Births   1       1     Past Medical History:  Diagnosis Date  . Allergy   . Anxiety   . Bell palsy    Bell's Palsy 2015  . Thyroid disease    Past Surgical History:  Procedure Laterality Date  . adenoids    . CHOLECYSTECTOMY  2007   Family History: family history includes Cancer (age of onset: 4845) in her maternal grandmother and mother; Hyperlipidemia in her father; Hypertension in her father. Social History:  reports that she has quit smoking. she has never used smokeless tobacco. She reports that she drinks alcohol. She reports that she does not use drugs.     Maternal Diabetes: No Genetic Screening: Declined Maternal Ultrasounds/Referrals: Normal Fetal Ultrasounds or other Referrals:  None Maternal Substance Abuse:  No Significant Maternal Medications:  Meds include: Zoloft Syntroid Significant Maternal Lab Results:  Lab values include: Group B Strep positive Other Comments:  None  Review of Systems  Constitutional: Negative.   HENT: Negative.   Eyes: Negative.   Respiratory: Negative.   Cardiovascular: Negative.   Gastrointestinal: Negative.   Genitourinary: Negative.   Musculoskeletal: Negative.   Skin: Negative.   Neurological: Negative.   Endo/Heme/Allergies: Negative.   Psychiatric/Behavioral: Negative.    Maternal Medical History:  Fetal activity: Perceived fetal activity is normal.      Dilation: 1.5 Effacement  (%): Thick Station: -3 Exam by:: Dr. Richardson Doppole Blood pressure (!) 145/94, pulse (!) 107, temperature 99.7 F (37.6 C), temperature source Oral, resp. rate 19, height 5\' 8"  (1.727 m), weight 133.8 kg (295 lb), last menstrual period 05/12/2016. Maternal Exam:  Abdomen: Patient reports no abdominal tenderness. Estimated fetal weight is 7 lb 8 oz .   Fetal presentation: vertex  Introitus: Normal vulva. Normal vagina.    Fetal Exam Fetal Monitor Review: Baseline rate: 140.  Variability: moderate (6-25 bpm).   Pattern: accelerations present and no decelerations.    Fetal State Assessment: Category I - tracings are normal.     Physical Exam  Vitals reviewed. Constitutional: She is oriented to person, place, and time. She appears well-developed and well-nourished.  HENT:  Head: Normocephalic and atraumatic.  Eyes: Conjunctivae are normal. Pupils are equal, round, and reactive to light.  Neck: Normal range of motion. Neck supple.  Cardiovascular: Normal rate and regular rhythm.  Respiratory: Effort normal and breath sounds normal.  GI: There is no tenderness.  Genitourinary: Vagina normal.  Musculoskeletal: She exhibits edema.  Neurological: She is alert and oriented to person, place, and time. She has normal reflexes.  Skin: Skin is warm and dry.  Psychiatric: She has a normal mood and affect.    Cervix 1.5 / Thick / High   Prenatal labs: ABO, Rh: --/--/A POS (12/05 1335) Antibody: NEG (12/05 1335) Rubella:  Immune  RPR:   Nonreactive  HBsAg:  Negative   HIV:   Negative GBS:   Positive   Assessment/Plan: 38 wks and 0 days with gestational hypertension  Penicillin for gbs prophylaxis.  Cytotec for induction  Anticipate SVD  Hypothyroidism - continue levothyroxine 74 mcg daily  Anxiety - continue zoloft 25 mg daily    Curley Hogen J. 02/14/2017, 6:53 PM

## 2017-02-15 ENCOUNTER — Inpatient Hospital Stay (HOSPITAL_COMMUNITY): Payer: 59 | Admitting: Anesthesiology

## 2017-02-15 ENCOUNTER — Encounter (HOSPITAL_COMMUNITY): Payer: Self-pay | Admitting: Anesthesiology

## 2017-02-15 LAB — CBC
HCT: 39.1 % (ref 36.0–46.0)
Hemoglobin: 12.7 g/dL (ref 12.0–15.0)
MCH: 31.8 pg (ref 26.0–34.0)
MCHC: 32.5 g/dL (ref 30.0–36.0)
MCV: 98 fL (ref 78.0–100.0)
Platelets: 234 10*3/uL (ref 150–400)
RBC: 3.99 MIL/uL (ref 3.87–5.11)
RDW: 13.3 % (ref 11.5–15.5)
WBC: 10.7 10*3/uL — ABNORMAL HIGH (ref 4.0–10.5)

## 2017-02-15 LAB — RPR: RPR Ser Ql: NONREACTIVE

## 2017-02-15 MED ORDER — ONDANSETRON HCL 4 MG/2ML IJ SOLN: 4.0000 mg | INTRAMUSCULAR | Status: DC | PRN

## 2017-02-15 MED ORDER — PRENATAL MULTIVITAMIN CH
1.0000 | ORAL_TABLET | Freq: Every day | ORAL | Status: DC
  Administered 2017-02-15: 1 via ORAL
  Filled 2017-02-15: qty 1

## 2017-02-15 MED ORDER — ONDANSETRON HCL 4 MG PO TABS: 4.0000 mg | ORAL_TABLET | ORAL | Status: DC | PRN

## 2017-02-15 MED ORDER — ACETAMINOPHEN 325 MG PO TABS: 650.0000 mg | ORAL_TABLET | ORAL | Status: DC | PRN

## 2017-02-15 MED ORDER — DIPHENHYDRAMINE HCL 50 MG/ML IJ SOLN: 12.5000 mg | INTRAMUSCULAR | Status: DC | PRN

## 2017-02-15 MED ORDER — SENNOSIDES-DOCUSATE SODIUM 8.6-50 MG PO TABS: 2.0000 | ORAL_TABLET | ORAL | Status: DC

## 2017-02-15 MED ORDER — EPHEDRINE 5 MG/ML INJ
10.0000 mg | INTRAVENOUS | Status: DC | PRN
  Filled 2017-02-15: qty 2

## 2017-02-15 MED ORDER — CALCIUM CARBONATE ANTACID 500 MG PO CHEW: 2.0000 | CHEWABLE_TABLET | Freq: Two times a day (BID) | ORAL | Status: DC | PRN

## 2017-02-15 MED ORDER — ZOLPIDEM TARTRATE 5 MG PO TABS: 5.0000 mg | ORAL_TABLET | Freq: Every evening | ORAL | Status: DC | PRN

## 2017-02-15 MED ORDER — COCONUT OIL OIL: 1.0000 "application " | TOPICAL_OIL | Status: DC | PRN

## 2017-02-15 MED ORDER — LACTATED RINGERS IV SOLN
500.0000 mL | Freq: Once | INTRAVENOUS | Status: AC
  Administered 2017-02-15: 500 mL via INTRAVENOUS

## 2017-02-15 MED ORDER — BENZOCAINE-MENTHOL 20-0.5 % EX AERO
1.0000 "application " | INHALATION_SPRAY | CUTANEOUS | Status: DC | PRN
  Administered 2017-02-15: 1 via TOPICAL
  Filled 2017-02-15: qty 56

## 2017-02-15 MED ORDER — FENTANYL 2.5 MCG/ML BUPIVACAINE 1/10 % EPIDURAL INFUSION (WH - ANES)
14.0000 mL/h | INTRAMUSCULAR | Status: DC | PRN
  Administered 2017-02-15: 14 mL/h via EPIDURAL
  Filled 2017-02-15: qty 100

## 2017-02-15 MED ORDER — WITCH HAZEL-GLYCERIN EX PADS: 1.0000 "application " | MEDICATED_PAD | CUTANEOUS | Status: DC | PRN

## 2017-02-15 MED ORDER — PHENYLEPHRINE 40 MCG/ML (10ML) SYRINGE FOR IV PUSH (FOR BLOOD PRESSURE SUPPORT)
80.0000 ug | PREFILLED_SYRINGE | INTRAVENOUS | Status: DC | PRN
  Filled 2017-02-15: qty 5

## 2017-02-15 MED ORDER — FERROUS SULFATE 325 (65 FE) MG PO TABS
325.0000 mg | ORAL_TABLET | Freq: Two times a day (BID) | ORAL | Status: DC
  Administered 2017-02-15: 325 mg via ORAL
  Filled 2017-02-15: qty 1

## 2017-02-15 MED ORDER — MAGNESIUM HYDROXIDE 400 MG/5ML PO SUSP: 30.0000 mL | ORAL | Status: DC | PRN

## 2017-02-15 MED ORDER — FAMOTIDINE 20 MG PO TABS
20.0000 mg | ORAL_TABLET | Freq: Two times a day (BID) | ORAL | Status: DC
  Filled 2017-02-15: qty 1

## 2017-02-15 MED ORDER — DIPHENHYDRAMINE HCL 25 MG PO CAPS: 25.0000 mg | ORAL_CAPSULE | Freq: Four times a day (QID) | ORAL | Status: DC | PRN

## 2017-02-15 MED ORDER — SIMETHICONE 80 MG PO CHEW: 80.0000 mg | CHEWABLE_TABLET | ORAL | Status: DC | PRN

## 2017-02-15 MED ORDER — MISOPROSTOL 200 MCG PO TABS
ORAL_TABLET | ORAL | Status: AC
  Administered 2017-02-15: 1000 ug
  Filled 2017-02-15: qty 5

## 2017-02-15 MED ORDER — SERTRALINE HCL 25 MG PO TABS
25.0000 mg | ORAL_TABLET | Freq: Every day | ORAL | Status: DC
  Administered 2017-02-15: 25 mg via ORAL
  Filled 2017-02-15 (×3): qty 1

## 2017-02-15 MED ORDER — IBUPROFEN 600 MG PO TABS
600.0000 mg | ORAL_TABLET | Freq: Four times a day (QID) | ORAL | Status: DC
  Administered 2017-02-15 (×2): 600 mg via ORAL
  Filled 2017-02-15 (×3): qty 1

## 2017-02-15 MED ORDER — LEVOTHYROXINE SODIUM 75 MCG PO TABS
75.0000 ug | ORAL_TABLET | Freq: Every day | ORAL | Status: DC
  Administered 2017-02-15: 75 ug via ORAL
  Filled 2017-02-15 (×3): qty 1

## 2017-02-15 MED ORDER — PHENYLEPHRINE 40 MCG/ML (10ML) SYRINGE FOR IV PUSH (FOR BLOOD PRESSURE SUPPORT)
80.0000 ug | PREFILLED_SYRINGE | INTRAVENOUS | Status: DC | PRN
  Filled 2017-02-15: qty 10
  Filled 2017-02-15: qty 5

## 2017-02-15 MED ORDER — LIDOCAINE HCL (PF) 1 % IJ SOLN
INTRAMUSCULAR | Status: DC | PRN
  Administered 2017-02-15: 8 mL via EPIDURAL
  Administered 2017-02-15: 3 mL via EPIDURAL

## 2017-02-15 MED ORDER — DIBUCAINE 1 % RE OINT: 1.0000 "application " | TOPICAL_OINTMENT | RECTAL | Status: DC | PRN

## 2017-02-15 NOTE — Anesthesia Postprocedure Evaluation (Signed)
Anesthesia Post Note  Patient: Vanessa Adams  Procedure(s) Performed: AN AD HOC LABOR EPIDURAL     Patient location during evaluation: Mother Baby Anesthesia Type: Epidural Level of consciousness: awake Pain management: pain level controlled Vital Signs Assessment: post-procedure vital signs reviewed and stable Respiratory status: spontaneous breathing Cardiovascular status: stable Postop Assessment: no headache, no backache, epidural receding, patient able to bend at knees, no apparent nausea or vomiting and adequate PO intake Anesthetic complications: no    Last Vitals:  Vitals:   02/15/17 0930 02/15/17 1035  BP: 125/68 137/80  Pulse: 86 100  Resp: 18 19  Temp: 36.9 C 36.8 C    Last Pain:  Vitals:   02/15/17 1035  TempSrc: Oral  PainSc:    Pain Goal:                 Maurica Omura

## 2017-02-15 NOTE — Anesthesia Preprocedure Evaluation (Signed)
Anesthesia Evaluation  Patient identified by MRN, date of birth, ID band Patient awake    Reviewed: Allergy & Precautions, H&P , NPO status , Patient's Chart, lab work & pertinent test results  Airway Mallampati: III  TM Distance: >3 FB Neck ROM: full    Dental no notable dental hx. (+) Teeth Intact   Pulmonary former smoker,    Pulmonary exam normal breath sounds clear to auscultation       Cardiovascular Normal cardiovascular exam Rhythm:regular Rate:Normal     Neuro/Psych    GI/Hepatic negative GI ROS, Neg liver ROS,   Endo/Other  Hypothyroidism Morbid obesity  Renal/GU negative Renal ROS     Musculoskeletal   Abdominal (+) + obese,   Peds  Hematology negative hematology ROS (+)   Anesthesia Other Findings   Reproductive/Obstetrics (+) Pregnancy                             Anesthesia Physical Anesthesia Plan  ASA: III  Anesthesia Plan: Epidural   Post-op Pain Management:    Induction:   PONV Risk Score and Plan:   Airway Management Planned:   Additional Equipment:   Intra-op Plan:   Post-operative Plan:   Informed Consent: I have reviewed the patients History and Physical, chart, labs and discussed the procedure including the risks, benefits and alternatives for the proposed anesthesia with the patient or authorized representative who has indicated his/her understanding and acceptance.     Plan Discussed with:   Anesthesia Plan Comments:         Anesthesia Quick Evaluation

## 2017-02-15 NOTE — Anesthesia Procedure Notes (Signed)
Epidural Patient location during procedure: OB Start time: 02/15/2017 5:50 AM End time: 02/15/2017 5:53 AM  Staffing Anesthesiologist: Leilani AbleHatchett, Mialee Weyman, MD Performed: anesthesiologist   Preanesthetic Checklist Completed: patient identified, surgical consent, pre-op evaluation, timeout performed, IV checked, risks and benefits discussed and monitors and equipment checked  Epidural Patient position: sitting Prep: site prepped and draped and DuraPrep Patient monitoring: continuous pulse ox and blood pressure Approach: midline Location: L3-L4 Injection technique: LOR air  Needle:  Needle type: Tuohy  Needle gauge: 17 G Needle length: 9 cm and 9 Needle insertion depth: 9 cm Catheter type: closed end flexible Catheter size: 19 Gauge Catheter at skin depth: 15 cm Test dose: negative and Other  Assessment Sensory level: T10 Events: blood not aspirated, injection not painful, no injection resistance, negative IV test and no paresthesia  Additional Notes Reason for block:procedure for pain

## 2017-02-15 NOTE — Lactation Note (Signed)
This note was copied from a baby's chart. Lactation Consultation Note  Patient Name: Boy Lysle Rubenslexandra Ulrich ONGEX'BToday's Date: 02/15/2017 Reason for consult: Initial assessment   P2, Ex BF 6 months.  Baby sleeping STS mother's chest. Mother demonstrated hand expression Mom encouraged to feed baby 8-12 times/24 hours and with feeding cues.  Mom made aware of O/P services, breastfeeding support groups, community resources, and our phone # for post-discharge questions.     Maternal Data Has patient been taught Hand Expression?: Yes Does the patient have breastfeeding experience prior to this delivery?: Yes  Feeding Feeding Type: Breast Fed  LATCH Score                   Interventions Interventions: Hand express  Lactation Tools Discussed/Used     Consult Status Consult Status: Follow-up Date: 02/16/17 Follow-up type: In-patient    Dahlia ByesBerkelhammer, Fardowsa [facility] 02/15/2017, 3:53 PM

## 2017-02-16 ENCOUNTER — Encounter (HOSPITAL_COMMUNITY): Payer: Self-pay | Admitting: *Deleted

## 2017-02-16 LAB — CBC
HCT: 33.2 % — ABNORMAL LOW (ref 36.0–46.0)
Hemoglobin: 11 g/dL — ABNORMAL LOW (ref 12.0–15.0)
MCH: 32.6 pg (ref 26.0–34.0)
MCHC: 33.1 g/dL (ref 30.0–36.0)
MCV: 98.5 fL (ref 78.0–100.0)
Platelets: 207 10*3/uL (ref 150–400)
RBC: 3.37 MIL/uL — ABNORMAL LOW (ref 3.87–5.11)
RDW: 13.6 % (ref 11.5–15.5)
WBC: 10 10*3/uL (ref 4.0–10.5)

## 2017-02-16 MED ORDER — IBUPROFEN 600 MG PO TABS: 600.0000 mg | ORAL_TABLET | Freq: Four times a day (QID) | ORAL | 1 refills | Status: DC | PRN

## 2017-02-16 NOTE — Discharge Instructions (Signed)

## 2017-02-16 NOTE — Plan of Care (Signed)
POC discussed with pt, no concerns at this time.   

## 2017-02-16 NOTE — Progress Notes (Signed)
MOB was referred for history of depression/anxiety. * Referral screened out by Clinical Social Worker because none of the following criteria appear to apply: ~ History of anxiety/depression during this pregnancy, or of post-partum depression. ~ Diagnosis of anxiety and/or depression within last 3 years OR * MOB's symptoms currently being treated with medication and/or therapy; MOB currently taking Zoloft.   Please contact the Clinical Social Worker if needs arise, by MOB request, or if MOB scores greater than 9/yes to question 10 on Edinburgh Postpartum Depression Screen.  Edithe Dobbin Boyd-Gilyard, MSW, LCSW Clinical Social Work [phone]    

## 2017-02-16 NOTE — Discharge Summary (Signed)
OB Discharge Summary     Patient Name: Vanessa Adams DOB: 13-Mar-1986 MRN: [id number]  Date of admission: 02/14/2017 Delivering MD: Gerald LeitzOLE, Orlena Garmon   Date of discharge: 02/16/2017  Admitting diagnosis: INDUCTION Intrauterine pregnancy: 5743w1d     Secondary diagnosis:  Active Problems:   Gestational hypertension hypothyroidism Depression and anxiety  Additional problems: None     Discharge diagnosis: Term Pregnancy Delivered and Gestational Hypertension                                                                                                Post partum procedures:None  Augmentation: Cytotec  Complications: None  Hospital course:  Induction of Labor With Vaginal Delivery   31 y.o. yo [id number] at 4043w1d was admitted to the hospital 02/14/2017 for induction of labor.  Indication for induction: Gestational hypertension.  Patient had an uncomplicated labor course as follows: Membrane Rupture Time/Date: 7:00 AM ,02/15/2017   Intrapartum Procedures: Episiotomy: None [1]                                         Lacerations:  2nd degree [3];Perineal [11]  Patient had delivery of a Viable infant.  Information for the patient's newborn:  Annabell SabalSmith, Boy Rehana [[id number]][[id number]]  Delivery Method: Vaginal, Spontaneous(Filed from Delivery Summary)   02/15/2017  Details of delivery can be found in separate delivery note.  Patient had a routine postpartum course. Patient is discharged home 02/16/17.  Physical exam  Vitals:   02/15/17 0930 02/15/17 1035 02/15/17 1430 02/16/17 0635  BP: 125/68 137/80 137/66 139/74  Pulse: 86 100 90 85  Resp: 18 19 19 20   Temp: 98.4 F (36.9 C) 98.2 F (36.8 C) 98.3 F (36.8 C) 98.2 F (36.8 C)  TempSrc: Oral Oral Oral Oral  SpO2:      Weight:      Height:       General: alert, cooperative and no distress Lochia: appropriate Uterine Fundus: firm Incision: N/A DVT Evaluation: No evidence of DVT seen on physical exam. Labs: Lab Results  Component Value  Date   WBC 10.0 02/16/2017   HGB 11.0 (L) 02/16/2017   HCT 33.2 (L) 02/16/2017   MCV 98.5 02/16/2017   PLT 207 02/16/2017   CMP Latest Ref Rng & Units 02/14/2017  Glucose 65 - 99 mg/dL 73  BUN 6 - 20 mg/dL 8  Creatinine 1.300.44 - 8.651.00 mg/dL 7.840.44  Sodium 696135 - 295145 mmol/L 136  Potassium 3.5 - 5.1 mmol/L 4.1  Chloride 101 - 111 mmol/L 105  CO2 22 - 32 mmol/L 23  Calcium 8.9 - 10.3 mg/dL 2.8(U8.8(L)  Total Protein 6.5 - 8.1 g/dL 6.5  Total Bilirubin 0.3 - 1.2 mg/dL 1.0  Alkaline Phos 38 - 126 U/L 253(H)  AST 15 - 41 U/L 20  ALT 14 - 54 U/L 14    Discharge instruction: per After Visit Summary and "Baby and Me Booklet".  After visit meds:  Allergies as of 02/16/2017      Reactions  Sulfa Antibiotics    stevens-johnsons    Tuberculin Tests Hives   Percocet [oxycodone-acetaminophen] Itching   Vicodin [hydrocodone-acetaminophen] Itching      Medication List    TAKE these medications   calcium carbonate 500 MG chewable tablet Commonly known as:  TUMS - dosed in mg elemental calcium Chew 2 tablets by mouth 2 (two) times daily as needed for indigestion or heartburn.   famotidine 20 MG tablet Commonly known as:  PEPCID Take 20 mg by mouth 2 (two) times daily.   ibuprofen 600 MG tablet Commonly known as:  ADVIL,MOTRIN Take 1 tablet (600 mg total) by mouth every 6 (six) hours as needed.   levothyroxine 75 MCG tablet Commonly known as:  SYNTHROID, LEVOTHROID Take 75 mcg by mouth daily.   prenatal multivitamin Tabs tablet Take 1 tablet by mouth daily at 12 noon.   sertraline 50 MG tablet Commonly known as:  ZOLOFT Take 1 tablet (50 mg total) by mouth daily. What changed:  how much to take       Diet: routine diet  Activity: Advance as tolerated. Pelvic rest for 6 weeks.   Outpatient follow up:1 week for bp check and in 2 wks to evaluate for postpartum depression  Follow up Appt:No future appointments. Follow up Visit:No Follow-up on file.  Postpartum contraception: Not  Discussed  Newborn Data: Live born female  Birth Weight: 9 lb 1 oz (4110 g) APGAR: 7, 9  Newborn Delivery   Birth date/time:  02/15/2017 07:03:00 Delivery type:  Vaginal, Spontaneous     Baby Feeding: Breast Disposition:home with mother   02/16/2017 Jessee AversOLE,Terrion Poblano J., MD

## 2017-02-16 NOTE — Plan of Care (Signed)
POC discussed with pt

## 2017-03-03 ENCOUNTER — Ambulatory Visit (INDEPENDENT_AMBULATORY_CARE_PROVIDER_SITE_OTHER): Payer: 59 | Admitting: Family Medicine

## 2017-03-03 ENCOUNTER — Encounter: Payer: Self-pay | Admitting: Family Medicine

## 2017-03-03 VITALS — BP 156/100 | HR 90 | Temp 98.5°F | Ht 68.0 in | Wt 260.0 lb

## 2017-03-03 DIAGNOSIS — R030 Elevated blood-pressure reading, without diagnosis of hypertension: Secondary | ICD-10-CM | POA: Diagnosis not present

## 2017-03-03 DIAGNOSIS — N61 Mastitis without abscess: Secondary | ICD-10-CM | POA: Diagnosis not present

## 2017-03-03 MED ORDER — DICLOXACILLIN SODIUM 500 MG PO CAPS: 500.0000 mg | ORAL_CAPSULE | Freq: Four times a day (QID) | ORAL | 0 refills | Status: DC

## 2017-03-03 MED ORDER — DICLOXACILLIN SODIUM 500 MG PO CAPS
500.0000 mg | ORAL_CAPSULE | Freq: Four times a day (QID) | ORAL | 0 refills | Status: DC
Start: 2017-03-03 — End: 2017-12-26

## 2017-03-03 NOTE — Patient Instructions (Signed)
Mastitis  Mastitis is inflammation of the breast tissue. It occurs most often in women who are breastfeeding, but it can also affect other women, and even sometimes men.  What are the causes?  Mastitis is usually caused by a bacterial infection. Bacteria enter the breast tissue through cuts or openings in the skin. Typically, this occurs with breastfeeding because of cracked or irritated skin. Sometimes, it can occur even when there is no opening in the skin. It can be associated with plugged milk (lactiferous) ducts. Nipple piercing can also lead to mastitis. Also, some forms of breast cancer can cause mastitis.  What are the signs or symptoms?  · Swelling, redness, tenderness, and pain in an area of the breast.  · Swelling of the glands under the arm on the same side.  · Fever.  If an infection is allowed to progress, a collection of pus (abscess) may develop.  How is this diagnosed?  Your health care provider can usually diagnose mastitis based on your symptoms and a physical exam. Tests may be done to help confirm the diagnosis. These may include:  · Removal of pus from the breast by applying pressure to the area. This pus can be examined in the lab to determine which bacteria are present. If an abscess has developed, the fluid in the abscess can be removed with a needle. This can also be used to confirm the diagnosis and determine the bacteria present. In most cases, pus will not be present.  · Blood tests to determine if your body is fighting a bacterial infection.  · Mammogram or ultrasound tests to rule out other problems or diseases.    How is this treated?  Antibiotic medicine is used to treat a bacterial infection. Your health care provider will determine which bacteria are most likely causing the infection and will select an appropriate antibiotic. This is sometimes changed based on the results of tests performed to identify the bacteria, or if there is no response to the antibiotic selected. Antibiotics  are usually given by mouth. You may also be given medicine for pain.  Mastitis that occurs with breastfeeding will sometimes go away on its own, so your health care provider may choose to wait 24 hours after first seeing you to decide whether a prescription medicine is needed.  Follow these instructions at home:  · Only take over-the-counter or prescription medicines for pain, fever, or discomfort as directed by your health care provider.  · If your health care provider prescribed an antibiotic, take the medicine as directed. Make sure you finish it even if you start to feel better.  · Do not wear a tight or underwire bra. Wear a soft, supportive bra.  · Increase your fluid intake, especially if you have a fever.  · Women who are breastfeeding should follow these instructions:  ? Continue to empty the breast. Your health care provider can tell you whether this milk is safe for your infant or needs to be thrown out. You may be told to stop nursing until your health care provider thinks it is safe for your baby. Use a breast pump if you are advised to stop nursing.  ? Keep your nipples clean and dry.  ? Empty the first breast completely before going to the other breast. If your baby is not emptying your breasts completely for some reason, use a breast pump to empty your breasts.  ? If you go back to work, pump your breasts while at work to stay   in time with your nursing schedule.  ? Avoid allowing your breasts to become overly filled with milk (engorged).  Contact a health care provider if:  · You have pus-like discharge from the breast.  · Your symptoms do not improve with the treatment prescribed by your health care provider within 2 days.  Get help right away if:  · Your pain and swelling are getting worse.  · You have pain that is not controlled with medicine.  · You have a red line extending from the breast toward your armpit.  · You have a fever or persistent symptoms for more than 2-3 days.  · You have a fever  and your symptoms suddenly get worse.  This information is not intended to replace advice given to you by your health care provider. Make sure you discuss any questions you have with your health care provider.  Document Released: 02/27/2005 Document Revised: 08/05/2015 Document Reviewed: 09/27/2012  Elsevier Interactive Patient Education © 2017 [employer]vier Inc[employer]

## 2017-03-03 NOTE — Progress Notes (Signed)
   Subjective:    Patient ID: Rogelio SeenAlexandra A Chilcote, female    DOB: 1985/08/16, 31 y.o.   MRN: [id number]  HPI This is a 31 yo female who presents today with bilateral breast pain that started yesteray. Had vaginal delivery 02/14/17. Is currently breastfeeding.Was unable to get appointment with ob.   Has itching, pain and warmth on both breasts, worse on right breast. Has a small red, firm area on right breast that decreases after feeding/pumping. Baby has been having difficulty latching on and she is doing a mix of breast feeding and pumping every 2-3 hours. No fever or chills. Felt a little nauseous yesterday, resolved. Decreased appetite for 1 week.   Past Medical History:  Diagnosis Date  . Allergy   . Anxiety   . Bell palsy    Bell's Palsy 2015  . Thyroid disease    Past Surgical History:  Procedure Laterality Date  . adenoids    . CHOLECYSTECTOMY  2007   Family History  Problem Relation Age of Onset  . Cancer Mother 2845  . Hyperlipidemia Father   . Hypertension Father   . Cancer Maternal Grandmother 45       ovarian cancer   Social History   Tobacco Use  . Smoking status: Former Games developermoker  . Smokeless tobacco: Never Used  . Tobacco comment: quit at age 31  Substance Use Topics  . Alcohol use: Yes    Comment: rare  . Drug use: No      Review of Systems Per HPI    Objective:   Physical Exam  Constitutional: She appears well-developed and well-nourished. No distress.  Cardiovascular: Normal rate.  Pulmonary/Chest: Effort normal.    Skin: She is not diaphoretic.  Vitals reviewed.    BP (!) 156/100 (BP Location: Left Arm, Patient Position: Sitting, Cuff Size: Large)   Pulse 90   Temp 98.5 F (36.9 C) (Oral)   Ht 5\' 8"  (1.727 m)   Wt 260 lb (117.9 kg)   LMP 05/12/2016 (Approximate)   SpO2 96%   Breastfeeding? Yes   BMI 39.53 kg/m  Wt Readings from Last 3 Encounters:  03/03/17 260 lb (117.9 kg)  02/14/17 295 lb (133.8 kg)  01/21/17 288 lb (130.6 kg)        Assessment & Plan:  1. Mastitis - Provided written and verbal information regarding diagnosis and treatment. - RTC/ER precautions reviewed - Early onset, she was instructed to continue frequent feedings/pumpings every 2-3 hours, apply heat/ice for comfort. She was provided antibiotic to start if not better or symptoms worsen - dicloxacillin (DYNAPEN) 500 MG capsule; Take 1 capsule (500 mg total) by mouth 4 (four) times daily.  Dispense: 28 capsule; Refill: 0  2. Elevated blood pressure reading - was not rechecked while patient in office. Will call her on Monday and have her come in for recheck  Olean Reeeborah Gessner, FNP-BC  [facility]  03/04/2017 6:18 AM

## 2017-03-05 ENCOUNTER — Telehealth: Payer: Self-pay | Admitting: Emergency Medicine

## 2017-03-05 NOTE — Telephone Encounter (Signed)
Called and spoke with patient, informing her of a BP follow up appointment recommended by Np. Leone PayorGessner. Patient states that she will call the office back at her convenience and schedule a follow up appointment with Dr. Dayton MartesAron.

## 2017-03-07 ENCOUNTER — Ambulatory Visit (INDEPENDENT_AMBULATORY_CARE_PROVIDER_SITE_OTHER): Payer: 59 | Admitting: Psychology

## 2017-03-07 DIAGNOSIS — F4323 Adjustment disorder with mixed anxiety and depressed mood: Secondary | ICD-10-CM

## 2017-04-17 ENCOUNTER — Ambulatory Visit: Payer: Self-pay | Admitting: Psychology

## 2017-07-16 ENCOUNTER — Ambulatory Visit: Payer: Self-pay | Admitting: Psychology

## 2017-08-07 ENCOUNTER — Ambulatory Visit: Payer: Self-pay | Admitting: Internal Medicine

## 2017-08-30 ENCOUNTER — Encounter: Payer: 59 | Admitting: Internal Medicine

## 2017-08-30 ENCOUNTER — Ambulatory Visit: Payer: Self-pay | Admitting: Internal Medicine

## 2017-08-30 ENCOUNTER — Other Ambulatory Visit: Payer: Self-pay

## 2017-08-30 DIAGNOSIS — Z0289 Encounter for other administrative examinations: Secondary | ICD-10-CM

## 2017-08-30 MED ORDER — LEVONORGEST-ETH ESTRAD 91-DAY 0.15-0.03 MG PO TABS: 1.0000 | ORAL_TABLET | Freq: Every day | ORAL | 0 refills | Status: DC

## 2017-08-30 MED ORDER — SERTRALINE HCL 50 MG PO TABS: 25.0000 mg | ORAL_TABLET | Freq: Every day | ORAL | 0 refills | Status: DC

## 2017-08-30 MED ORDER — LEVOTHYROXINE SODIUM 75 MCG PO TABS: 75.0000 ug | ORAL_TABLET | Freq: Every day | ORAL | 0 refills | Status: DC

## 2017-08-30 NOTE — Telephone Encounter (Signed)
Pt last saw you 06/2016--has had a baby and is est care with Eunice BlaseDebbie next month... Pt requesting 1 refill of meds... Please advise

## 2017-09-10 ENCOUNTER — Encounter: Payer: 59 | Admitting: Family Medicine

## 2017-09-10 DIAGNOSIS — Z0289 Encounter for other administrative examinations: Secondary | ICD-10-CM

## 2017-11-02 ENCOUNTER — Other Ambulatory Visit: Payer: Self-pay | Admitting: Family Medicine

## 2017-11-06 ENCOUNTER — Ambulatory Visit: Payer: 59 | Admitting: Family Medicine

## 2017-11-09 ENCOUNTER — Other Ambulatory Visit: Payer: Self-pay | Admitting: Family Medicine

## 2017-11-30 ENCOUNTER — Ambulatory Visit: Payer: 59 | Admitting: Psychology

## 2017-12-04 ENCOUNTER — Other Ambulatory Visit: Payer: Self-pay | Admitting: Family Medicine

## 2017-12-05 ENCOUNTER — Telehealth: Payer: Self-pay | Admitting: Family Medicine

## 2017-12-05 NOTE — Telephone Encounter (Signed)
Pt last seen by Dr Dayton Martes 06/30/16. Medications refilled 08/30/17 for 30 days.  Pt has appointment scheduled for 12/18/17.  Please advise

## 2017-12-05 NOTE — Telephone Encounter (Signed)
Copied from CRM [id number]. Topic: Quick Communication - Rx Refill/Question >> Dec 05, 2017  4:30 PM Avie Arenas L, NT wrote: Medication: sertraline (ZOLOFT) 50 MG tablet ,levothyroxine (SYNTHROID, LEVOTHROID) 75 MCG tablet and also levonorgestrel-ethinyl estradiol (SEASONALE,INTROVALE,JOLESSA) 0.15-0.03 MG tablet  Has the patient contacted their pharmacy? yes  (Agent: If no, request that the patient contact the pharmacy for the refill. (Agent: If yes, when and what did the pharmacy advise?  Preferred Pharmacy (with phone number or street name [facility] - [place], [place] - [address] [phone] (Phone) [phone] (Fax)    Agent: Please be advised that RX refills may take up to 3 business days. We ask that you follow-up with your pharmacy.   The patient has an appointment for a med check on 12/18/17

## 2017-12-06 MED ORDER — LEVOTHYROXINE SODIUM 75 MCG PO TABS: 75.0000 ug | ORAL_TABLET | Freq: Every day | ORAL | 0 refills | Status: DC

## 2017-12-06 MED ORDER — LEVONORGEST-ETH ESTRAD 91-DAY 0.15-0.03 MG PO TABS: 1.0000 | ORAL_TABLET | Freq: Every day | ORAL | 0 refills | Status: DC

## 2017-12-06 MED ORDER — SERTRALINE HCL 50 MG PO TABS: 25.0000 mg | ORAL_TABLET | Freq: Every day | ORAL | 0 refills | Status: DC

## 2017-12-06 NOTE — Telephone Encounter (Signed)
Sent in 30d of each req med until OV/thx dmf

## 2017-12-18 ENCOUNTER — Ambulatory Visit: Payer: 59 | Admitting: Family Medicine

## 2017-12-26 ENCOUNTER — Ambulatory Visit (INDEPENDENT_AMBULATORY_CARE_PROVIDER_SITE_OTHER): Payer: 59 | Admitting: Family Medicine

## 2017-12-26 ENCOUNTER — Encounter: Payer: Self-pay | Admitting: Family Medicine

## 2017-12-26 VITALS — BP 136/90 | HR 83 | Temp 98.7°F | Ht 68.0 in | Wt 269.0 lb

## 2017-12-26 DIAGNOSIS — Z23 Encounter for immunization: Secondary | ICD-10-CM | POA: Diagnosis not present

## 2017-12-26 DIAGNOSIS — F329 Major depressive disorder, single episode, unspecified: Secondary | ICD-10-CM

## 2017-12-26 DIAGNOSIS — E781 Pure hyperglyceridemia: Secondary | ICD-10-CM

## 2017-12-26 DIAGNOSIS — Z309 Encounter for contraceptive management, unspecified: Secondary | ICD-10-CM | POA: Insufficient documentation

## 2017-12-26 DIAGNOSIS — Z30011 Encounter for initial prescription of contraceptive pills: Secondary | ICD-10-CM

## 2017-12-26 DIAGNOSIS — F32A Depression, unspecified: Secondary | ICD-10-CM

## 2017-12-26 DIAGNOSIS — F419 Anxiety disorder, unspecified: Secondary | ICD-10-CM

## 2017-12-26 DIAGNOSIS — E079 Disorder of thyroid, unspecified: Secondary | ICD-10-CM

## 2017-12-26 LAB — COMPREHENSIVE METABOLIC PANEL
ALT: 16 U/L (ref 0–35)
AST: 13 U/L (ref 0–37)
Albumin: 4 g/dL (ref 3.5–5.2)
Alkaline Phosphatase: 62 U/L (ref 39–117)
BUN: 10 mg/dL (ref 6–23)
CO2: 25 mEq/L (ref 19–32)
Calcium: 9.1 mg/dL (ref 8.4–10.5)
Chloride: 107 mEq/L (ref 96–112)
Creatinine, Ser: 0.46 mg/dL (ref 0.40–1.20)
GFR: 165.46 mL/min (ref >60.00)
Glucose, Bld: 84 mg/dL (ref 70–99)
Potassium: 4.1 mEq/L (ref 3.5–5.1)
Sodium: 138 mEq/L (ref 135–145)
Total Bilirubin: 0.3 mg/dL (ref 0.2–1.2)
Total Protein: 7.7 g/dL (ref 6.0–8.3)

## 2017-12-26 LAB — LIPID PANEL
Cholesterol: 105 mg/dL (ref 0–200)
HDL: 34.9 mg/dL — ABNORMAL LOW (ref >39.00)
LDL Cholesterol: 47 mg/dL (ref 0–99)
NonHDL: 70.15
Total CHOL/HDL Ratio: 3
Triglycerides: 115 mg/dL (ref 0.0–149.0)
VLDL: 23 mg/dL (ref 0.0–40.0)

## 2017-12-26 LAB — CBC
HCT: 43 % (ref 36.0–46.0)
Hemoglobin: 14.3 g/dL (ref 12.0–15.0)
MCHC: 33.3 g/dL (ref 30.0–36.0)
MCV: 98.2 fl (ref 78.0–100.0)
Platelets: 278 10*3/uL (ref 150.0–400.0)
RBC: 4.38 Mil/uL (ref 3.87–5.11)
RDW: 12.3 % (ref 11.5–15.5)
WBC: 6.4 10*3/uL (ref 4.0–10.5)

## 2017-12-26 LAB — TSH: TSH: 7.78 u[IU]/mL — ABNORMAL HIGH (ref 0.35–4.50)

## 2017-12-26 LAB — T4, FREE: Free T4: 0.62 ng/dL (ref 0.60–1.60)

## 2017-12-26 MED ORDER — LEVONORGEST-ETH ESTRAD 91-DAY 0.15-0.03 MG PO TABS: 1.0000 | ORAL_TABLET | Freq: Every day | ORAL | 3 refills | Status: DC

## 2017-12-26 MED ORDER — SERTRALINE HCL 50 MG PO TABS: 50.0000 mg | ORAL_TABLET | Freq: Every day | ORAL | 6 refills | Status: DC

## 2017-12-26 NOTE — Assessment & Plan Note (Signed)
Restart seaonal.  She was previously happy with this OCP. Refilled today.

## 2017-12-26 NOTE — Assessment & Plan Note (Signed)
Continue current dose of synthroid but I would like to repeat thyroid studies today. The patient indicates understanding of these issues and agrees with the plan. Orders Placed This Encounter  Procedures  . Flu Vaccine QUAD 6+ mos PF IM (Fluarix Quad PF)  . TSH  . T4, free  . T3  . Comprehensive metabolic panel  . CBC  . Lipid panel

## 2017-12-26 NOTE — Progress Notes (Signed)
Subjective:   Patient ID: Vanessa Adams Seen, female    DOB: Nov 01, 1985, 32 y.o.   MRN: [id number]  Vanessa Adams is a pleasant 32 y.o. year old female who presents to clinic today with Follow-up (Patient is here today to F/U with medications.  She is needing to get refills of Levothyroxine, BCP now that she is no longer breast feeding.  She would also like to have her Zoloft increased. She has been having panic attacks and overwhelming feelings of impending doom.  Agrees to get Flu shot.  )  on 12/26/2017  HPI:  Had a baby boy  in 02/2017.  Went back to work.  Daughter who is 7 years older is adjusting now - did regress initially.  Hypothyroidism- currently taking synthroid 75 mcg daily. Lab Results  Component Value Date   TSH 5.69 (H) 06/03/2015   Contraception management-  She would like to restart OCPs now that she is no longer breast feeling. Was pleased with [place].  Anxiety/depression- currently taking 25 mg daily of zoloft.  Decreased dosage during pregnancy. She has been having more panic attacks and feelings of "impending doom." Taking walks helps.  Seeing a therapist every month or two.    Current Outpatient Medications on File Prior to Visit  Medication Sig Dispense Refill  . levothyroxine (SYNTHROID, LEVOTHROID) 75 MCG tablet Take 1 tablet (75 mcg total) by mouth daily. 30 tablet 0   No current facility-administered medications on file prior to visit.     Allergies  Allergen Reactions  . Sulfa Antibiotics     stevens-johnsons   . Tuberculin Tests Hives  . Percocet [Oxycodone-Acetaminophen] Itching  . Vicodin [Hydrocodone-Acetaminophen] Itching    Past Medical History:  Diagnosis Date  . Allergy   . Anxiety   . Bell palsy    Bell's Palsy 2015  . Thyroid disease     Past Surgical History:  Procedure Laterality Date  . adenoids    . CHOLECYSTECTOMY  2007    Family History  Problem Relation Age of Onset  . Cancer Mother 13  . Hyperlipidemia  Father   . Hypertension Father   . Cancer Maternal Grandmother 45       ovarian cancer    Social History   Socioeconomic History  . Marital status: Married    Spouse name: Not on file  . Number of children: Not on file  . Years of education: Not on file  . Highest education level: Not on file  Occupational History  . Not on file  Social Needs  . Financial resource strain: Not on file  . Food insecurity:    Worry: Not on file    Inability: Not on file  . Transportation needs:    Medical: Not on file    Non-medical: Not on file  Tobacco Use  . Smoking status: Former Games developer  . Smokeless tobacco: Never Used  . Tobacco comment: quit at age 70  Substance and Sexual Activity  . Alcohol use: Yes    Comment: rare  . Drug use: No  . Sexual activity: Yes  Lifestyle  . Physical activity:    Days per week: Not on file    Minutes per session: Not on file  . Stress: Not on file  Relationships  . Social connections:    Talks on phone: Not on file    Gets together: Not on file    Attends religious service: Not on file    Active member of club or  organization: Not on file    Attends meetings of clubs or organizations: Not on file    Relationship status: Not on file  . Intimate partner violence:    Fear of current or ex partner: Not on file    Emotionally abused: Not on file    Physically abused: Not on file    Forced sexual activity: Not on file  Other Topics Concern  . Not on file  Social History Narrative  . Not on file   The PMH, PSH, Social History, Family History, Medications, and allergies have been reviewed in [facility], and have been updated if relevant.  Review of Systems  Constitutional: Negative.   HENT: Negative.   Eyes: Negative.   Respiratory: Negative.   Cardiovascular: Negative.   Gastrointestinal: Negative.   Endocrine: Negative.   Genitourinary: Negative.   Musculoskeletal: Negative.   Skin: Negative.   Neurological: Negative.   Hematological: Negative.    Psychiatric/Behavioral: Positive for decreased concentration and dysphoric mood. Negative for agitation, behavioral problems, confusion, hallucinations, self-injury, sleep disturbance and suicidal ideas. The patient is nervous/anxious. The patient is not hyperactive.   All other systems reviewed and are negative.      Objective:    BP 136/90 (BP Location: Left Arm, Patient Position: Sitting, Cuff Size: Large)   Pulse 83   Temp 98.7 F (37.1 C) (Oral)   Ht 5\' 8"  (1.727 m)   Wt 269 lb (122 kg)   LMP 12/12/2017   SpO2 97%   Breastfeeding? No   BMI 40.90 kg/m    Physical Exam  Constitutional: She is oriented to person, place, and time. She appears well-developed and well-nourished. No distress.  HENT:  Head: Normocephalic and atraumatic.  Eyes: Pupils are equal, round, and reactive to light. EOM are normal.  Neck: Normal range of motion. Neck supple. No thyromegaly present.  Cardiovascular: Normal rate and regular rhythm.  Pulmonary/Chest: Effort normal and breath sounds normal.  Musculoskeletal: Normal range of motion. She exhibits no edema.  Neurological: She is alert and oriented to person, place, and time. No cranial nerve deficit. Coordination normal.  Skin: Skin is warm and dry. She is not diaphoretic.  Psychiatric: She has a normal mood and affect. Her behavior is normal. Judgment and thought content normal.  Nursing note and vitals reviewed.         Assessment & Plan:   Thyroid disease - Plan: TSH, T4, free, T3, Comprehensive metabolic panel, CBC, Lipid panel  Need for influenza vaccination - Plan: Flu Vaccine QUAD 6+ mos PF IM (Fluarix Quad PF)  Anxiety and depression  Hypertriglyceridemia  Encounter for initial prescription of contraceptive pills No follow-ups on file.

## 2017-12-26 NOTE — Patient Instructions (Signed)
Great to see you. I will call you with your lab results from today and you can view them online.   We are increasing your zoloft to 50 mg daily.  Please keep me updated in a few weeks.

## 2017-12-26 NOTE — Assessment & Plan Note (Signed)
Deteriorated. >25 minutes spent in face to face time with patient, >50% spent in counselling or coordination of care discussing anxiety,depression, contraception and thyroid disease.  She denies any thoughts of SI or HI.  Does not feel she has post partum depression.  She feels more on edge and having more panic attacks that were previously controlled on increased dose of zoloft- increase zoloft back to 50 mg daily, continue psychotherapy. Call or return to clinic prn if these symptoms worsen or fail to improve as anticipated. The patient indicates understanding of these issues and agrees with the plan.

## 2017-12-27 ENCOUNTER — Other Ambulatory Visit: Payer: Self-pay | Admitting: Family Medicine

## 2017-12-27 LAB — T3: T3, Total: 165 ng/dL (ref 76–181)

## 2017-12-28 ENCOUNTER — Telehealth: Payer: Self-pay

## 2017-12-28 MED ORDER — LEVOTHYROXINE SODIUM 88 MCG PO TABS: 88.0000 ug | ORAL_TABLET | Freq: Every day | ORAL | 0 refills | Status: DC

## 2017-12-28 NOTE — Telephone Encounter (Signed)
PEC-Plz give pt the lab results  I called and LMOVM stating that Dr. Dayton Martes would like to increase her thyroid med and since it's the weekend I was going ahead and sending it in so that if she is in agreement she can go P/U her medication/She needs to also be scheduled for a lab visit for TSH, FT4 in 8 weeks/thx dmf

## 2017-12-28 NOTE — Telephone Encounter (Signed)
-----   Message from Dianne Dun, MD sent at 12/27/2017  2:16 PM EDT ----- Please call pt- her thyroid function is a little low.  I would suggest increasing her synthroid dose to 88 mcg daily.  If she is okay with this, please send in rx for synthroid 88 mcg daily, schedule follow up TSH, FT4 in 8 weeks.

## 2018-03-20 ENCOUNTER — Ambulatory Visit (INDEPENDENT_AMBULATORY_CARE_PROVIDER_SITE_OTHER): Payer: No Typology Code available for payment source | Admitting: Family Medicine

## 2018-03-20 ENCOUNTER — Encounter: Payer: Self-pay | Admitting: Family Medicine

## 2018-03-20 VITALS — BP 138/84 | HR 74 | Temp 98.2°F | Ht 68.0 in | Wt 274.0 lb

## 2018-03-20 DIAGNOSIS — J0101 Acute recurrent maxillary sinusitis: Secondary | ICD-10-CM

## 2018-03-20 DIAGNOSIS — F329 Major depressive disorder, single episode, unspecified: Secondary | ICD-10-CM

## 2018-03-20 DIAGNOSIS — E079 Disorder of thyroid, unspecified: Secondary | ICD-10-CM | POA: Diagnosis not present

## 2018-03-20 DIAGNOSIS — F419 Anxiety disorder, unspecified: Secondary | ICD-10-CM

## 2018-03-20 DIAGNOSIS — F32A Depression, unspecified: Secondary | ICD-10-CM

## 2018-03-20 LAB — TSH: TSH: 8.09 u[IU]/mL — ABNORMAL HIGH (ref 0.35–4.50)

## 2018-03-20 MED ORDER — FLUTICASONE PROPIONATE 50 MCG/ACT NA SUSP: 2.0000 | Freq: Every day | NASAL | 6 refills | Status: DC

## 2018-03-20 NOTE — Patient Instructions (Signed)
Good to see you today  I think you have a viral, maxillary sinusitis  For congestion- Sudafed 1-2 times per day as needed, Afrin type nasal spray twice a day for 3 days max, fluticasone nasal spray (sent to your pharmacy) nightly for 2-3 weeks  Continue sinus massage, Neti pot, warm compresses and good fluid intake  If not better in 5-7 days, please let me know

## 2018-03-20 NOTE — Progress Notes (Signed)
Subjective:    Patient ID: Vanessa Adams SeenAlexandra A Califano, female    DOB: 18-Jun-1985, 33 y.o.   MRN: [id number]  HPI This is 33 yo female, accompanied by her son, who presents today to establish care and with sinus concerns. Previous patient of Dr. Dayton MartesAron.   Panic attacks- was seen 10/19 by Dr. Dayton MartesAron. Sertraline increased from 25 to 50. Has been doing better, few panic attacks. Happy at current dose.   Hypothyroidism- TSH greater than 7 10/19, levothyroxine dose increased from 75 mcg to 88 mcg. Some increased energy.   Sinus drainage and congestion x 3 days.  A lot of facial pressure. Nasal drainage thick, teeth achy. Little relief with Neti Pot, steam. Has been taking ibuprofen 800 mg twice a day. Good fluid intake, no fever. Son with runny nose.   Past Medical History:  Diagnosis Date  . Allergy   . Anxiety   . Bell palsy    Bell's Palsy 2015  . Thyroid disease    Past Surgical History:  Procedure Laterality Date  . adenoids    . CHOLECYSTECTOMY  2007   Family History  Problem Relation Age of Onset  . Cancer Mother 6545  . Hyperlipidemia Father   . Hypertension Father   . Cancer Maternal Grandmother 45       ovarian cancer   Social History   Tobacco Use  . Smoking status: Former Games developermoker  . Smokeless tobacco: Never Used  . Tobacco comment: quit at age 33  Substance Use Topics  . Alcohol use: Yes    Comment: rare  . Drug use: No      Review of Systems Per HPI    Objective:   Physical Exam Vitals signs reviewed.  Constitutional:      Appearance: Normal appearance. She is obese.  HENT:     Head: Normocephalic and atraumatic.     Right Ear: Tympanic membrane, ear canal and external ear normal.     Left Ear: Tympanic membrane, ear canal and external ear normal.     Nose: Congestion present.     Mouth/Throat:     Pharynx: Oropharynx is clear.  Eyes:     Conjunctiva/sclera: Conjunctivae normal.  Neck:     Musculoskeletal: Normal range of motion and neck supple.    Cardiovascular:     Rate and Rhythm: Normal rate and regular rhythm.     Heart sounds: Normal heart sounds.  Pulmonary:     Effort: Pulmonary effort is normal.     Breath sounds: Normal breath sounds.  Lymphadenopathy:     Cervical: No cervical adenopathy.  Skin:    General: Skin is warm and dry.  Neurological:     General: No focal deficit present.     Mental Status: She is alert and oriented to person, place, and time.  Psychiatric:        Mood and Affect: Mood normal.        Behavior: Behavior normal.        Thought Content: Thought content normal.        Judgment: Judgment normal.       BP 138/84   Pulse 74   Temp 98.2 F (36.8 C) (Oral)   Ht 5\' 8"  (1.727 m)   Wt 274 lb (124.3 kg)   LMP 03/09/2018   SpO2 97%   BMI 41.66 kg/m  Wt Readings from Last 3 Encounters:  03/20/18 274 lb (124.3 kg)  12/26/17 269 lb (122 kg)  03/03/17 260 lb (117.9  kg)   GAD 7 : Generalized Anxiety Score 03/20/2018 12/26/2017  Nervous, Anxious, on Edge 1 2  Control/stop worrying 0 2  Worry too much - different things 1 2  Trouble relaxing 0 2  Restless 1 0  Easily annoyed or irritable 1 3  Afraid - awful might happen 1 3  Total GAD 7 Score 5 14  Anxiety Difficulty - Very difficult        Assessment & Plan:  1. Acute recurrent maxillary sinusitis - Provided written and verbal information regarding diagnosis and treatment. -  Patient Instructions  Good to see you today  I think you have a viral, maxillary sinusitis  For congestion- Sudafed 1-2 times per day as needed, Afrin type nasal spray twice a day for 3 days max, fluticasone nasal spray (sent to your pharmacy) nightly for 2-3 weeks  Continue sinus massage, Neti pot, warm compresses and good fluid intake  If not better in 5-7 days, please let me know   - fluticasone (FLONASE) 50 MCG/ACT nasal spray; Place 2 sprays into both nostrils daily.  Dispense: 16 g; Refill: 6  2. Thyroid disease - TSH  3. Anxiety and  depression - Improved on sertraline 50 mg, continue current dose   Olean Reeeborah Tanaia Hawkey, FNP-BC  [facility] [place], [facility]  03/20/2018 1:20 PM

## 2018-03-25 MED ORDER — LEVOTHYROXINE SODIUM 100 MCG PO TABS: 100.0000 ug | ORAL_TABLET | Freq: Every day | ORAL | 3 refills | Status: DC

## 2018-03-25 NOTE — Addendum Note (Signed)
Addended by: Olean Ree B on: 03/25/2018 09:45 AM   Modules accepted: Orders

## 2018-08-13 ENCOUNTER — Ambulatory Visit (INDEPENDENT_AMBULATORY_CARE_PROVIDER_SITE_OTHER): Payer: No Typology Code available for payment source | Admitting: Psychology

## 2018-08-13 DIAGNOSIS — F4323 Adjustment disorder with mixed anxiety and depressed mood: Secondary | ICD-10-CM | POA: Diagnosis not present

## 2018-08-30 ENCOUNTER — Ambulatory Visit (INDEPENDENT_AMBULATORY_CARE_PROVIDER_SITE_OTHER): Payer: No Typology Code available for payment source | Admitting: Psychology

## 2018-08-30 DIAGNOSIS — F4322 Adjustment disorder with anxiety: Secondary | ICD-10-CM

## 2018-08-30 DIAGNOSIS — Z7189 Other specified counseling: Secondary | ICD-10-CM | POA: Diagnosis not present

## 2018-10-01 ENCOUNTER — Ambulatory Visit (INDEPENDENT_AMBULATORY_CARE_PROVIDER_SITE_OTHER): Payer: No Typology Code available for payment source | Admitting: Family Medicine

## 2018-10-01 ENCOUNTER — Encounter: Payer: Self-pay | Admitting: Family Medicine

## 2018-10-01 ENCOUNTER — Other Ambulatory Visit: Payer: Self-pay

## 2018-10-01 VITALS — BP 134/82 | HR 99 | Temp 98.0°F | Ht 68.0 in | Wt 283.1 lb

## 2018-10-01 DIAGNOSIS — E039 Hypothyroidism, unspecified: Secondary | ICD-10-CM

## 2018-10-01 DIAGNOSIS — E079 Disorder of thyroid, unspecified: Secondary | ICD-10-CM

## 2018-10-01 LAB — TSH: TSH: 3.57 u[IU]/mL (ref 0.35–4.50)

## 2018-10-01 LAB — T4, FREE: Free T4: 0.72 ng/dL (ref 0.60–1.60)

## 2018-10-01 NOTE — Patient Instructions (Addendum)
Go to the lab on the way out.  We'll contact you with your lab report. We make arrangements for referrals, extra imaging, and other appointments based on the urgency of the situation. We work diligently to process all referrals as quickly as possible.   Take care.  Glad to see you.

## 2018-10-01 NOTE — Progress Notes (Signed)
Scientist, clinical (histocompatibility and immunogenetics).  Pandemic considerations d/w pt.  Mood is followed by psych.  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  No SI/HI.    She felt a fullness in her neck, noted with swallowing.  Voice sounds a little "gravelly."  H/o elevated TSH, due for f/u labs.  She is fatigued.  No FCNAVD.  She is taking levothyroxine on empty stomach at baseline, compliant.    She doesn't have heavy or frequency periods with current meds.   Meds, vitals, and allergies reviewed.   ROS: Per HPI unless specifically indicated in ROS section   nad ncat Neck supple, no LA.  No specific thyroid nodule but does feel slightly full bilaterally.   rrr ctab

## 2018-10-02 NOTE — Assessment & Plan Note (Signed)
Check labs and set up ultrasound.  See notes on labs and imaging.  Routed to PCP as FYI.  Okay for outpatient follow-up.  Patient agrees with plan.  No change in meds at this point.

## 2018-10-08 ENCOUNTER — Ambulatory Visit
Admission: RE | Admit: 2018-10-08 | Discharge: 2018-10-08 | Disposition: A | Discharge status: Home / Self Care | Payer: No Typology Code available for payment source | Source: Ambulatory Visit | Attending: Family Medicine | Admitting: Family Medicine

## 2018-10-08 ENCOUNTER — Other Ambulatory Visit: Payer: Self-pay

## 2018-10-08 DIAGNOSIS — E039 Hypothyroidism, unspecified: Secondary | ICD-10-CM | POA: Insufficient documentation

## 2018-10-10 ENCOUNTER — Other Ambulatory Visit: Payer: Self-pay | Admitting: Family Medicine

## 2018-10-10 ENCOUNTER — Telehealth: Payer: Self-pay | Admitting: Family Medicine

## 2018-10-10 DIAGNOSIS — E041 Nontoxic single thyroid nodule: Secondary | ICD-10-CM

## 2018-10-10 NOTE — Telephone Encounter (Signed)
Spoke to pt

## 2018-10-10 NOTE — Telephone Encounter (Signed)
See imaging lab.

## 2018-10-10 NOTE — Telephone Encounter (Signed)
Pt called regarding 7/28 [place] and is requesting a cb as soon as resulted.

## 2018-10-10 NOTE — Telephone Encounter (Signed)
Patient called back about [place] she had done, She is very concerned that she has not heard from our office but is receiving notifications from other office that a referral has been sent to them   She is requesting a call back asap   Patient became very upset that she has not heard from our office about this when the imaging center told her the results would be here today

## 2018-10-10 NOTE — Telephone Encounter (Signed)
See result note when addressed.

## 2018-10-15 ENCOUNTER — Other Ambulatory Visit: Payer: Self-pay

## 2018-10-15 ENCOUNTER — Ambulatory Visit: Payer: No Typology Code available for payment source | Admitting: General Surgery

## 2018-10-15 ENCOUNTER — Encounter: Payer: Self-pay | Admitting: General Surgery

## 2018-10-15 ENCOUNTER — Ambulatory Visit (INDEPENDENT_AMBULATORY_CARE_PROVIDER_SITE_OTHER): Payer: No Typology Code available for payment source

## 2018-10-15 VITALS — BP 140/90 | HR 71 | Temp 97.9°F | Ht 69.0 in | Wt 282.0 lb

## 2018-10-15 DIAGNOSIS — E041 Nontoxic single thyroid nodule: Secondary | ICD-10-CM

## 2018-10-15 NOTE — Patient Instructions (Signed)
We will call you with results.  If normal we will see you back in one year with a thyroid ultrasound

## 2018-10-16 NOTE — Progress Notes (Signed)
Patient ID: Vanessa Adams, female   DOB: 1985/10/05, 33 y.o.   MRN: [id number]  Chief Complaint  Patient presents with  . Thyroid Nodule    HPI Vanessa Adams is a 33 y.o. female.  She has been referred by her primary care providers, Dr. Elsie Stain, and Nolon Bussing, FNP, for further evaluation of a thyroid nodule.  She has a history of hypothyroidism, NOS, currently taking levothyroxine.  On a routine clinic visit, she stated that she felt like her neck was fuller than usual.  This resulted in a thyroid ultrasound that demonstrated a right-sided thyroid nodule that was isoechoic to the surrounding parenchyma and taller than wide.  It was solid and measured 1.8 cm in greatest dimension.  Therefore met criteria for biopsy.  She has been referred for this entity.  Ms. Kath does endorse some dysphagia or foreign body sensation that does not seem to be better or worse with solids versus liquids.  She says that she has a heavy sensation in her neck but denies difficulty breathing in the supine position.  She states that her voice has become more gravelly and endorses frequent throat clearing.  She complains of brain fog.  She endorses heat intolerance as well as dry, brittle hair, skin, and nails.  She endorses diarrhea, but states that this is been present since she had her cholecystectomy years ago.  She does have anxiety but otherwise denies heart palpitations, hand tremors, or other sensation of hyperactivity.  She does not have any history of therapeutic radiation or occupational exposure to ionizing radiation.  She reports that her paternal grandmother was hypothyroid.  Again, the etiology of the hypothyroidism is not known.   Past Medical History:  Diagnosis Date  . Allergy   . Anxiety   . Bell palsy    Bell's Palsy 2015  . History of Bell's palsy    affected left side  . Thyroid disease    hypothyroidism    Past Surgical History:  Procedure Laterality Date  . adenoids     . CHOLECYSTECTOMY  2007  . TONSILECTOMY/ADENOIDECTOMY WITH MYRINGOTOMY      Family History  Problem Relation Age of Onset  . Ovarian cysts Mother        has marker for ovarian cancer  . Hyperlipidemia Father   . Hypertension Father   . Cancer Maternal Grandmother 45       ovarian cancer  . Hypothyroidism Paternal Grandmother     Social History Social History   Tobacco Use  . Smoking status: Former Research scientist (life sciences)  . Smokeless tobacco: Never Used  . Tobacco comment: quit at age 13  Substance Use Topics  . Alcohol use: Yes    Comment: rare  . Drug use: No    Allergies  Allergen Reactions  . Sulfa Antibiotics Rash    stevens-johnsons   . Tuberculin Tests Hives  . Percocet [Oxycodone-Acetaminophen] Itching  . Vicodin [Hydrocodone-Acetaminophen] Itching    Current Outpatient Medications  Medication Sig Dispense Refill  . fluticasone (FLONASE) 50 MCG/ACT nasal spray Place 2 sprays into both nostrils daily. 16 g 6  . levonorgestrel-ethinyl estradiol (SEASONALE,INTROVALE,JOLESSA) 0.15-0.03 MG tablet Take 1 tablet by mouth daily. 1 Package 3  . levothyroxine (SYNTHROID, LEVOTHROID) 100 MCG tablet Take 1 tablet (100 mcg total) by mouth daily. 30 tablet 3  . sertraline (ZOLOFT) 50 MG tablet Take 1 tablet (50 mg total) by mouth daily. 30 tablet 6   No current facility-administered medications for this visit.  Review of Systems Review of Systems  All other systems reviewed and are negative. Are as discussed in the history of present illness  Blood pressure 140/90, pulse 71, temperature 97.9 F (36.6 C), height 5' 9"  (1.753 m), weight 282 lb (127.9 kg), last menstrual period 10/08/2018, SpO2 98 %, not currently breastfeeding.  Physical Exam Physical Exam Constitutional:      General: She is not in acute distress.    Appearance: She is obese.  HENT:     Head: Normocephalic and atraumatic.     Nose:     Comments: Covered with a mask secondary to COVID-19 precautions     Mouth/Throat:     Comments: Covered with a mask secondary to COVID-19 precautions Eyes:     General: No scleral icterus.       Right eye: No discharge.        Left eye: No discharge.     Comments: No proptosis, exophthalmos, or periorbital edema noted.  No lid lag or stare.  Neck:     Musculoskeletal: Normal range of motion.     Comments: No dominant masses are appreciated within the thyroid gland.  The gland is slightly larger than normal.  It moves freely with deglutition.  No thyroid bruit identified. Cardiovascular:     Rate and Rhythm: Normal rate and regular rhythm.     Pulses: Normal pulses.     Heart sounds: Normal heart sounds.  Pulmonary:     Effort: Pulmonary effort is normal.     Breath sounds: Normal breath sounds.  Abdominal:     General: Bowel sounds are normal. There is no distension.     Palpations: Abdomen is soft.     Comments: Protuberant consistent with her level of obesity.  Genitourinary:    Comments: Deferred Musculoskeletal:        General: No swelling or deformity.     Right lower leg: No edema.     Left lower leg: No edema.  Lymphadenopathy:     Cervical: No cervical adenopathy.  Skin:    General: Skin is warm and dry.     Findings: No bruising or rash.  Neurological:     General: No focal deficit present.     Mental Status: She is alert.  Psychiatric:        Mood and Affect: Mood normal.     Data Reviewed Results for Vanessa, Adams (MRN [id number]) as of 10/16/2018 10:55  Ref. Range 12/26/2017 12:13 03/20/2018 11:58 10/01/2018 10:38  TSH Latest Ref Range: 0.35 - 4.50 uIU/mL 7.78 (H) 8.09 (H) 3.57  Triiodothyronine (T3) Latest Ref Range: 76 - 181 ng/dL 165    T4,Free(Direct) Latest Ref Range: 0.60 - 1.60 ng/dL 0.62  0.72   Thyroid function testing was reviewed.  She is currently euthyroid with a TSH of 3.57 and a free T4 of 0.72.  I do not see any record of antibody testing performed.  I personally reviewed the patient's thyroid ultrasound  imaging.  The radiologist interpretation is copied below.  I am in agreement with their findings.  CLINICAL DATA:  Hypothyroidism  EXAM: THYROID ULTRASOUND  TECHNIQUE: Ultrasound examination of the thyroid gland and adjacent soft tissues was performed.  COMPARISON:  None.  FINDINGS: Parenchymal Echotexture: Moderately heterogeneous  Isthmus: 2 mm  Right lobe: 4.6 x 2.1 x 1.6 cm  Left lobe: 4.2 x 1.6 x 1.4 cm  _________________________________________________________  Estimated total number of nodules >/= 1 cm: 1  Number of spongiform nodules >/=  2 cm not described below (TR1): 0  Number of mixed cystic and solid nodules >/= 1.5 cm not described below (Orange): 0  _________________________________________________________  Nodule # 1:  Location: Right; Mid  Maximum size: 1.8 cm; Other 2 dimensions: 1.5 x 1.2 cm  Composition: solid/almost completely solid (2)  Echogenicity: isoechoic (1)  Shape: taller-than-wide (3)  Margins: smooth (0)  Echogenic foci: none (0)  ACR TI-RADS total points: 6.  ACR TI-RADS risk category: TR4 (4-6 points).  ACR TI-RADS recommendations:  **Given size (>/= 1.5 cm) and appearance, fine needle aspiration of this moderately suspicious nodule should be considered based on TI-RADS criteria.  _________________________________________________________  No hypervascularity.  No regional adenopathy.  IMPRESSION: 1.8 cm right mid thyroid TR 4 nodule meets criteria biopsy as above.  Nonspecific moderate thyroid heterogeneity  The above is in keeping with the ACR TI-RADS recommendations - J Am Coll Radiol 2017;14:587-595.  Assessment This is a 33 year old woman who is hypothyroid.  The most common etiology of hypothyroidism aside from surgical excision, is Hashimoto's thyroiditis.  The patient's complaints of brain fog may relate to elevated thyroid peroxidase antibodies.  She may also require additional  levothyroxine.  Goal TSH should be between 1-2.5, although normal range for many labs is higher than this.  Patients do seem to feel better when they are TSH is within the above-stated range.  Hashimoto's thyroiditis also carries with it a slight increased risk of papillary thyroid carcinoma.  Based upon the imaging performed she does meet criteria for biopsy.  This was performed in clinic today.  Please see separate documentation for details.  Plan I anticipate having the fine-needle aspiration biopsy results back within a week or so.  Should additional molecular diagnostic testing be required, this can take up to a day additional 2 weeks.  I will notify the patient with the findings.  If no surgical intervention is warranted, she should return to my clinic in 1 years time for surveillance ultrasound.  If the findings suggest that surgery is indicated, I will discuss that with her when I relayed the results.    Fredirick Maudlin 10/16/2018, 10:48 AM

## 2018-10-18 ENCOUNTER — Telehealth: Payer: Self-pay | Admitting: Family Medicine

## 2018-10-18 NOTE — Telephone Encounter (Signed)
Patient is requesting to have labs done for Hashimoto's She stated she recently seen an Endocrinologist and they recommended her to call our office to see if this could be done     C/B # [phone]

## 2018-10-21 ENCOUNTER — Other Ambulatory Visit: Payer: Self-pay | Admitting: Family Medicine

## 2018-10-21 DIAGNOSIS — E039 Hypothyroidism, unspecified: Secondary | ICD-10-CM

## 2018-10-21 NOTE — Telephone Encounter (Signed)
Please call patient and let her know that I have put in an order for thyroid antibody testing. Please schedule her a lab only visit.

## 2018-10-24 ENCOUNTER — Other Ambulatory Visit (INDEPENDENT_AMBULATORY_CARE_PROVIDER_SITE_OTHER): Payer: No Typology Code available for payment source

## 2018-10-24 ENCOUNTER — Telehealth: Payer: Self-pay | Admitting: General Surgery

## 2018-10-24 ENCOUNTER — Other Ambulatory Visit: Payer: Self-pay

## 2018-10-24 ENCOUNTER — Telehealth: Payer: Self-pay | Admitting: *Deleted

## 2018-10-24 DIAGNOSIS — E039 Hypothyroidism, unspecified: Secondary | ICD-10-CM | POA: Diagnosis not present

## 2018-10-24 NOTE — Telephone Encounter (Signed)
Pt scheduled for lab visit today at 1pm  Neg covid screen Nothing further needed.

## 2018-10-24 NOTE — Telephone Encounter (Signed)
Patient called and is wanting to know results from her US thyroid she had done on 10/16/18

## 2018-10-24 NOTE — Telephone Encounter (Signed)
Patient is aware that Dr Celine Ahr is in clinic and she will call her with results as soon as she is available per Dr Celine Ahr, patient agrees.

## 2018-10-24 NOTE — Telephone Encounter (Signed)
I contacted Vanessa Adams and let her know that her thyroid fine-needle aspiration biopsy showed atypia of undetermined significance.  She and I had discussed this possibility while in clinic.  The sample will be sent for thyroseq molecular diagnostic testing.  I will contact her once I have those results available.  She understands that this can take 2 weeks or more.

## 2018-10-25 LAB — THYROID ANTIBODIES
Thyroglobulin Ab: 938 IU/mL — ABNORMAL HIGH (ref <=1)
Thyroperoxidase Ab SerPl-aCnc: 367 IU/mL — ABNORMAL HIGH (ref <9)

## 2018-12-13 ENCOUNTER — Other Ambulatory Visit: Payer: Self-pay | Admitting: Family Medicine

## 2019-02-13 ENCOUNTER — Other Ambulatory Visit: Payer: Self-pay | Admitting: Family Medicine

## 2019-02-21 ENCOUNTER — Telehealth: Payer: Self-pay | Admitting: Family Medicine

## 2019-02-21 DIAGNOSIS — Z3041 Encounter for surveillance of contraceptive pills: Secondary | ICD-10-CM

## 2019-02-21 NOTE — Telephone Encounter (Signed)
Patient is due for follow up/CPE with Debbie in January 2020, will refill for 3 month supply until she can be seen. Pap smear completed in 2018, negative.  Please schedule follow up visit.

## 2019-02-24 NOTE — Telephone Encounter (Signed)
Left message asking pt to call office  °

## 2019-03-05 NOTE — Telephone Encounter (Signed)
LABS 1/21 CPX 1/25 PT AWARE

## 2019-03-31 ENCOUNTER — Other Ambulatory Visit: Payer: Self-pay | Admitting: Family Medicine

## 2019-03-31 DIAGNOSIS — E079 Disorder of thyroid, unspecified: Secondary | ICD-10-CM

## 2019-03-31 DIAGNOSIS — E039 Hypothyroidism, unspecified: Secondary | ICD-10-CM

## 2019-03-31 DIAGNOSIS — F329 Major depressive disorder, single episode, unspecified: Secondary | ICD-10-CM

## 2019-03-31 DIAGNOSIS — F419 Anxiety disorder, unspecified: Secondary | ICD-10-CM

## 2019-03-31 DIAGNOSIS — F32A Depression, unspecified: Secondary | ICD-10-CM

## 2019-03-31 DIAGNOSIS — Z6841 Body Mass Index (BMI) 40.0 and over, adult: Secondary | ICD-10-CM

## 2019-04-01 ENCOUNTER — Telehealth: Payer: Self-pay

## 2019-04-01 NOTE — Telephone Encounter (Signed)
LVM w COVID screen, front door and back lab info 1.19.2021 TLJ

## 2019-04-03 ENCOUNTER — Other Ambulatory Visit: Payer: No Typology Code available for payment source

## 2019-04-07 ENCOUNTER — Encounter: Payer: No Typology Code available for payment source | Admitting: Family Medicine

## 2019-04-23 ENCOUNTER — Telehealth: Payer: Self-pay

## 2019-04-23 NOTE — Telephone Encounter (Signed)
LVM w COVID screen, front door and back lab info 2.10.2021 TLJ 

## 2019-04-25 ENCOUNTER — Other Ambulatory Visit: Payer: Self-pay

## 2019-04-25 ENCOUNTER — Other Ambulatory Visit: Payer: No Typology Code available for payment source

## 2019-04-28 ENCOUNTER — Encounter: Payer: No Typology Code available for payment source | Admitting: Family Medicine

## 2019-04-28 DIAGNOSIS — Z0289 Encounter for other administrative examinations: Secondary | ICD-10-CM

## 2019-05-21 ENCOUNTER — Ambulatory Visit: Payer: No Typology Code available for payment source | Admitting: Family Medicine

## 2019-05-22 ENCOUNTER — Other Ambulatory Visit (INDEPENDENT_AMBULATORY_CARE_PROVIDER_SITE_OTHER): Payer: No Typology Code available for payment source

## 2019-05-22 ENCOUNTER — Other Ambulatory Visit: Payer: Self-pay

## 2019-05-22 DIAGNOSIS — F32A Depression, unspecified: Secondary | ICD-10-CM

## 2019-05-22 DIAGNOSIS — E039 Hypothyroidism, unspecified: Secondary | ICD-10-CM | POA: Diagnosis not present

## 2019-05-22 DIAGNOSIS — F419 Anxiety disorder, unspecified: Secondary | ICD-10-CM

## 2019-05-22 DIAGNOSIS — F329 Major depressive disorder, single episode, unspecified: Secondary | ICD-10-CM | POA: Diagnosis not present

## 2019-05-22 DIAGNOSIS — E079 Disorder of thyroid, unspecified: Secondary | ICD-10-CM | POA: Diagnosis not present

## 2019-05-22 DIAGNOSIS — Z6841 Body Mass Index (BMI) 40.0 and over, adult: Secondary | ICD-10-CM

## 2019-05-22 DIAGNOSIS — E559 Vitamin D deficiency, unspecified: Secondary | ICD-10-CM

## 2019-05-22 LAB — LIPID PANEL
Cholesterol: 123 mg/dL (ref 0–200)
HDL: 34.7 mg/dL — ABNORMAL LOW (ref >39.00)
LDL Cholesterol: 55 mg/dL (ref 0–99)
NonHDL: 88.25
Total CHOL/HDL Ratio: 4
Triglycerides: 167 mg/dL — ABNORMAL HIGH (ref 0.0–149.0)
VLDL: 33.4 mg/dL (ref 0.0–40.0)

## 2019-05-22 LAB — TSH: TSH: 6.76 u[IU]/mL — ABNORMAL HIGH (ref 0.35–4.50)

## 2019-05-22 LAB — VITAMIN D 25 HYDROXY (VIT D DEFICIENCY, FRACTURES): VITD: 18.11 ng/mL — ABNORMAL LOW (ref 30.00–100.00)

## 2019-05-23 ENCOUNTER — Ambulatory Visit: Payer: No Typology Code available for payment source | Attending: Internal Medicine

## 2019-05-23 ENCOUNTER — Other Ambulatory Visit: Payer: Self-pay | Admitting: Family Medicine

## 2019-05-23 DIAGNOSIS — Z23 Encounter for immunization: Secondary | ICD-10-CM

## 2019-05-23 MED ORDER — VITAMIN D3 1.25 MG (50000 UT) PO TABS: 1.0000 | ORAL_TABLET | ORAL | 1 refills | Status: DC

## 2019-05-23 MED ORDER — LEVOTHYROXINE SODIUM 112 MCG PO TABS: 112.0000 ug | ORAL_TABLET | Freq: Every day | ORAL | 3 refills | Status: DC

## 2019-05-23 NOTE — Addendum Note (Signed)
Addended by: Olean Ree B on: 05/23/2019 05:48 AM   Modules accepted: Orders

## 2019-05-23 NOTE — Progress Notes (Signed)
   Covid-19 Vaccination Clinic  Name:  TARA RUD    MRN: [id number] DOB: 03/27/1985  05/23/2019  Ms. Behrendt was observed post Covid-19 immunization for 15 minutes without incident. She was provided with Vaccine Information Sheet and instruction to access the V-Safe system.   Ms. Endres was instructed to call 911 with any severe reactions post vaccine: Marland Kitchen Difficulty breathing  . Swelling of face and throat  . A fast heartbeat  . A bad rash all over body  . Dizziness and weakness   Immunizations Administered    Name Date Dose VIS Date Route   Pfizer COVID-19 Vaccine 05/23/2019 11:13 AM 0.3 mL 02/21/2019 Intramuscular   Manufacturer: [employer], [employer]   Lot: [id number]   NDC: [address]-2

## 2019-05-28 ENCOUNTER — Ambulatory Visit (INDEPENDENT_AMBULATORY_CARE_PROVIDER_SITE_OTHER): Payer: No Typology Code available for payment source | Admitting: Family Medicine

## 2019-05-28 ENCOUNTER — Other Ambulatory Visit: Payer: Self-pay | Admitting: Family Medicine

## 2019-05-28 ENCOUNTER — Other Ambulatory Visit: Payer: Self-pay

## 2019-05-28 ENCOUNTER — Encounter: Payer: Self-pay | Admitting: Family Medicine

## 2019-05-28 VITALS — BP 142/80 | HR 80 | Temp 97.8°F | Ht 69.0 in | Wt 286.8 lb

## 2019-05-28 DIAGNOSIS — F419 Anxiety disorder, unspecified: Secondary | ICD-10-CM | POA: Diagnosis not present

## 2019-05-28 DIAGNOSIS — F329 Major depressive disorder, single episode, unspecified: Secondary | ICD-10-CM | POA: Diagnosis not present

## 2019-05-28 DIAGNOSIS — E559 Vitamin D deficiency, unspecified: Secondary | ICD-10-CM | POA: Diagnosis not present

## 2019-05-28 DIAGNOSIS — E079 Disorder of thyroid, unspecified: Secondary | ICD-10-CM

## 2019-05-28 DIAGNOSIS — F32A Depression, unspecified: Secondary | ICD-10-CM

## 2019-05-28 MED ORDER — SERTRALINE HCL 100 MG PO TABS: 100.0000 mg | ORAL_TABLET | Freq: Every day | ORAL | 3 refills | Status: DC

## 2019-05-28 MED ORDER — HYDROXYZINE HCL 25 MG PO TABS: 12.5000 mg | ORAL_TABLET | Freq: Three times a day (TID) | ORAL | 0 refills | Status: DC | PRN

## 2019-05-28 NOTE — Progress Notes (Signed)
Subjective:    Patient ID: Vanessa Adams, female    DOB: 24-Sep-1985, 34 y.o.   MRN: [id number]  HPI Chief Complaint  Patient presents with  . Follow-up    Pt states that she has not had a sinus infection or ear infection recently so she feels the Aleda Grana is working some. Pt also feels that the Sertraline 50mg  is not working - pt reports having a severe depression spell which lasted a couple months - started after her last visit here. Pt states that she shut down and would not leave her house. Increased stress. Pt has been talking with therapist.    Having more frequent episodes of "impending doom." Currently dealing with difficulty with 3rd grade daughter doing remote learning. Sleeping "too much." 8 hours at night, 2 hour naps some day. Energy better when she avoids gluten. Husband supportive. Enjoys playing [employer]. Some walking in evenings.   Meeting with Dr. , was having episodes of shaking, shivering and vomiting 2x/ week. Less frequent episodes now. No episodes since Jan.  She requests a short acting medication for these attacks.  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  Allergic rhinitis-has been doing well on fluticasone nasal spray without having one of her typical sinus infections.  She had labs done earlier this month.  Elevated TSH and low vitamin D.  I have already sent discussion with patient via MyChart.  Review of Systems Per HPI    Objective:   Physical Exam Vitals reviewed.  Constitutional:      Appearance: Normal appearance. She is obese.  HENT:     Head: Normocephalic and atraumatic.  Cardiovascular:     Rate and Rhythm: Normal rate.  Pulmonary:     Effort: Pulmonary effort is normal.  Neurological:     Mental Status: She is alert and oriented to person, place, and time.  Psychiatric:        Mood and Affect: Mood normal.        Behavior: Behavior normal.        Thought Content:  Thought content normal.        Judgment: Judgment normal.      BP (!) 142/80 (BP Location: Left Arm, Patient Position: Sitting, Cuff Size: Large)   Pulse 80   Temp 97.8 F (36.6 C) (Temporal)   Ht 5\' 9"  (1.753 m)   Wt 286 lb 12.8 oz (130.1 kg)   SpO2 96%   BMI 42.35 kg/m  Wt Readings from Last 3 Encounters:  05/28/19 286 lb 12.8 oz (130.1 kg)  10/15/18 282 lb (127.9 kg)  10/01/18 283 lb 2 oz (128.4 kg)   Depression screen [facility] 2/9 05/28/2019 12/26/2017  Decreased Interest 1 1  Down, Depressed, Hopeless 1 2  PHQ - 2 Score 2 3  Altered sleeping 3 3  Tired, decreased energy 3 3  Change in appetite 1 3  Feeling bad or failure about yourself  3 1  Trouble concentrating 2 0  Moving slowly or fidgety/restless 1 0  Suicidal thoughts 0 0  PHQ-9 Score 15 13  Difficult doing work/chores Somewhat difficult Very difficult   GAD 7 : Generalized Anxiety Score 05/28/2019 03/20/2018 12/26/2017  Nervous, Anxious, on Edge 2 1 2   Control/stop worrying 2 0 2  Worry too much - different things 2 1 2   Trouble relaxing 2 0 2  Restless 1 1 0  Easily annoyed or irritable 2 1 3   Afraid -  awful might happen 2 1 3   Total GAD 7 Score 13 5 14   Anxiety Difficulty - - Very difficult         Assessment & Plan:  1. Anxiety and depression -She is at suboptimal dose of sertraline, will use two 1-1/2 tablets x 1 week then 2 tablets.  New prescription sent in for patient.  Will provide hydroxyzine for as needed panic attacks. -Encouraged her to continue counseling - hydrOXYzine (ATARAX/VISTARIL) 25 MG tablet; Take 0.5-1 tablets (12.5-25 mg total) by mouth every 8 (eight) hours as needed for anxiety.  Dispense: 30 tablet; Refill: 0 - sertraline (ZOLOFT) 100 MG tablet; Take 1 tablet (100 mg total) by mouth daily.  Dispense: 90 tablet; Refill: 3 -I have asked her to notify me as to how she is doing in 4 weeks and follow-up in the office in 12 weeks  2. Thyroid disease -Previously increased dose of  levothyroxine was sent to patient's pharmacy -Recheck in 6 months  3. Vitamin D deficiency -Recommendations given to patient regarding supplementation  Over 30 minutes were spent face-to-face with the patient during this encounter and >50% of that time was spent on counseling and coordination of care   This visit occurred during the SARS-CoV-2 public health emergency.  Safety protocols were in place, including screening questions prior to the visit, additional usage of staff PPE, and extensive cleaning of exam room while observing appropriate contact time as indicated for disinfecting solutions.    Clarene Reamer, FNP-BC  [facility] at [facility], [facility]  05/29/2019 3:01 PM

## 2019-05-28 NOTE — Patient Instructions (Addendum)
Increase sertraline to 1 and 1/2 tablets for 2 weeks then to 2 tablets  Send me a message in 4 weeks to let me know how you are doing  Follow up in 12 weeks

## 2019-05-29 ENCOUNTER — Encounter: Payer: Self-pay | Admitting: Family Medicine

## 2019-06-17 ENCOUNTER — Ambulatory Visit: Payer: No Typology Code available for payment source | Attending: Internal Medicine

## 2019-06-17 DIAGNOSIS — Z23 Encounter for immunization: Secondary | ICD-10-CM

## 2019-06-17 NOTE — Progress Notes (Signed)
   Covid-19 Vaccination Clinic  Name:  Vanessa Adams    MRN: [id number] DOB: 1985/06/15  06/17/2019  [name] was observed post Covid-19 immunization for 15 minutes without incident. She was provided with Vaccine Information Sheet and instruction to access the V-Safe system.   [name] was instructed to call 911 with any severe reactions post vaccine: Marland Kitchen Difficulty breathing  . Swelling of face and throat  . A fast heartbeat  . A bad rash all over body  . Dizziness and weakness   Immunizations Administered    Name Date Dose VIS Date Route   Pfizer COVID-19 Vaccine 06/17/2019 11:08 AM 0.3 mL 02/21/2019 Intramuscular   Manufacturer: [employer], [employer]   Lot: [id number]   NDC: [address]-5

## 2019-08-19 ENCOUNTER — Other Ambulatory Visit: Payer: Self-pay | Admitting: Family Medicine

## 2019-08-19 ENCOUNTER — Other Ambulatory Visit: Payer: Self-pay | Admitting: Primary Care

## 2019-08-19 DIAGNOSIS — Z3041 Encounter for surveillance of contraceptive pills: Secondary | ICD-10-CM

## 2019-08-19 NOTE — Telephone Encounter (Signed)
Last prescribed on 02/21/2019 by Mayra Reel. Last OV on 05/28/2019. No future OV scheduled

## 2019-10-16 ENCOUNTER — Ambulatory Visit: Payer: No Typology Code available for payment source | Admitting: General Surgery

## 2019-12-25 ENCOUNTER — Encounter: Payer: Self-pay | Admitting: *Deleted

## 2020-01-16 ENCOUNTER — Ambulatory Visit: Payer: No Typology Code available for payment source | Admitting: Family Medicine

## 2020-02-04 ENCOUNTER — Encounter: Payer: Self-pay | Admitting: Family Medicine

## 2020-02-04 ENCOUNTER — Ambulatory Visit (INDEPENDENT_AMBULATORY_CARE_PROVIDER_SITE_OTHER): Payer: No Typology Code available for payment source | Admitting: Family Medicine

## 2020-02-04 ENCOUNTER — Other Ambulatory Visit: Payer: Self-pay

## 2020-02-04 VITALS — BP 122/76 | HR 99 | Temp 97.3°F | Ht 69.0 in | Wt 273.0 lb

## 2020-02-04 DIAGNOSIS — Z6841 Body Mass Index (BMI) 40.0 and over, adult: Secondary | ICD-10-CM

## 2020-02-04 DIAGNOSIS — E039 Hypothyroidism, unspecified: Secondary | ICD-10-CM | POA: Diagnosis not present

## 2020-02-04 DIAGNOSIS — E559 Vitamin D deficiency, unspecified: Secondary | ICD-10-CM

## 2020-02-04 DIAGNOSIS — N946 Dysmenorrhea, unspecified: Secondary | ICD-10-CM | POA: Diagnosis not present

## 2020-02-04 DIAGNOSIS — Z23 Encounter for immunization: Secondary | ICD-10-CM

## 2020-02-04 LAB — VITAMIN D 25 HYDROXY (VIT D DEFICIENCY, FRACTURES): VITD: 21.76 ng/mL — ABNORMAL LOW (ref 30.00–100.00)

## 2020-02-04 LAB — TSH: TSH: 3.94 u[IU]/mL (ref 0.35–4.50)

## 2020-02-04 NOTE — Patient Instructions (Signed)
Use olive, avocado, coconut, ghee, butter  There is not one right eating plan for everyone.  It may take trial and error to find what will work for you.  It is important to get adequate protein and fiber with your meals.  It is okay to not eat breakfast or to skip meals if you are not hungry.  Avoid snacking between meals.  Unless you are on a fluid restriction, drink 80 to 90 ounces of water a day.  Suggested resources- www.dietdoctor.com/diabetes/diet www.adaptyourlifeacademy.com-there is a quiz to help you determine how many carbohydrates you should eat a day  www.thefastingmethod.com  If you have diabetes or access to a blood sugar machine, I recommend you check your blood sugar daily and keep a log.  Vary the time you check your blood sugar such as fasting, before meal, 2 hours after a meal and at bedtime.  Look for trends with the foods you are eating and be a scientist of your body.  Here are some guidelines to help you with meal planning -  Avoid all processed and packaged foods (bread, pasta, crackers, chips, etc) and beverages containing calories.  Avoid added sugars and excessive natural sugars.  Pay attention to how you feel if you consume artificial sweeteners.  Do they make you more hungry or raise your blood sugar?  With every meal and snack, aim to get 20 g of protein (3 ounces of meat, 4 ounces of fish, 3 eggs, protein powder, 1 cup [place] yogurt, 1 cup cottage cheese, etc.)  Increase fiber in the form of non-starchy vegetables.  These help you feel full with very little carbohydrates and are good for gut health.  Nonstarchy vegetables include summer squash, onions, peppers, tomatoes, eggplant, broccoli, cauliflower, cabbage, lettuce, spinach.  Have small amounts of good fats such as avocado, nuts, olive oil, nut butters, olives.  Add a little cheese to your meals to make them tasty.   Try to plan your meals for the week and do some meal preparation when able.  If possible, make  lunches for the week ahead of time.  Plan a couple of dinners and make enough so you can have leftovers.  Build in a treat once a week.

## 2020-02-04 NOTE — Progress Notes (Signed)
   Subjective:    Patient ID: Vanessa Adams, female    DOB: 1985/08/25, 34 y.o.   MRN: [id number]  HPI Chief Complaint  Patient presents with  . Menorrhagia    states don't want to continue taking birth control any longer  . Cyst    pelvic region and under arms.   This is a 34 yo female who presents today with above cc. She  has a past medical history of Allergy, Anxiety, Bell palsy, History of Bell's palsy, and Thyroid disease.  She is currently working part-time at [employer] which she enjoys.  Wants to come off of hormones, currently on Seasonale, still has several days of heavy bleeding.  Husband has had a vasectomy.  She has not [facility] in many years since her children were born.  Previous GYN out of network now.  History of recurrent cysts in axillary and groin region. Has done well on OCP.  Has some concerned about flare when she is off of hormones.  Obesity- has been going to gym 3 days a week. Some weight loss.  Has cut many carbs with gluten intolerance.    Review of Systems Denies headache, visual change, chest pain, shortness of breath, leg swelling    Objective:   Physical Exam Physical Exam  Constitutional: Oriented to person, place, and time. Appears well-developed and well-nourished.  Obese. HENT:  Head: Normocephalic and atraumatic.  Eyes: Conjunctivae are normal.  Neck: Normal range of motion. Neck supple.  Cardiovascular: Normal rate, regular rhythm and normal heart sounds.   Pulmonary/Chest: Effort normal and breath sounds normal.  Musculoskeletal: No lower extremity edema.   Neurological: Alert and oriented to person, place, and time.  Skin: Skin is warm and dry.  Psychiatric: Normal mood and affect. Behavior is normal. Judgment and thought content normal.  Vitals reviewed.     BP 122/76   Pulse 99   Temp (!) 97.3 F (36.3 C) (Temporal)   Ht 5\' 9"  (1.753 m)   Wt 273 lb (123.8 kg)   LMP  (LMP Unknown)   SpO2 97%   BMI 40.32 kg/m  Wt  Readings from Last 3 Encounters:  02/04/20 273 lb (123.8 kg)  05/28/19 286 lb 12.8 oz (130.1 kg)  10/15/18 282 lb (127.9 kg)       Assessment & Plan:  1. Dysmenorrhea -Discussed some available treatment options and she is interested in referral to gynecology to discuss possible uterine ablation - Ambulatory referral to Gynecology  2. Hypothyroidism, unspecified type - TSH  3. Vitamin D deficiency - Vitamin D, 25-hydroxy  4. Need for influenza vaccination - Flu Vaccine QUAD 6+ mos PF IM (Fluarix Quad PF)  5.  Class II severe obesity -Encouraged her efforts of regular exercise and improved eating habits, provided written and verbal information regarding healthy food choices, low carbohydrate diet  This visit occurred during the SARS-CoV-2 public health emergency.  Safety protocols were in place, including screening questions prior to the visit, additional usage of staff PPE, and extensive cleaning of exam room while observing appropriate contact time as indicated for disinfecting solutions.     12/15/18, FNP-BC  [facility] at [facility], [facility]  02/04/2020 3:43 PM

## 2020-02-11 DIAGNOSIS — Z1371 Encounter for nonprocreative screening for genetic disease carrier status: Secondary | ICD-10-CM

## 2020-02-11 HISTORY — DX: Encounter for nonprocreative screening for genetic disease carrier status: Z13.71

## 2020-02-16 IMAGING — US US THYROID
1 series · 13 of 25 positions shown · non-contrast
Comparison: None.

CLINICAL DATA: Hypothyroidism

EXAM:
THYROID ULTRASOUND
TECHNIQUE: Ultrasound examination of the thyroid gland and adjacent soft
tissues was performed.

[Series 1: us thyroid · 0.07mm/px · 13 of 50 slices shown]
[im 1/50]
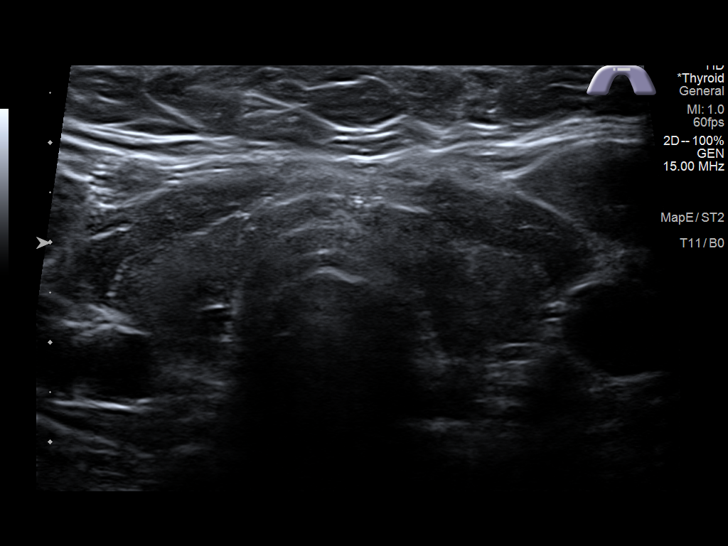
[im 5/50]
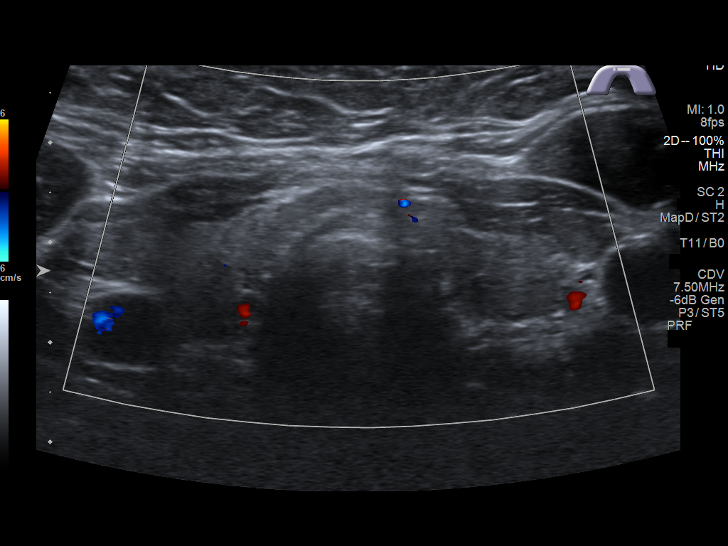
[im 9/50]
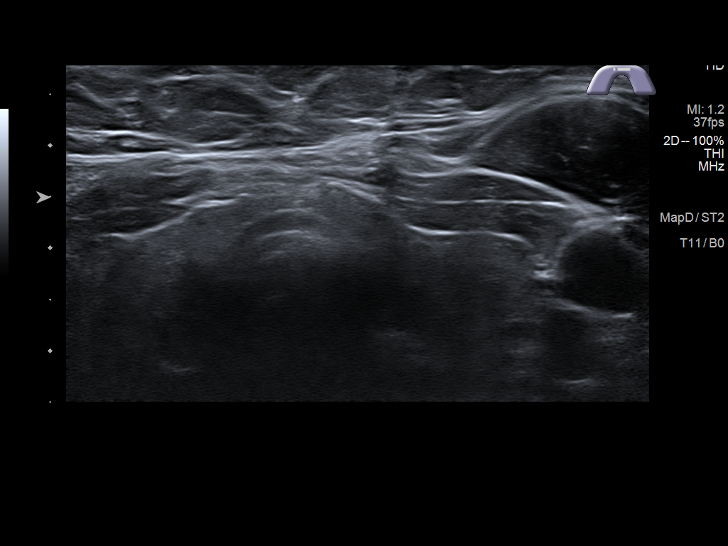
[im 13/50]
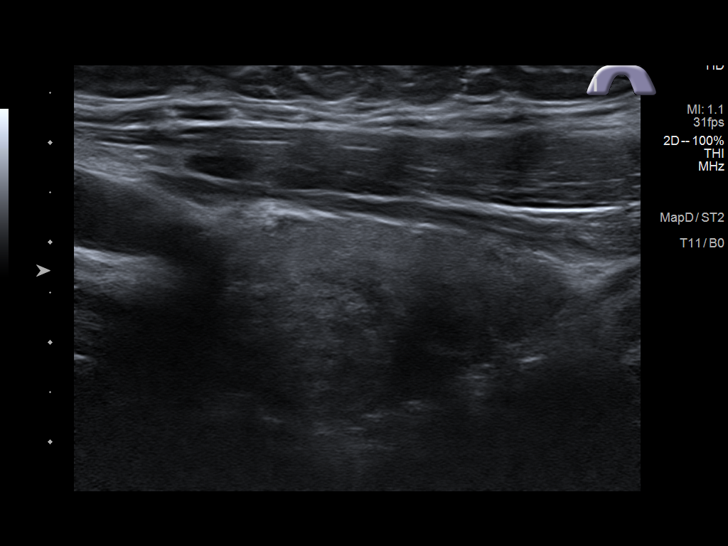
[im 17/50]
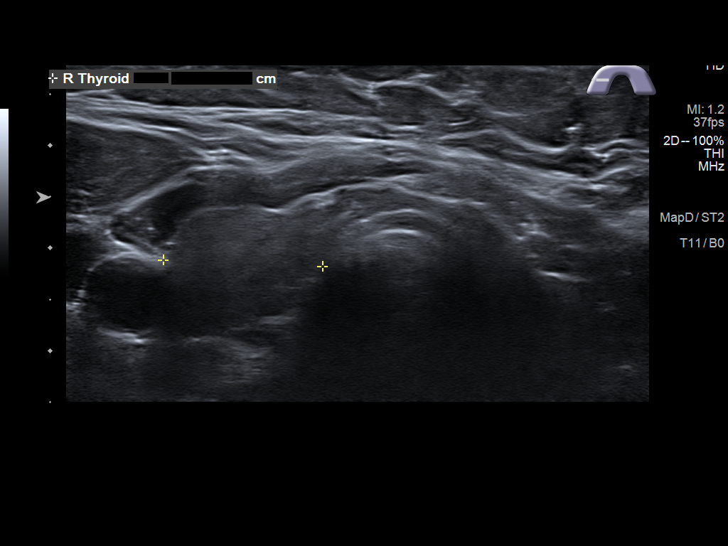
[im 21/50]
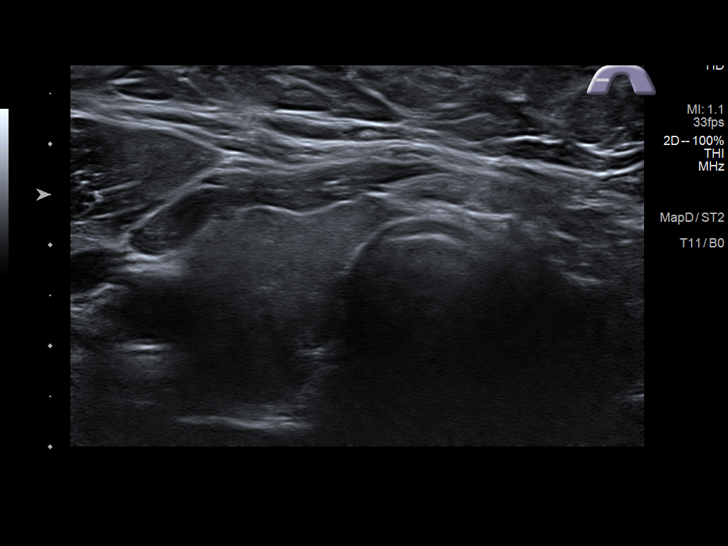
[im 25/50]
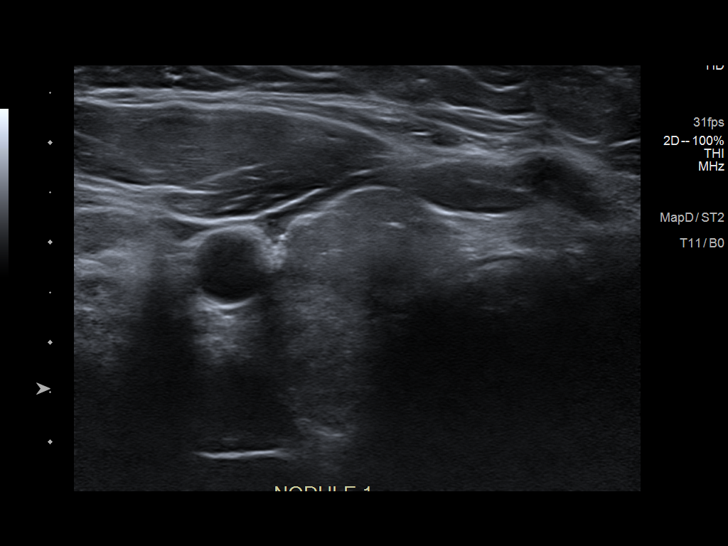
[im 29/50]
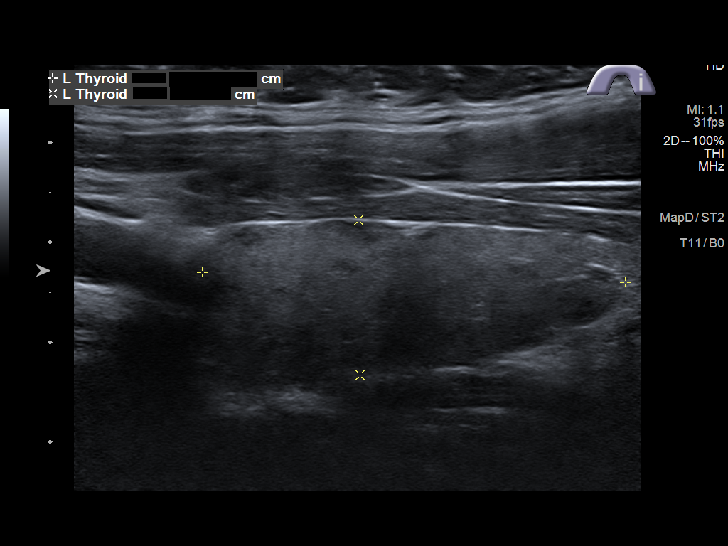
[im 33/50]
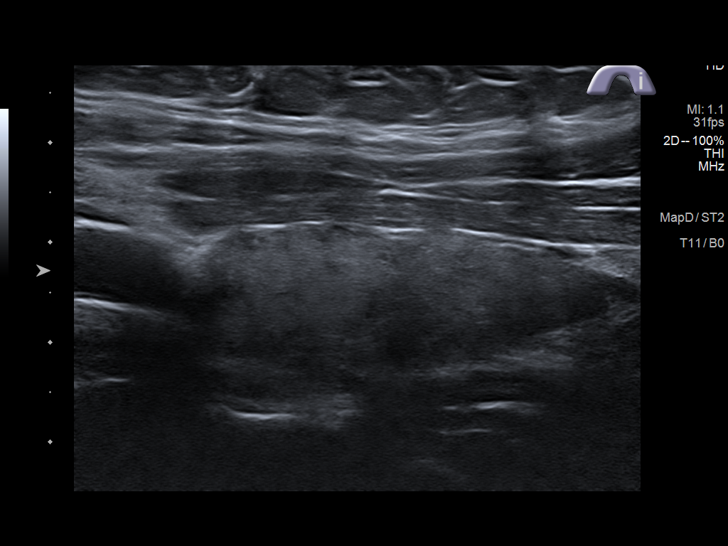
[im 37/50]
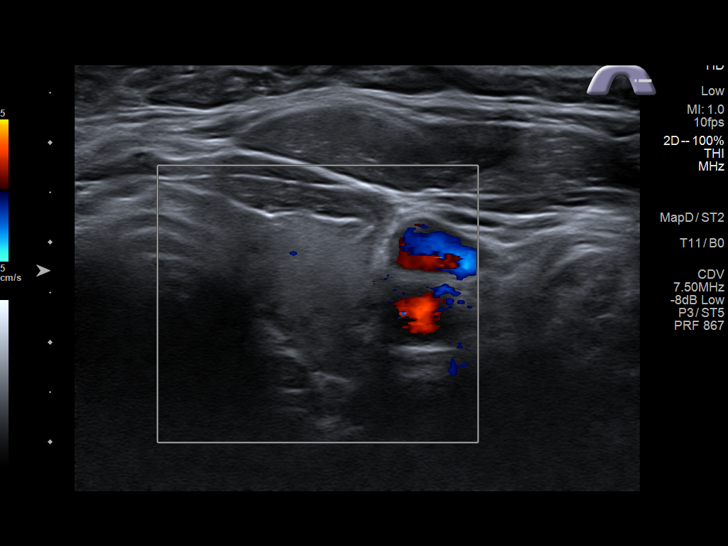
[im 41/50]
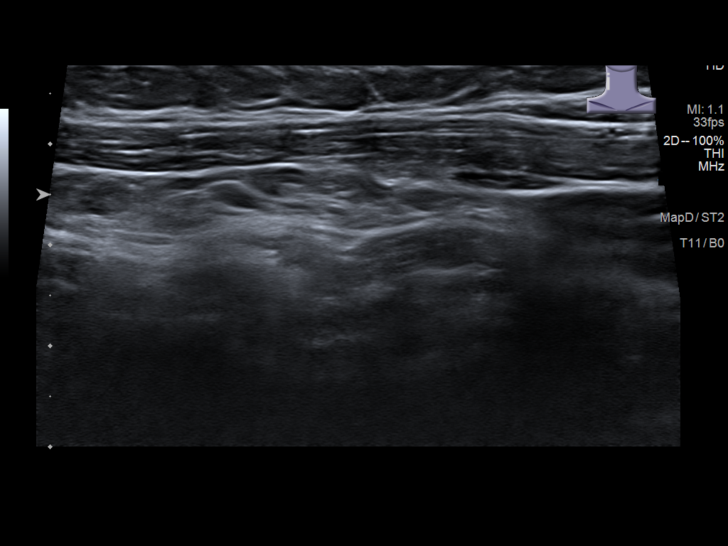
[im 45/50]
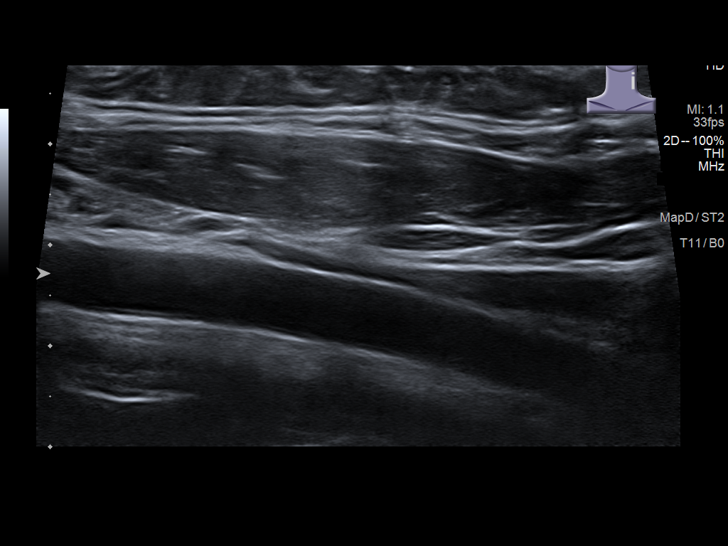
[im 50/50]
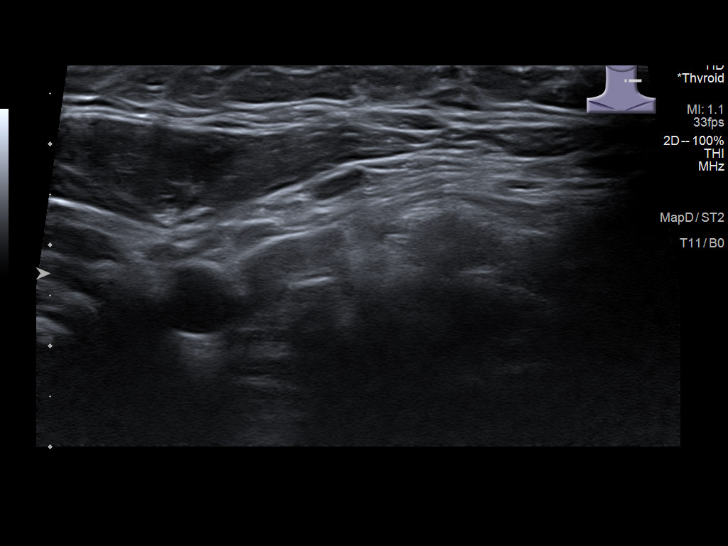

[13 of 25 positions shown; findings below may reference images not displayed]

FINDINGS: Parenchymal Echotexture: Moderately heterogeneous

Isthmus: 2 mm

Right lobe: 4.6 x 2.1 x 1.6 cm

Left lobe: 4.2 x 1.6 x 1.4 cm

_________________________________________________________

Estimated total number of nodules >/= 1 cm: 1

Number of spongiform nodules >/=  2 cm not described below (TR1): 0

Number of mixed cystic and solid nodules >/= 1.5 cm not described
below (TR2): 0

_________________________________________________________

Nodule # 1:

Location: Right; Mid

Maximum size: 1.8 cm; Other 2 dimensions: 1.5 x 1.2 cm

Composition: solid/almost completely solid (2)

Echogenicity: isoechoic (1)

Shape: taller-than-wide (3)

Margins: smooth (0)

Echogenic foci: none (0)

ACR TI-RADS total points: 6.

ACR TI-RADS risk category: TR4 (4-6 points).

ACR TI-RADS recommendations:

**Given size (>/= 1.5 cm) and appearance, fine needle aspiration of
this moderately suspicious nodule should be considered based on
TI-RADS criteria.

_________________________________________________________

No hypervascularity.  No regional adenopathy.
IMPRESSION: 1.8 cm right mid thyroid TR 4 nodule meets criteria biopsy as above.

Nonspecific moderate thyroid heterogeneity

The above is in keeping with the ACR TI-RADS recommendations - [HOSPITAL] 6769;[DATE].

## 2020-02-22 NOTE — Progress Notes (Signed)
Vanessa Belfast, Vanessa Adams   Chief Complaint  Patient presents with  . Dysmenorrhea    Heavy, painful and long periods. Would like to come off hormonal BC    HPI:      Vanessa Adams is a 34 y.o. [id number] whose LMP was Patient's last menstrual period was 02/18/2020 (exact date)., presents today for NP eval for endometrial ablation, referred by PCP. Pt is on OCPs for menometrorrhagia with sx improvement. Menses are Q3 months, lasting 2-3 days light flow, no BTB, mild dysmen. Off OCPs, periods are monthly, lasting 7 days, mod to heavy flow with clots, occas mid cycle spotting for 1 day, and worse dysmen, somewhat improved with NSAIDs/exercise.  Would like to stop hormones due to increased cystic nodules in groin/vulva and axilla. Would like endometrial ablation for flow.   She is sex active, husband with vasectomy.  Last pap 08/04/16 was neg cells with neg HPV DNA. Hx of ASCUS per pt with repeats, no tx done in past.   FH ovarian and colon cancer in her MGM. Her mom was gene neg ~20 yrs; no update testing done.   Past Medical History:  Diagnosis Date  . Allergy   . Anxiety   . Bell palsy    Bell's Palsy 2015  . History of Bell's palsy    affected left side  . Thyroid disease    hypothyroidism    Past Surgical History:  Procedure Laterality Date  . adenoids    . CHOLECYSTECTOMY  2007  . TONSILECTOMY/ADENOIDECTOMY WITH MYRINGOTOMY      Family History  Problem Relation Age of Onset  . Ovarian cysts Mother        has marker for ovarian cancer  . Hyperlipidemia Father   . Hypertension Father   . Cancer Maternal Grandmother 45       ovarian cancer, colon, rectal  . Hypothyroidism Paternal Grandmother   . Skin cancer Paternal Grandfather 76    Social History   Socioeconomic History  . Marital status: Married    Spouse name: Not on file  . Number of children: Not on file  . Years of education: Not on file  . Highest education level: Not on file  Occupational  History  . Not on file  Tobacco Use  . Smoking status: Former Games developer  . Smokeless tobacco: Never Used  . Tobacco comment: quit at age 53  Substance and Sexual Activity  . Alcohol use: Yes    Comment: rare  . Drug use: No  . Sexual activity: Yes    Birth control/protection: Pill  Other Topics Concern  . Not on file  Social History Narrative  . Not on file   Social Determinants of Health   Financial Resource Strain: Not on file  Food Insecurity: Not on file  Transportation Needs: Not on file  Physical Activity: Not on file  Stress: Not on file  Social Connections: Not on file  Intimate Partner Violence: Not on file    Outpatient Medications Prior to Visit  Medication Sig Dispense Refill  . Cholecalciferol (VITAMIN D3) 1.25 MG (50000 UT) TABS Take 1 tablet by mouth every 7 (seven) days. (Patient not taking: Reported on 02/04/2020) 12 tablet 1  . fluticasone (FLONASE) 50 MCG/ACT nasal spray Place 2 sprays into both nostrils daily. 16 g 6  . hydrOXYzine (ATARAX/VISTARIL) 25 MG tablet Take 0.5-1 tablets (12.5-25 mg total) by mouth every 8 (eight) hours as needed for anxiety. 30 tablet 0  .  levonorgestrel-ethinyl estradiol (SEASONALE) 0.15-0.03 MG tablet TAKE 1 TABLET BY MOUTH DAILY 91 tablet 3  . levothyroxine (SYNTHROID) 112 MCG tablet Take 1 tablet (112 mcg total) by mouth daily. 90 tablet 3  . sertraline (ZOLOFT) 100 MG tablet Take 1 tablet (100 mg total) by mouth daily. 90 tablet 3   No facility-administered medications prior to visit.      ROS:  Review of Systems  Constitutional: Negative for fever, malaise/fatigue and weight loss.  Gastrointestinal: Negative for blood in stool, constipation, diarrhea, nausea and vomiting.  Genitourinary: Negative for dyspareunia, dysuria, flank pain, frequency, hematuria, urgency, vaginal bleeding, vaginal discharge and vaginal pain.  Musculoskeletal: Negative for back pain.  Skin: Negative for itching and rash.  BREAST: No  symptoms   OBJECTIVE:   Vitals:  BP 120/70   Ht 5\' 8"  (1.727 m)   Wt 276 lb (125.2 kg)   LMP 02/18/2020 (Exact Date)   BMI 41.97 kg/m   Physical Exam Vitals reviewed.  Constitutional:      Appearance: She is well-developed.  Pulmonary:     Effort: Pulmonary effort is normal.  Genitourinary:    General: Normal vulva.     Pubic Area: No rash.      Labia:        Right: No rash, tenderness or lesion.        Left: No rash, tenderness or lesion.      Vagina: Normal. No vaginal discharge, erythema or tenderness.     Cervix: Normal.     Uterus: Normal. Not enlarged and not tender.      Adnexa: Right adnexa normal and left adnexa normal.       Right: No mass or tenderness.         Left: No mass or tenderness.    Musculoskeletal:        General: Normal range of motion.     Cervical back: Normal range of motion.  Skin:    General: Skin is warm and dry.  Neurological:     General: No focal deficit present.     Mental Status: She is alert and oriented to person, place, and time.  Psychiatric:        Mood and Affect: Mood normal.        Behavior: Behavior normal.        Thought Content: Thought content normal.        Judgment: Judgment normal.     Assessment/Plan: Menometrorrhagia--improved with OCPs but pt wants off hormones. Discussed endometrial ablation pros/cons/procedure. Pt will need to RTO with MD for consultation. Rule out cancer gene mutation first which could change procedure options. Will call with results. If neg, will sched appt with MD.  Vulvar cyst--no sx today. No evid of scarring suggestive of HS, so could just be hormonally related.  Family history of ovarian cancer--MyRisk testing discussed and done today. Will call with results.     Return if symptoms worsen or fail to improve.  Nilani Hugill B. Shilo Philipson, PA-C 02/23/2020 4:27 PM

## 2020-02-23 ENCOUNTER — Encounter: Payer: Self-pay | Admitting: Obstetrics and Gynecology

## 2020-02-23 ENCOUNTER — Other Ambulatory Visit: Payer: Self-pay

## 2020-02-23 ENCOUNTER — Ambulatory Visit (INDEPENDENT_AMBULATORY_CARE_PROVIDER_SITE_OTHER): Payer: No Typology Code available for payment source | Admitting: Obstetrics and Gynecology

## 2020-02-23 VITALS — BP 120/70 | Ht 68.0 in | Wt 276.0 lb

## 2020-02-23 DIAGNOSIS — N907 Vulvar cyst: Secondary | ICD-10-CM | POA: Diagnosis not present

## 2020-02-23 DIAGNOSIS — Z8041 Family history of malignant neoplasm of ovary: Secondary | ICD-10-CM

## 2020-02-23 DIAGNOSIS — N921 Excessive and frequent menstruation with irregular cycle: Secondary | ICD-10-CM | POA: Insufficient documentation

## 2020-02-23 NOTE — Patient Instructions (Signed)
I value your feedback and entrusting us with your care. If you get a [facility] patient survey, I would appreciate you taking the time to let us know about your experience today. Thank you!  As of February 20, 2019, your lab results will be released to your MyChart immediately, before I even have a chance to see them. Please give me time to review them and contact you if there are any abnormalities. Thank you for your patience.  

## 2020-03-04 ENCOUNTER — Encounter: Payer: Self-pay | Admitting: Obstetrics and Gynecology

## 2020-03-23 ENCOUNTER — Ambulatory Visit: Payer: No Typology Code available for payment source | Admitting: Family Medicine

## 2020-03-23 DIAGNOSIS — Z0289 Encounter for other administrative examinations: Secondary | ICD-10-CM

## 2020-03-25 ENCOUNTER — Encounter: Payer: Self-pay | Admitting: Obstetrics and Gynecology

## 2020-03-31 ENCOUNTER — Telehealth: Payer: Self-pay | Admitting: Obstetrics and Gynecology

## 2020-03-31 NOTE — Telephone Encounter (Signed)
This encounter was created in error - please disregard.

## 2020-03-31 NOTE — Telephone Encounter (Signed)
Pt aware of neg MyRisk results. FH ovar/colon cancer. No further screening recommended at this time. Given the neg results, pt would like to proceed with endometrial ablation for menorrhagia. Will sched appt with MD.  Patient understands these results only apply to her and her children, and this is not indicative of genetic testing results of her other family members. It is recommended that her other family members have genetic testing done.  Pt also understands negative genetic testing doesn't mean she will never get any of these cancers.   Hard copy mailed to pt. F/u prn.

## 2020-03-31 NOTE — Telephone Encounter (Signed)
Pt returned call in regards to test results  

## 2020-04-12 ENCOUNTER — Ambulatory Visit: Payer: No Typology Code available for payment source | Admitting: Obstetrics and Gynecology

## 2020-07-19 ENCOUNTER — Encounter: Payer: Self-pay | Admitting: Emergency Medicine

## 2020-07-19 ENCOUNTER — Ambulatory Visit
Admission: EM | Admit: 2020-07-19 | Discharge: 2020-07-19 | Disposition: A | Discharge status: Home / Self Care | Payer: No Typology Code available for payment source

## 2020-07-19 ENCOUNTER — Other Ambulatory Visit: Payer: Self-pay

## 2020-07-19 DIAGNOSIS — J020 Streptococcal pharyngitis: Secondary | ICD-10-CM

## 2020-07-19 DIAGNOSIS — J069 Acute upper respiratory infection, unspecified: Secondary | ICD-10-CM

## 2020-07-19 LAB — POCT RAPID STREP A (OFFICE): Rapid Strep A Screen: POSITIVE — AB

## 2020-07-19 MED ORDER — PENICILLIN G BENZATHINE 1200000 UNIT/2ML IM SUSY: 1.2000 10*6.[IU] | PREFILLED_SYRINGE | Freq: Once | INTRAMUSCULAR | Status: DC

## 2020-07-19 NOTE — Discharge Instructions (Signed)
You were given an injection of long-acting penicillin today for treatment of strep throat.  Take Tylenol or ibuprofen as needed for fever or discomfort.  Take plain over-the-counter Mucinex as needed for congestion.    Follow up with your primary care provider if your symptoms are not improving.

## 2020-07-19 NOTE — ED Triage Notes (Signed)
Onset Saturday of symptoms.  Patient has a sore throat, both ears are hurting.  Left ear is pre painful that right ear ear, and has a cough.  Has been taking allergy med and flonase and no relief

## 2020-07-19 NOTE — ED Provider Notes (Signed)
Roderic Palau    CSN: [id number] Arrival date & time: 07/19/20  0833      History   Chief Complaint Chief Complaint  Patient presents with  . Cough  . Sore Throat  . Emesis    HPI Vanessa Adams is a 35 y.o. female.   Patient presents with 3-day history of bilateral ear pain, sore throat, postnasal drip, runny nose, nonproductive cough, and shortness of breath.  Treatment attempted at home with OTC allergy medication.  She denies fever, chills, rash, vomiting, diarrhea, or other symptoms.  Patient requests strep test.  She declines COVID test.  Her medical history includes hypothyroidism, obesity, anxiety, depression, dysmenorrhea.  The history is provided by the patient and medical records.    Past Medical History:  Diagnosis Date  . Allergy   . Anxiety   . Bell palsy    Bell's Palsy 2015  . BRCA negative 02/2020   MyRisk neg; IBIS=13.4%/riskscore=7.4%  . Family history of ovarian cancer   . History of Bell's palsy    affected left side  . Thyroid disease    hypothyroidism    Patient Active Problem List   Diagnosis Date Noted  . Menometrorrhagia 02/23/2020  . Dysmenorrhea 02/04/2020  . Vitamin D deficiency 02/04/2020  . Contraception management 12/26/2017  . Anxiety and depression 02/25/2014  . Hypertriglyceridemia 11/04/2012  . Class 3 severe obesity due to excess calories without serious comorbidity with body mass index (BMI) of 40.0 to 44.9 in adult ([place]) 06/02/2011  . Thyroid disease     Past Surgical History:  Procedure Laterality Date  . adenoids    . CHOLECYSTECTOMY  2007  . TONSILECTOMY/ADENOIDECTOMY WITH MYRINGOTOMY      OB History    Gravida  3   Para  2   Term  2   Preterm      AB  1   Living  2     SAB  1   IAB      Ectopic      Multiple  0   Live Births  2        Obstetric Comments  Menstrual age: 20 Age 1st Pregnancy: 31          Home Medications    Prior to Admission medications   Medication Sig  Start Date End Date Taking? Authorizing Provider  levonorgestrel-ethinyl estradiol (SEASONALE) 0.15-0.03 MG tablet TAKE 1 TABLET BY MOUTH DAILY 08/19/19 08/18/20 Yes Elby Beck, FNP  LEVOTHYROXINE SODIUM PO Take by mouth.   Yes [provider]  sertraline (ZOLOFT) 100 MG tablet Take 100 mg by mouth daily.   Yes [provider]  Cholecalciferol (VITAMIN D3) 1.25 MG (50000 UT) TABS Take 1 tablet by mouth every 7 (seven) days. Patient not taking: Reported on 02/04/2020 05/23/19   Elby Beck, FNP  fluticasone [facility]) 50 MCG/ACT nasal spray Place 2 sprays into both nostrils daily. 03/20/18   Elby Beck, FNP  hydrOXYzine (ATARAX/VISTARIL) 25 MG tablet Take 0.5-1 tablets (12.5-25 mg total) by mouth every 8 (eight) hours as needed for anxiety. 05/28/19   Elby Beck, FNP  levothyroxine (SYNTHROID) 112 MCG tablet TAKE 1 TABLET (112 MCG TOTAL) BY MOUTH DAILY. 05/23/19 05/22/20  Elby Beck, FNP  sertraline (ZOLOFT) 100 MG tablet TAKE 1 TABLET (100 MG TOTAL) BY MOUTH DAILY. 05/28/19 05/27/20  Elby Beck, FNP    Family History Family History  Problem Relation Age of Onset  . Ovarian cysts Mother  has marker for ovarian cancer  . Hyperlipidemia Father   . Hypertension Father   . Cancer Maternal Grandmother 45       ovarian cancer, colon, rectal  . Hypothyroidism Paternal Grandmother   . Skin cancer Paternal Grandfather 4    Social History Social History   Tobacco Use  . Smoking status: Former Research scientist (life sciences)  . Smokeless tobacco: Never Used  . Tobacco comment: quit at age 76  Vaping Use  . Vaping Use: Never used  Substance Use Topics  . Alcohol use: Yes    Comment: rare  . Drug use: No     Allergies   Sulfa antibiotics, Tuberculin tests, Percocet [oxycodone-acetaminophen], and Vicodin [hydrocodone-acetaminophen]   Review of Systems Review of Systems  Constitutional: Negative for chills and fever.  HENT: Positive for congestion,  ear pain, postnasal drip, rhinorrhea and sore throat.   Respiratory: Positive for cough and shortness of breath.   Cardiovascular: Negative for chest pain and palpitations.  Gastrointestinal: Negative for abdominal pain, diarrhea and vomiting.  Skin: Negative for color change and rash.  All other systems reviewed and are negative.    Physical Exam Triage Vital Signs ED Triage Vitals  Enc Vitals Group     BP      Pulse      Resp      Temp      Temp src      SpO2      Weight      Height      Head Circumference      Peak Flow      Pain Score      Pain Loc      Pain Edu?      Excl. in [place]?    No data found.  Updated Vital Signs BP 116/83 (BP Location: Right Arm) Comment (BP Location): large cuff  Pulse 96   Temp 98.5 F (36.9 C) (Oral)   Resp 20   SpO2 97%   Visual Acuity Right Eye Distance:   Left Eye Distance:   Bilateral Distance:    Right Eye Near:   Left Eye Near:    Bilateral Near:     Physical Exam Vitals and nursing note reviewed.  Constitutional:      General: She is not in acute distress.    Appearance: She is well-developed.  HENT:     Head: Normocephalic and atraumatic.     Right Ear: Tympanic membrane normal.     Left Ear: Tympanic membrane normal.     Nose: Rhinorrhea present.     Mouth/Throat:     Mouth: Mucous membranes are moist.     Pharynx: Posterior oropharyngeal erythema present.     Tonsils: 1+ on the right. 1+ on the left.  Eyes:     Conjunctiva/sclera: Conjunctivae normal.  Cardiovascular:     Rate and Rhythm: Normal rate and regular rhythm.     Heart sounds: Normal heart sounds.  Pulmonary:     Effort: Pulmonary effort is normal. No respiratory distress.     Breath sounds: Normal breath sounds.  Abdominal:     Palpations: Abdomen is soft.     Tenderness: There is no abdominal tenderness.  Musculoskeletal:     Cervical back: Neck supple.  Skin:    General: Skin is warm and dry.     Findings: No rash.  Neurological:      General: No focal deficit present.     Mental Status: She is alert and oriented to  person, place, and time.     Gait: Gait normal.  Psychiatric:        Mood and Affect: Mood normal.        Behavior: Behavior normal.      UC Treatments / Results  Labs (all labs ordered are listed, but only abnormal results are displayed) Labs Reviewed  POCT RAPID STREP A (OFFICE) - Abnormal; Notable for the following components:      Result Value   Rapid Strep A Screen Positive (*)    All other components within normal limits    EKG   Radiology No results found.  Procedures Procedures (including critical care time)  Medications Ordered in UC Medications  penicillin g benzathine (BICILLIN LA) 1200000 UNIT/2ML injection 1.2 Million Units (has no administration in time range)    Initial Impression / Assessment and Plan / UC Course  I have reviewed the triage vital signs and the nursing notes.  Pertinent labs & imaging results that were available during my care of the patient were reviewed by me and considered in my medical decision making (see chart for details).   Strep pharyngitis, URI.  Bicillin LA given here per patient preference.  She declines COVID test.  Discussed other symptomatic treatment including Tylenol or ibuprofen as needed for fever or discomfort and Mucinex as needed for congestion.  Instructed her to follow-up with her PCP if her symptoms are not improving.  She agrees to plan of care.   Final Clinical Impressions(s) / UC Diagnoses   Final diagnoses:  Strep pharyngitis  Upper respiratory tract infection, unspecified type     Discharge Instructions     You were given an injection of long-acting penicillin today for treatment of strep throat.  Take Tylenol or ibuprofen as needed for fever or discomfort.  Take plain over-the-counter Mucinex as needed for congestion.    Follow up with your primary care provider if your symptoms are not improving.        ED  Prescriptions    None     PDMP not reviewed this encounter.   Sharion Balloon, NP 07/19/20 [phone]

## 2020-07-22 ENCOUNTER — Other Ambulatory Visit: Payer: Self-pay

## 2020-07-22 ENCOUNTER — Ambulatory Visit (INDEPENDENT_AMBULATORY_CARE_PROVIDER_SITE_OTHER): Payer: No Typology Code available for payment source | Admitting: Obstetrics and Gynecology

## 2020-07-22 ENCOUNTER — Encounter: Payer: Self-pay | Admitting: Obstetrics and Gynecology

## 2020-07-22 VITALS — BP 124/72 | Wt 279.0 lb

## 2020-07-22 DIAGNOSIS — N920 Excessive and frequent menstruation with regular cycle: Secondary | ICD-10-CM | POA: Diagnosis not present

## 2020-07-25 NOTE — Progress Notes (Signed)
Patient ID: Vanessa Adams, female   DOB: 10/11/1985, 35 y.o.   MRN: [id number]  Reason for Consult: Gynecologic Exam   Referred by No ref. provider found  Subjective:     HPI:  Vanessa Adams is a 35 y.o. female. She presents today for a consultation regarding endometrial ablation. She reports that she is currently using extended cycle OCP. She would like to no longer have a period and desires more effective contraception. Her partner has had a vasectomy. She would like to no longer take hormonal birth control.  She would also like to achieve amenorrhea.  She reports that her OCP is causing flare ups of lumps in her groin and armpits. She reports that she has had the lumps evaluated before and been told she has lipomas.   Gynecological History  No LMP recorded. (Menstrual status: Oral contraceptives).  Obstetrical History OB History  Gravida Para Term Preterm AB Living  _0 SAB IAB Ectopic Multiple Live Births  1     0 2    # Outcome Date GA Lbr Len/2nd Weight Sex Delivery Anes PTL Lv  3 Term 02/15/17 [id number]:40 / 00:23 9 lb 1 oz (4.11 kg) M Vag-Spont EPI  LIV  2 SAB 2015          1 Term 11/28/09     Vag-Spont   LIV    Obstetric Comments  Menstrual age: [age] Age 1st Pregnancy: 25    Past Medical History:  Diagnosis Date  . Allergy   . Anxiety   . Bell palsy    Bell's Palsy 2015  . BRCA negative 02/2020   MyRisk neg; IBIS=13.4%/riskscore=7.4%  . Family history of ovarian cancer   . History of Bell's palsy    affected left side  . Thyroid disease    hypothyroidism   Family History  Problem Relation Age of Onset  . Ovarian cysts Mother        has marker for ovarian cancer  . Hyperlipidemia Father   . Hypertension Father   . Cancer Maternal Grandmother 45       ovarian cancer, colon, rectal  . Hypothyroidism Paternal Grandmother   . Skin cancer Paternal Grandfather 767  Past Surgical History:  Procedure Laterality Date  . adenoids    .  CHOLECYSTECTOMY  2007  . TONSILECTOMY/ADENOIDECTOMY WITH MYRINGOTOMY      Short Social History:  Social History   Tobacco Use  . Smoking status: Former SResearch scientist (life sciences) . Smokeless tobacco: Never Used  . Tobacco comment: quit at age 35 Substance Use Topics  . Alcohol use: Yes    Comment: rare    Allergies  Allergen Reactions  . Sulfa Antibiotics Rash    stevens-johnsons   . Tuberculin Tests Hives  . Percocet [Oxycodone-Acetaminophen] Itching  . Vicodin [Hydrocodone-Acetaminophen] Itching    Current Outpatient Medications  Medication Sig Dispense Refill  . Cholecalciferol (VITAMIN D3) 1.25 MG (50000 UT) TABS Take 1 tablet by mouth every 7 (seven) days. 12 tablet 1  . fluticasone (FLONASE) 50 MCG/ACT nasal spray Place 2 sprays into both nostrils daily. 16 g 6  . hydrOXYzine (ATARAX/VISTARIL) 25 MG tablet Take 0.5-1 tablets (12.5-25 mg total) by mouth every 8 (eight) hours as needed for anxiety. 30 tablet 0  . levonorgestrel-ethinyl estradiol (SEASONALE) 0.15-0.03 MG tablet TAKE 1 TABLET BY MOUTH DAILY 91 tablet 3  . LEVOTHYROXINE SODIUM PO Take by mouth.    .Marland Kitchen  sertraline (ZOLOFT) 100 MG tablet Take 100 mg by mouth daily.    Marland Kitchen levothyroxine (SYNTHROID) 112 MCG tablet TAKE 1 TABLET (112 MCG TOTAL) BY MOUTH DAILY. 90 tablet 3  . sertraline (ZOLOFT) 100 MG tablet TAKE 1 TABLET (100 MG TOTAL) BY MOUTH DAILY. 90 tablet 3   No current facility-administered medications for this visit.    Review of Systems  Constitutional: Negative for chills, fatigue, fever and unexpected weight change.  HENT: Negative for trouble swallowing.  Eyes: Negative for loss of vision.  Respiratory: Negative for cough, shortness of breath and wheezing.  Cardiovascular: Negative for chest pain, leg swelling, palpitations and syncope.  GI: Negative for abdominal pain, blood in stool, diarrhea, nausea and vomiting.  GU: Negative for difficulty urinating, dysuria, frequency and hematuria.  Musculoskeletal: Negative  for back pain, leg pain and joint pain.  Skin: Negative for rash.  Neurological: Negative for dizziness, headaches, light-headedness, numbness and seizures.  Psychiatric: Negative for behavioral problem, confusion, depressed mood and sleep disturbance.        Objective:  Objective   Vitals:   07/22/20 1002  BP: 124/72  Weight: 279 lb (126.6 kg)   Body mass index is 42.42 kg/m.  Physical Exam Vitals and nursing note reviewed. Exam conducted with a chaperone present.  Constitutional:      Appearance: Normal appearance.  HENT:     Head: Normocephalic and atraumatic.  Eyes:     Extraocular Movements: Extraocular movements intact.     Pupils: Pupils are equal, round, and reactive to light.  Pulmonary:     Effort: Pulmonary effort is normal.  Abdominal:     General: Abdomen is flat.     Palpations: Abdomen is soft.  Genitourinary:    Comments: Declined pelvic exam Musculoskeletal:     Cervical back: Normal range of motion.  Skin:    General: Skin is warm and dry.  Neurological:     General: No focal deficit present.     Mental Status: She is alert and oriented to person, place, and time.    Assessment/Plan:     35 yo interested in an endometrial ablation We discussed that given her age she has a high likelihood with an endometrial ablation of having a return of menses.  We also discussed that endometrial ablations cause significant scarring which make sampling the endometrium difficult to impossible after the procedure. This is not ideal since she does have risk factor for endometrial cancer including family history and elevated BMI.  We also discussed that we would need to do an ultrasound to evaluate the size and shape of the uterus. We also discussed that an endometrial ablation is not a form of contraception and she would still need to used a method of contraception.  I said that I was willing to do the procedure assuming she had a normal pelvic US, but that it was  important for her to be informed about the procedure.   I also discussed that a Mirena IUD, depo provera, or Nexplanon would be other options for achieving amenorrhea and effective contraception options.     I also discussed that her symptoms of lumps in her groin and armpits may be hidradenitis and could be treated with topical medication and hibiclens soap, she declined exam.  After I informed her about these things she was upset and said that she wanted to leave the office  More than 30 minutes were spent face to face with the patient in the room, reviewing the  medical record, labs and images, and coordinating care for the patient. The plan of management was discussed in detail and counseling was provided.    Adrian Prows MD [facility], [facility] 07/25/2020 1:41 AM

## 2020-07-27 ENCOUNTER — Encounter: Payer: Self-pay | Admitting: Family Medicine

## 2020-07-27 ENCOUNTER — Other Ambulatory Visit: Payer: Self-pay

## 2020-07-27 ENCOUNTER — Ambulatory Visit (INDEPENDENT_AMBULATORY_CARE_PROVIDER_SITE_OTHER): Payer: No Typology Code available for payment source | Admitting: Family Medicine

## 2020-07-27 VITALS — BP 134/76 | HR 85 | Temp 98.1°F | Ht 68.0 in | Wt 274.0 lb

## 2020-07-27 DIAGNOSIS — N921 Excessive and frequent menstruation with irregular cycle: Secondary | ICD-10-CM | POA: Diagnosis not present

## 2020-07-27 DIAGNOSIS — Z3041 Encounter for surveillance of contraceptive pills: Secondary | ICD-10-CM | POA: Diagnosis not present

## 2020-07-27 DIAGNOSIS — N946 Dysmenorrhea, unspecified: Secondary | ICD-10-CM

## 2020-07-27 NOTE — Patient Instructions (Signed)
I placed a gyn referral  You will get a call to set that up   Take care of yourself  Keep working out!

## 2020-07-27 NOTE — Assessment & Plan Note (Signed)
Partner had vasectomy  She is on the pill for period management  Interested in nexplanon instead  Counseled re: other methods  Ref to gyn

## 2020-07-27 NOTE — Progress Notes (Signed)
Subjective:    Patient ID: Vanessa Adams, female    DOB: November 13, 1985, 35 y.o.   MRN: [id number]  This visit occurred during the SARS-CoV-2 public health emergency.  Safety protocols were in place, including screening questions prior to the visit, additional usage of staff PPE, and extensive cleaning of exam room while observing appropriate contact time as indicated for disinfecting solutions.    HPI 35 yo pt of NP Carlean Purl presents to discuss contraception   Wt Readings from Last 3 Encounters:  07/27/20 274 lb (124.3 kg)  07/22/20 279 lb (126.6 kg)  02/23/20 276 lb (125.2 kg)   41.66 kg/m   going to the gym and loosing weight  Working hard on it  3 d per week  32.65 kg/m  She saw GYN 5/12 (reviewed note today) Noted G3P2 Discussed option of endometrial ablation - did not recommend it since it may make endometrial sampling difficult later  She has a fam h/o gyn  cancer and also elevated BMI Would still need contraception even with ablation   She did genetic testing and did not have "the gene"  She is upset with [facility]   Has taken seasonale OC  Does menses every 6 month  Hassle to take  She worries about interaction with antibiotics    H/o painful and heavy menses (7-10 days and very bad if not on pill)  Does not want the IUD at all -uncomfortable with that   nexplanon- discussed as well - interested in that (? May eventually stop menses) Husband has a vasectomy   Tried the nuva ring and did not like it  Nauseated with yaz   Has some hydradenitis like cysts in groin and axillae   Last pap was 5/18   BP Readings from Last 3 Encounters:  07/27/20 134/76  07/22/20 124/72  07/19/20 116/83   Pulse Readings from Last 3 Encounters:  07/27/20 85  07/19/20 96  02/04/20 99   Patient Active Problem List   Diagnosis Date Noted  . Menometrorrhagia 02/23/2020  . Dysmenorrhea 02/04/2020  . Vitamin D deficiency 02/04/2020  . Contraception management  12/26/2017  . Anxiety and depression 02/25/2014  . Hypertriglyceridemia 11/04/2012  . Class 3 severe obesity due to excess calories without serious comorbidity with body mass index (BMI) of 40.0 to 44.9 in adult ([place]) 06/02/2011  . Thyroid disease    Past Medical History:  Diagnosis Date  . Allergy   . Anxiety   . Bell palsy    Bell's Palsy 2015  . BRCA negative 02/2020   MyRisk neg; IBIS=13.4%/riskscore=7.4%  . Family history of ovarian cancer   . History of Bell's palsy    affected left side  . Thyroid disease    hypothyroidism   Past Surgical History:  Procedure Laterality Date  . adenoids    . CHOLECYSTECTOMY  2007  . TONSILECTOMY/ADENOIDECTOMY WITH MYRINGOTOMY     Social History   Tobacco Use  . Smoking status: Former Research scientist (life sciences)  . Smokeless tobacco: Never Used  . Tobacco comment: quit at age 21  Vaping Use  . Vaping Use: Never used  Substance Use Topics  . Alcohol use: Yes    Comment: rare  . Drug use: No   Family History  Problem Relation Age of Onset  . Ovarian cysts Mother        has marker for ovarian cancer  . Hyperlipidemia Father   . Hypertension Father   . Cancer Maternal Grandmother 53  ovarian cancer, colon, rectal  . Hypothyroidism Paternal Grandmother   . Skin cancer Paternal Grandfather 45   Allergies  Allergen Reactions  . Sulfa Antibiotics Rash    stevens-johnsons   . Tuberculin Tests Hives  . Percocet [Oxycodone-Acetaminophen] Itching  . Vicodin [Hydrocodone-Acetaminophen] Itching   Current Outpatient Medications on File Prior to Visit  Medication Sig Dispense Refill  . fluticasone (FLONASE) 50 MCG/ACT nasal spray Place 2 sprays into both nostrils daily. 16 g 6  . hydrOXYzine (ATARAX/VISTARIL) 25 MG tablet Take 0.5-1 tablets (12.5-25 mg total) by mouth every 8 (eight) hours as needed for anxiety. 30 tablet 0  . levonorgestrel-ethinyl estradiol (SEASONALE) 0.15-0.03 MG tablet TAKE 1 TABLET BY MOUTH DAILY 91 tablet 3  . LEVOTHYROXINE  SODIUM PO Take by mouth.    . sertraline (ZOLOFT) 100 MG tablet Take 100 mg by mouth daily.    Marland Kitchen levothyroxine (SYNTHROID) 112 MCG tablet TAKE 1 TABLET (112 MCG TOTAL) BY MOUTH DAILY. 90 tablet 3  . sertraline (ZOLOFT) 100 MG tablet TAKE 1 TABLET (100 MG TOTAL) BY MOUTH DAILY. 90 tablet 3   No current facility-administered medications on file prior to visit.     Review of Systems  Constitutional: Negative for activity change, appetite change, fatigue, fever and unexpected weight change.  HENT: Negative for congestion, ear pain, rhinorrhea, sinus pressure and sore throat.   Eyes: Negative for pain, redness and visual disturbance.  Respiratory: Negative for cough, shortness of breath and wheezing.   Cardiovascular: Negative for chest pain and palpitations.  Gastrointestinal: Negative for abdominal pain, blood in stool, constipation and diarrhea.  Endocrine: Negative for polydipsia and polyuria.  Genitourinary: Positive for menstrual problem. Negative for dysuria, frequency and urgency.  Musculoskeletal: Negative for arthralgias, back pain and myalgias.  Skin: Negative for pallor and rash.  Allergic/Immunologic: Negative for environmental allergies.  Neurological: Negative for dizziness, syncope and headaches.  Hematological: Negative for adenopathy. Does not bruise/bleed easily.  Psychiatric/Behavioral: Negative for decreased concentration and dysphoric mood. The patient is not nervous/anxious.        Objective:   Physical Exam Constitutional:      General: She is not in acute distress.    Appearance: Normal appearance. She is well-developed. She is obese. She is not ill-appearing or diaphoretic.  HENT:     Head: Normocephalic and atraumatic.  Eyes:     General: No scleral icterus.    Conjunctiva/sclera: Conjunctivae normal.     Pupils: Pupils are equal, round, and reactive to light.  Cardiovascular:     Rate and Rhythm: Normal rate and regular rhythm.     Heart sounds: Normal  heart sounds.  Pulmonary:     Effort: Pulmonary effort is normal. No respiratory distress.     Breath sounds: Normal breath sounds. No wheezing or rales.  Abdominal:     General: Bowel sounds are normal. There is no distension.     Palpations: Abdomen is soft. There is no mass.     Tenderness: There is no abdominal tenderness. There is no guarding or rebound.     Comments: No suprapubic tenderness or fullness    Musculoskeletal:     Cervical back: Normal range of motion and neck supple.     Right lower leg: No edema.     Left lower leg: No edema.  Lymphadenopathy:     Cervical: No cervical adenopathy.  Skin:    General: Skin is warm and dry.     Coloration: Skin is not pale.  Findings: No erythema.  Neurological:     Mental Status: She is alert.  Psychiatric:     Comments: Pt voices frustration re: menstrual problems            Assessment & Plan:   Problem List Items Addressed This Visit      Genitourinary   Dysmenorrhea - Primary    In G63P2 female  Has seen gyn and originally wanted an endometrial ablation but it was not recommended  She dislikes OC (on seasonale with 2 menses per year currently) Disliked IUD and nuva ring  Interested in nexplanon for convenience  Husband has also had a vasectomy Ref to gyn to discuss this as an option       Relevant Orders   Ambulatory referral to Gynecology     Other   Contraception management    Partner had vasectomy  She is on the pill for period management  Interested in nexplanon instead  Counseled re: other methods  Ref to gyn      Relevant Orders   Ambulatory referral to Gynecology   Menometrorrhagia    Ref to gyn to discuss nexplanon as an option       Relevant Orders   Ambulatory referral to Gynecology

## 2020-07-27 NOTE — Assessment & Plan Note (Signed)
Ref to gyn to discuss nexplanon as an option

## 2020-07-27 NOTE — Assessment & Plan Note (Signed)
In G3P2 female  Has seen gyn and originally wanted an endometrial ablation but it was not recommended  She dislikes OC (on seasonale with 2 menses per year currently) Disliked IUD and nuva ring  Interested in nexplanon for convenience  Husband has also had a vasectomy Ref to gyn to discuss this as an option

## 2020-09-03 LAB — HM PAP SMEAR: HPV 16/18/45 genotyping: NEGATIVE

## 2020-11-09 ENCOUNTER — Other Ambulatory Visit: Payer: Self-pay | Admitting: Family Medicine

## 2020-11-09 ENCOUNTER — Other Ambulatory Visit: Payer: Self-pay

## 2020-11-09 DIAGNOSIS — E039 Hypothyroidism, unspecified: Secondary | ICD-10-CM

## 2020-11-09 DIAGNOSIS — F32A Depression, unspecified: Secondary | ICD-10-CM

## 2020-11-09 MED ORDER — SERTRALINE HCL 100 MG PO TABS
ORAL_TABLET | Freq: Every day | ORAL | 0 refills | Status: DC
Start: 2020-11-09 — End: 2021-08-18
  Filled 2020-11-09: qty 90, 90d supply, fill #0

## 2020-11-09 MED ORDER — LEVOTHYROXINE SODIUM 112 MCG PO TABS
ORAL_TABLET | Freq: Every day | ORAL | 0 refills | Status: DC
  Filled 2020-11-09: qty 90, 90d supply, fill #0

## 2020-11-09 NOTE — Telephone Encounter (Signed)
LMTCB to schedule a TOC appt  

## 2020-11-09 NOTE — Telephone Encounter (Signed)
Pt had one acute appt with Dr. Milinda Antis on 07/27/20, will route to [facility] pool to see if covering provider wants to fill med and also route to support pool to see if pt wants to schedule a TOC appt with Susy Frizzle, NP

## 2020-12-03 ENCOUNTER — Encounter: Payer: Self-pay | Admitting: General Surgery

## 2020-12-22 ENCOUNTER — Ambulatory Visit: Payer: No Typology Code available for payment source | Admitting: Family Medicine

## 2020-12-24 ENCOUNTER — Other Ambulatory Visit: Payer: Self-pay

## 2020-12-24 ENCOUNTER — Encounter: Payer: Self-pay | Admitting: Family Medicine

## 2020-12-24 ENCOUNTER — Telehealth (INDEPENDENT_AMBULATORY_CARE_PROVIDER_SITE_OTHER): Payer: No Typology Code available for payment source | Admitting: Family Medicine

## 2020-12-24 VITALS — Ht 68.0 in | Wt 275.0 lb

## 2020-12-24 DIAGNOSIS — R140 Abdominal distension (gaseous): Secondary | ICD-10-CM | POA: Diagnosis not present

## 2020-12-24 DIAGNOSIS — K219 Gastro-esophageal reflux disease without esophagitis: Secondary | ICD-10-CM

## 2020-12-24 DIAGNOSIS — E079 Disorder of thyroid, unspecified: Secondary | ICD-10-CM

## 2020-12-24 MED ORDER — FAMOTIDINE 20 MG PO TABS
20.0000 mg | ORAL_TABLET | Freq: Two times a day (BID) | ORAL | 0 refills | Status: DC
  Filled 2020-12-24: qty 180, 90d supply, fill #0

## 2020-12-24 NOTE — Patient Instructions (Signed)
Avoid foods that you know bother you   Drink water and avoid carbonation  Try the pepcid 20 mg bid  The office will call to schedule you for labs at the [facility] location   If symptoms suddenly worsen in the meantime let us know  If severe go to the ER

## 2020-12-24 NOTE — Progress Notes (Signed)
Virtual Visit via Video Note  I connected with Vanessa Adams on 12/24/20 at  9:30 AM EDT by a video enabled telemedicine application and verified that I am speaking with the correct person using two identifiers.  Location: Patient: home Provider: office   I discussed the limitations of evaluation and management by telemedicine and the availability of in person appointments. The patient expressed understanding and agreed to proceed.  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 of NP Vanessa Adams presents with GI complaints /bloating   Wt Readings from Last 3 Encounters:  12/24/20 275 lb (124.7 kg)  07/27/20 274 lb (124.3 kg)  07/22/20 279 lb (126.6 kg)   41.81 kg/m   Noticed it since last ( 4 y)  Abdomen gets bloated and hard  Used to be just with gassy foods  Now with any food at all /or drinking- in the last month   Worst in upper abdomen  No early satiety   No antibiotics   A little bit of diarrhea/loose stool  Bm at same time daily  No blood or black stool   She does not have gallbladder  No abd tenderness    No nausea but she does burp a lot  Does have some heartburn if she eats after 9 pm (so generally does not)  She does intermittent fasting   Some gas , more than she used to have Sometimes wakes up with ST   She eats probiotic yogurt Also a powder blend   No carbonation  No gum  Does drink through a straw    Has mild lactose intolerance -avoids Avoids whole grains -that bother her  She tolerates yogurt and hard cheese Avoids fluid milk   No new meds No change since nexplanon -since July  Used to use seasonale   Last visit gyn was July - normal pelvic exam    Family history  MGM -colon cancer  PGM chron's    Hypothyroidism Hashimoto's  Lab Results  Component Value Date   TSH 3.94 02/04/2020  Occ flares  She has a specialist watching nodule (Dr Celine Ahr  -gen surg)  Patient Active Problem List   Diagnosis Date Noted   Abdominal bloating 12/24/2020   Acid reflux 12/24/2020   Menometrorrhagia 02/23/2020   Dysmenorrhea 02/04/2020   Vitamin D deficiency 02/04/2020   Contraception management 12/26/2017   Anxiety and depression 02/25/2014   Hypertriglyceridemia 11/04/2012   Class 3 severe obesity due to excess calories without serious comorbidity with body mass index (BMI) of 40.0 to 44.9 in adult [facility]) 06/02/2011   Thyroid disease    Past Medical History:  Diagnosis Date   Allergy    Anxiety    Bell palsy    Bell's Palsy 2015   BRCA negative 02/2020   MyRisk neg; IBIS=13.4%/riskscore=7.4%   Family history of ovarian cancer    History of Bell's palsy    affected left side   Thyroid disease    hypothyroidism   Past Surgical History:  Procedure Laterality Date   adenoids     CHOLECYSTECTOMY  2007   TONSILECTOMY/ADENOIDECTOMY WITH MYRINGOTOMY     Social History   Tobacco Use   Smoking status: Former   Smokeless tobacco: Never   Tobacco comments:    quit at age 21  Vaping Use   Vaping Use: Never used  Substance Use Topics   Alcohol use: Yes  Comment: rare   Drug use: No   Family History  Problem Relation Age of Onset   Ovarian cysts Mother        has marker for ovarian cancer   Hyperlipidemia Father    Hypertension Father    Cancer Maternal Grandmother 39       ovarian cancer, colon, rectal   Hypothyroidism Paternal Grandmother    Skin cancer Paternal Grandfather 34   Allergies  Allergen Reactions   Sulfa Antibiotics Rash    stevens-johnsons    Tuberculin Tests Hives   Percocet [Oxycodone-Acetaminophen] Itching   Vicodin [Hydrocodone-Acetaminophen] Itching   Current Outpatient Medications on File Prior to Visit  Medication Sig Dispense Refill   etonogestrel (NEXPLANON) 68 MG IMPL implant 1 each by Subdermal route once.     fluticasone (FLONASE) 50 MCG/ACT nasal spray Place 2 sprays into both nostrils  daily. 16 g 6   hydrOXYzine (ATARAX/VISTARIL) 25 MG tablet Take 0.5-1 tablets (12.5-25 mg total) by mouth every 8 (eight) hours as needed for anxiety. 30 tablet 0   levothyroxine (SYNTHROID) 112 MCG tablet TAKE 1 TABLET (112 MCG TOTAL) BY MOUTH DAILY. 90 tablet 0   sertraline (ZOLOFT) 100 MG tablet TAKE 1 TABLET (100 MG TOTAL) BY MOUTH DAILY. 90 tablet 0   No current facility-administered medications on file prior to visit.   Review of Systems  Constitutional:  Negative for chills, fever and malaise/fatigue.  HENT:  Negative for congestion, ear pain, sinus pain and sore throat.   Eyes:  Negative for blurred vision, discharge and redness.  Respiratory:  Negative for cough, shortness of breath and stridor.   Cardiovascular:  Negative for chest pain, palpitations and leg swelling.  Gastrointestinal:  Positive for diarrhea and heartburn. Negative for abdominal pain, blood in stool, constipation, melena, nausea and vomiting.  Musculoskeletal:  Negative for myalgias.  Skin:  Negative for rash.  Neurological:  Negative for dizziness and headaches.   Observations/Objective: Patient appears well, in no distress Weight is baseline  No facial swelling or asymmetry Normal voice-not hoarse and no slurred speech No obvious tremor or mobility impairment Moving neck and UEs normally Able to hear the call well  Does not note abdominal tenderness during visit  No cough or shortness of breath during interview  Talkative and mentally sharp with no cognitive changes No skin changes on face or neck , no rash or pallor Affect is normal    Assessment and Plan: Problem List Items Addressed This Visit       Digestive   Acid reflux    Worse at night  Burping  Some bloating/unsure if related Px pepcid 20 mg bid  Will update       Relevant Medications   famotidine (PEPCID) 20 MG tablet   Other Relevant Orders   CBC with Differential/Platelet   Basic metabolic panel   Hepatic function panel      Endocrine   Thyroid disease    Due for TSH Clinically stable per pt  Sees surgeon yearly to follow nodule      Relevant Orders   TSH     Other   Abdominal bloating - Primary    Worse in the past several months (but happening for 4 y, prior only worse with gas producing foods) Notes most in upper abdomen Now -triggered by eating or drinking anything, also burping and occ loose stool  Avoids most dairy and does take probiotics  Also notes heartburn in evenings if she eats after 9 pm  Disc avoiding air swallowing and gassy foods  Px pepcid to try 20 mg bid -to see if this helps also with heartburn and belching  Labs ordered to draw at the [facility] location       Relevant Orders   CBC with Differential/Platelet   Basic metabolic panel   Hepatic function panel   Amylase     Follow Up Instructions: Avoid foods that you know bother you   Drink water and avoid carbonation  Try the pepcid 20 mg bid  The office will call to schedule you for labs at the [facility] location   If symptoms suddenly worsen in the meantime let us know  If severe go to the ER    I discussed the assessment and treatment plan with the patient. The patient was provided an opportunity to ask questions and all were answered. The patient agreed with the plan and demonstrated an understanding of the instructions.   The patient was advised to call back or seek an in-person evaluation if the symptoms worsen or if the condition fails to improve as anticipated.     Loura Pardon, MD

## 2020-12-24 NOTE — Assessment & Plan Note (Signed)
Worse at night  Burping  Some bloating/unsure if related Px pepcid 20 mg bid  Will update

## 2020-12-24 NOTE — Assessment & Plan Note (Signed)
Due for TSH Clinically stable per pt  Sees surgeon yearly to follow nodule

## 2020-12-24 NOTE — Assessment & Plan Note (Signed)
Worse in the past several months (but happening for 4 y, prior only worse with gas producing foods) Notes most in upper abdomen Now -triggered by eating or drinking anything, also burping and occ loose stool  Avoids most dairy and does take probiotics  Also notes heartburn in evenings if she eats after 9 pm  Disc avoiding air swallowing and gassy foods  Px pepcid to try 20 mg bid -to see if this helps also with heartburn and belching  Labs ordered to draw at the [facility] location

## 2020-12-29 ENCOUNTER — Encounter: Payer: Self-pay | Admitting: Family Medicine

## 2020-12-29 ENCOUNTER — Telehealth: Payer: Self-pay | Admitting: Family Medicine

## 2020-12-29 NOTE — Telephone Encounter (Signed)
Called patient left vm

## 2020-12-29 NOTE — Telephone Encounter (Signed)
-----   Message from Shon Millet, [place] sent at 12/28/2020  8:18 AM EDT ----- Regarding: lab appt Dr. Milinda Antis did a virtual visit with pt a few days ago, per Dr. Milinda Antis pt needs non fasting labs set up at [facility], please schedule with pt

## 2021-01-05 ENCOUNTER — Other Ambulatory Visit: Payer: No Typology Code available for payment source

## 2021-05-09 ENCOUNTER — Other Ambulatory Visit: Payer: Self-pay

## 2021-05-09 ENCOUNTER — Encounter: Payer: Self-pay | Admitting: Family Medicine

## 2021-05-09 ENCOUNTER — Telehealth (INDEPENDENT_AMBULATORY_CARE_PROVIDER_SITE_OTHER): Payer: No Typology Code available for payment source | Admitting: Family Medicine

## 2021-05-09 VITALS — Temp 98.3°F | Ht 68.0 in

## 2021-05-09 DIAGNOSIS — K219 Gastro-esophageal reflux disease without esophagitis: Secondary | ICD-10-CM | POA: Diagnosis not present

## 2021-05-09 DIAGNOSIS — R109 Unspecified abdominal pain: Secondary | ICD-10-CM | POA: Insufficient documentation

## 2021-05-09 DIAGNOSIS — K5900 Constipation, unspecified: Secondary | ICD-10-CM | POA: Insufficient documentation

## 2021-05-09 DIAGNOSIS — R112 Nausea with vomiting, unspecified: Secondary | ICD-10-CM | POA: Diagnosis not present

## 2021-05-09 DIAGNOSIS — R1013 Epigastric pain: Secondary | ICD-10-CM

## 2021-05-09 NOTE — Assessment & Plan Note (Signed)
With some constipation and some upper abd pain

## 2021-05-09 NOTE — Progress Notes (Signed)
Virtual Visit via Video Note  I connected with Vanessa Adams on 05/09/21 at  4:00 PM EST by a video enabled telemedicine application and verified that I am speaking with the correct person using two identifiers.  Location: Patient: home  Provider: office    I discussed the limitations of evaluation and management by telemedicine and the availability of in person appointments. The patient expressed understanding and agreed to proceed.  Parties involved in encounter  Patient: Vanessa Adams   Provider:  Loura Pardon MD   Video did not work today History of Present Illness: Pt c/o GI symptoms   Constipation  Fleet enema  Suppository  Feels like she is bloated higher up  Rumbly/-able to pass gas and burp   Vomited when she tried to eat   (several hours later- like nothing is digesting)  Feels like she has gallstones but she had ccy in the paste  Some pain and soreness  Pain is high on abdomen  Her abdomen feels tight /bloated  Both sides  Pain was sharp on the first day  Then dull the next day (more painful sporadically)   Was able to finally have a BM  (was 2 d without prior)  (Baseline is 1-2 bms per day) and usually firm  Color is more pale to yellow and more thin   No blood   Eats very healthy    She ate chik filet before this started with fries    IBs and inflammatory bowel dz run in her family   Is hydrating and urinating   No miralax -does not work Wellsite geologist and laxative (smooth move) tea     H/o GERD Was px pepcid this fall -it helped burning and heartburn a lot   Has had ccy in the past   Nexplanon for contraception  Takes zoloft for mood     Patient Active Problem List   Diagnosis Date Noted   Nausea & vomiting 05/09/2021   Constipation 05/09/2021   Abdominal pain 05/09/2021   Abdominal bloating 12/24/2020   Acid reflux 12/24/2020   Menometrorrhagia 02/23/2020   Dysmenorrhea 02/04/2020   Vitamin D deficiency 02/04/2020   Contraception  management 12/26/2017   Anxiety and depression 02/25/2014   Hypertriglyceridemia 11/04/2012   Class 3 severe obesity due to excess calories without serious comorbidity with body mass index (BMI) of 40.0 to 44.9 in adult [facility]) 06/02/2011   Thyroid disease    Past Medical History:  Diagnosis Date   Allergy    Anxiety    Bell palsy    Bell's Palsy 2015   BRCA negative 02/2020   MyRisk neg; IBIS=13.4%/riskscore=7.4%   Family history of ovarian cancer    History of Bell's palsy    affected left side   Thyroid disease    hypothyroidism   Past Surgical History:  Procedure Laterality Date   adenoids     CHOLECYSTECTOMY  2007   TONSILECTOMY/ADENOIDECTOMY WITH MYRINGOTOMY     Social History   Tobacco Use   Smoking status: Former   Smokeless tobacco: Never   Tobacco comments:    quit at age 23  Vaping Use   Vaping Use: Never used  Substance Use Topics   Alcohol use: Yes    Comment: rare   Drug use: No   Family History  Problem Relation Age of Onset   Ovarian cysts Mother        has marker for ovarian cancer   Hyperlipidemia Father    Hypertension Father  Cancer Maternal Grandmother 49       ovarian cancer, colon, rectal   Hypothyroidism Paternal Grandmother    Skin cancer Paternal Grandfather 85   Allergies  Allergen Reactions   Sulfa Antibiotics Rash    stevens-johnsons    Tuberculin Tests Hives   Percocet [Oxycodone-Acetaminophen] Itching   Vicodin [Hydrocodone-Acetaminophen] Itching   Current Outpatient Medications on File Prior to Visit  Medication Sig Dispense Refill   etonogestrel (NEXPLANON) 68 MG IMPL implant 1 each by Subdermal route once.     famotidine (PEPCID) 20 MG tablet Take 1 tablet (20 mg total) by mouth 2 (two) times daily. 180 tablet 0   fluticasone (FLONASE) 50 MCG/ACT nasal spray Place 2 sprays into both nostrils daily. 16 g 6   hydrOXYzine (ATARAX/VISTARIL) 25 MG tablet Take 0.5-1 tablets (12.5-25 mg total) by mouth every 8 (eight) hours as  needed for anxiety. 30 tablet 0   levothyroxine (SYNTHROID) 112 MCG tablet TAKE 1 TABLET (112 MCG TOTAL) BY MOUTH DAILY. 90 tablet 0   sertraline (ZOLOFT) 100 MG tablet TAKE 1 TABLET (100 MG TOTAL) BY MOUTH DAILY. 90 tablet 0   No current facility-administered medications on file prior to visit.   Review of Systems  Constitutional:  Negative for chills, fever and malaise/fatigue.  HENT:  Negative for congestion, ear pain, sinus pain and sore throat.   Eyes:  Negative for blurred vision, discharge and redness.  Respiratory:  Negative for cough, shortness of breath and stridor.   Cardiovascular:  Negative for chest pain, palpitations and leg swelling.  Gastrointestinal:  Positive for abdominal pain, constipation, nausea and vomiting. Negative for blood in stool, diarrhea, heartburn and melena.  Musculoskeletal:  Negative for myalgias.  Skin:  Negative for rash.  Neurological:  Negative for dizziness and headaches.   Observations/Objective: Pt sounds well  Not in distress Good historian/nl cognition  Normal mood -pleasant   Assessment and Plan:  Problem List Items Addressed This Visit       Digestive   Acid reflux    Much better with pepcid      Nausea & vomiting    With some constipation and some upper abd pain          Other   Abdominal pain - Primary    bilat upper abdomen (more intense several days ago bms have slowed, some flatus and abd does gurgle  Disc s/s of bowel obst to watch for  Pt is concerned about CB duct stone since this feels similar to her prior gallstone status before ccy  Labs are still pending from last visit incl cbc, chem and lipase Plan to schedule lab and then in office visit so I can examine  ER precautions rev      Constipation    Pt had no BM for 2 d  Finally small one today using fleet enema and suppository  Recommend miralax, fluids/fiber as tolerated   This is causing bloating  Some upper abd pain and episode of vomiting  (abd is  gurgling and she does pass gas so less worried for bowel obstruction thankfully) Disc what to watch for  ER precautions reviewed F/u in office for visit  Also labs        Follow Up Instructions: Keep hydrated  You can try miralax to add on for constipation   Watch out for signs of bowel obstruction (no gurgling and no gas with continued vomiting)   Keep diet bland/small amounts  The office will call to set up  a visit for examination  Also labs as soon as we can   Keep [place] posted   Any severe symptoms please go to the ER I discussed the assessment and treatment plan with the patient. The patient was provided an opportunity to ask questions and all were answered. The patient agreed with the plan and demonstrated an understanding of the instructions.   The patient was advised to call back or seek an in-person evaluation if the symptoms worsen or if the condition fails to improve as anticipated.  I provided 18 minutes of non-face-to-face time during this encounter.   Loura Pardon, MD

## 2021-05-09 NOTE — Assessment & Plan Note (Signed)
Much better with pepcid

## 2021-05-09 NOTE — Assessment & Plan Note (Signed)
Pt had no BM for 2 d  Finally small one today using fleet enema and suppository  Recommend miralax, fluids/fiber as tolerated   This is causing bloating  Some upper abd pain and episode of vomiting  (abd is gurgling and she does pass gas so less worried for bowel obstruction thankfully) Disc what to watch for  ER precautions reviewed F/u in office for visit  Also labs

## 2021-05-09 NOTE — Patient Instructions (Addendum)
Keep hydrated  You can try miralax to add on for constipation   Watch out for signs of bowel obstruction (no gurgling and no gas with continued vomiting)   Keep diet bland/small amounts  The office will call to set up a visit for examination  Also labs as soon as we can   Keep [place] posted   Any severe symptoms please go to the ER

## 2021-05-09 NOTE — Assessment & Plan Note (Signed)
bilat upper abdomen (more intense several days ago bms have slowed, some flatus and abd does gurgle  Disc s/s of bowel obst to watch for  Pt is concerned about CB duct stone since this feels similar to her prior gallstone status before ccy  Labs are still pending from last visit incl cbc, chem and lipase Plan to schedule lab and then in office visit so I can examine  ER precautions rev

## 2021-05-11 ENCOUNTER — Other Ambulatory Visit (INDEPENDENT_AMBULATORY_CARE_PROVIDER_SITE_OTHER): Payer: No Typology Code available for payment source

## 2021-05-11 ENCOUNTER — Other Ambulatory Visit: Payer: Self-pay

## 2021-05-11 DIAGNOSIS — R140 Abdominal distension (gaseous): Secondary | ICD-10-CM | POA: Diagnosis not present

## 2021-05-11 DIAGNOSIS — K219 Gastro-esophageal reflux disease without esophagitis: Secondary | ICD-10-CM | POA: Diagnosis not present

## 2021-05-11 DIAGNOSIS — E079 Disorder of thyroid, unspecified: Secondary | ICD-10-CM | POA: Diagnosis not present

## 2021-05-11 LAB — HEPATIC FUNCTION PANEL
ALT: 36 U/L — ABNORMAL HIGH (ref 0–35)
AST: 22 U/L (ref 0–37)
Albumin: 4.1 g/dL (ref 3.5–5.2)
Alkaline Phosphatase: 66 U/L (ref 39–117)
Bilirubin, Direct: 0.1 mg/dL (ref 0.0–0.3)
Total Bilirubin: 0.6 mg/dL (ref 0.2–1.2)
Total Protein: 7.3 g/dL (ref 6.0–8.3)

## 2021-05-11 LAB — BASIC METABOLIC PANEL
BUN: 13 mg/dL (ref 6–23)
CO2: 23 mEq/L (ref 19–32)
Calcium: 9.2 mg/dL (ref 8.4–10.5)
Chloride: 105 mEq/L (ref 96–112)
Creatinine, Ser: 0.58 mg/dL (ref 0.40–1.20)
GFR: 115.8 mL/min (ref >60.00)
Glucose, Bld: 105 mg/dL — ABNORMAL HIGH (ref 70–99)
Potassium: 4.4 mEq/L (ref 3.5–5.1)
Sodium: 136 mEq/L (ref 135–145)

## 2021-05-11 LAB — CBC WITH DIFFERENTIAL/PLATELET
Basophils Absolute: 0 10*3/uL (ref 0.0–0.1)
Basophils Relative: 0.3 % (ref 0.0–3.0)
Eosinophils Absolute: 0.1 10*3/uL (ref 0.0–0.7)
Eosinophils Relative: 1.5 % (ref 0.0–5.0)
HCT: 42.7 % (ref 36.0–46.0)
Hemoglobin: 14.4 g/dL (ref 12.0–15.0)
Lymphocytes Relative: 38.8 % (ref 12.0–46.0)
Lymphs Abs: 2.4 10*3/uL (ref 0.7–4.0)
MCHC: 33.7 g/dL (ref 30.0–36.0)
MCV: 97.6 fl (ref 78.0–100.0)
Monocytes Absolute: 0.5 10*3/uL (ref 0.1–1.0)
Monocytes Relative: 7.6 % (ref 3.0–12.0)
Neutro Abs: 3.3 10*3/uL (ref 1.4–7.7)
Neutrophils Relative %: 51.8 % (ref 43.0–77.0)
Platelets: 282 10*3/uL (ref 150.0–400.0)
RBC: 4.38 Mil/uL (ref 3.87–5.11)
RDW: 12.2 % (ref 11.5–15.5)
WBC: 6.3 10*3/uL (ref 4.0–10.5)

## 2021-05-11 LAB — TSH: TSH: 6.58 u[IU]/mL — ABNORMAL HIGH (ref 0.35–5.50)

## 2021-05-11 LAB — AMYLASE: Amylase: 48 U/L (ref 27–131)

## 2021-05-16 ENCOUNTER — Encounter: Payer: Self-pay | Admitting: Family Medicine

## 2021-05-16 ENCOUNTER — Ambulatory Visit (INDEPENDENT_AMBULATORY_CARE_PROVIDER_SITE_OTHER): Payer: No Typology Code available for payment source | Admitting: Family Medicine

## 2021-05-16 ENCOUNTER — Other Ambulatory Visit: Payer: Self-pay

## 2021-05-16 VITALS — BP 130/80 | HR 84 | Temp 98.0°F | Ht 68.0 in | Wt 260.1 lb

## 2021-05-16 DIAGNOSIS — R140 Abdominal distension (gaseous): Secondary | ICD-10-CM

## 2021-05-16 DIAGNOSIS — E079 Disorder of thyroid, unspecified: Secondary | ICD-10-CM | POA: Diagnosis not present

## 2021-05-16 DIAGNOSIS — R1013 Epigastric pain: Secondary | ICD-10-CM | POA: Diagnosis not present

## 2021-05-16 DIAGNOSIS — K5900 Constipation, unspecified: Secondary | ICD-10-CM

## 2021-05-16 MED ORDER — LEVOTHYROXINE SODIUM 125 MCG PO TABS
125.0000 ug | ORAL_TABLET | Freq: Every day | ORAL | 3 refills | Status: DC
  Filled 2021-05-16: qty 90, 90d supply, fill #0
  Filled 2021-09-15: qty 90, 90d supply, fill #1
  Filled 2022-01-11: qty 90, 90d supply, fill #2
  Filled 2022-04-10: qty 90, 90d supply, fill #3

## 2021-05-16 NOTE — Assessment & Plan Note (Signed)
Lab Results  ?Component Value Date  ? TSH 6.58 (H) 05/11/2021  ? ?This is elevated  ?Levothyroxine dose increased to 125 mcg daily  ?Discussed correct way to take it  ?Re check TSH in about 6 weeks  ? ?

## 2021-05-16 NOTE — Progress Notes (Signed)
Subjective:    Patient ID: Vanessa Adams, female    DOB: [birth year], 36 y.o.   MRN: [id number]  This visit occurred during the SARS-CoV-2 public health emergency.  Safety protocols were in place, including screening questions prior to the visit, additional usage of staff PPE, and extensive cleaning of exam room while observing appropriate contact time as indicated for disinfecting solutions.   HPI Pt presents for f/u of abdominal pain and bloating   Wt Readings from Last 3 Encounters:  05/16/21 260 lb 2 oz (118 kg)  12/24/20 275 lb (124.7 kg)  07/27/20 274 lb (124.3 kg)   39.55 kg/m  Last visit we discussed upper abd pain  Also constipation  Felt like before her gb was removed   Stomach is better   Feels a little more puffy in thyroid area  A surgeon follows her thyroid  This happens if she eats too much gluten and occ dairy  May need to change her creamer   Sensitive to some foods  She does intermittent fasting  Eats from 12 to 9 pm     TSH is up  Levothyroxine 112 mcg  Lab Results  Component Value Date   TSH 6.58 (H) 05/11/2021   Takes it before bed   She feels just a little off  Hair does tend to shed Joints are achey  Feels better on the levothy    Suggested miralax  It helped -she is back to normal  More fiber  Is good about fluids    Gerd-pepcid helps   IBS and inflam bd run in family  MGM had colon cancer  Also ovarian cancer   PGM may have chrons and/or IBS   Bloating is improved  When it happens is upper abd both sides     Labs Results for orders placed or performed in visit on 05/11/21  Amylase  Result Value Ref Range   Amylase 48 27 - 131 U/L  Hepatic function panel  Result Value Ref Range   Total Bilirubin 0.6 0.2 - 1.2 mg/dL   Bilirubin, Direct 0.1 0.0 - 0.3 mg/dL   Alkaline Phosphatase 66 39 - 117 U/L   AST 22 0 - 37 U/L   ALT 36 (H) 0 - 35 U/L   Total Protein 7.3 6.0 - 8.3 g/dL   Albumin 4.1 3.5 - 5.2 g/dL  Basic  metabolic panel  Result Value Ref Range   Sodium 136 135 - 145 mEq/L   Potassium 4.4 3.5 - 5.1 mEq/L   Chloride 105 96 - 112 mEq/L   CO2 23 19 - 32 mEq/L   Glucose, Bld 105 (H) 70 - 99 mg/dL   BUN 13 6 - 23 mg/dL   Creatinine, Ser 0.58 0.40 - 1.20 mg/dL   GFR 115.80 >60.00 mL/min   Calcium 9.2 8.4 - 10.5 mg/dL  TSH  Result Value Ref Range   TSH 6.58 (H) 0.35 - 5.50 uIU/mL  CBC with Differential/Platelet  Result Value Ref Range   WBC 6.3 4.0 - 10.5 K/uL   RBC 4.38 3.87 - 5.11 Mil/uL   Hemoglobin 14.4 12.0 - 15.0 g/dL   HCT 42.7 36.0 - 46.0 %   MCV 97.6 78.0 - 100.0 fl   MCHC 33.7 30.0 - 36.0 g/dL   RDW 12.2 11.5 - 15.5 %   Platelets 282.0 150.0 - 400.0 K/uL   Neutrophils Relative % 51.8 43.0 - 77.0 %   Lymphocytes Relative 38.8 12.0 - 46.0 %   Monocytes  Relative 7.6 3.0 - 12.0 %   Eosinophils Relative 1.5 0.0 - 5.0 %   Basophils Relative 0.3 0.0 - 3.0 %   Neutro Abs 3.3 1.4 - 7.7 K/uL   Lymphs Abs 2.4 0.7 - 4.0 K/uL   Monocytes Absolute 0.5 0.1 - 1.0 K/uL   Eosinophils Absolute 0.1 0.0 - 0.7 K/uL   Basophils Absolute 0.0 0.0 - 0.1 K/uL    Patient Active Problem List   Diagnosis Date Noted   Nausea & vomiting 05/09/2021   Constipation 05/09/2021   Abdominal pain 05/09/2021   Abdominal bloating 12/24/2020   Acid reflux 12/24/2020   Menometrorrhagia 02/23/2020   Dysmenorrhea 02/04/2020   Vitamin D deficiency 02/04/2020   Contraception management 12/26/2017   Anxiety and depression 02/25/2014   Hypertriglyceridemia 11/04/2012   Class 3 severe obesity due to excess calories without serious comorbidity with body mass index (BMI) of 40.0 to 44.9 in adult [facility]) 06/02/2011   Thyroid disease    Past Medical History:  Diagnosis Date   Allergy    Anxiety    Bell palsy    Bell's Palsy 2015   BRCA negative 02/2020   MyRisk neg; IBIS=13.4%/riskscore=7.4%   Family history of ovarian cancer    History of Bell's palsy    affected left side   Thyroid disease     hypothyroidism   Past Surgical History:  Procedure Laterality Date   adenoids     CHOLECYSTECTOMY  2007   TONSILECTOMY/ADENOIDECTOMY WITH MYRINGOTOMY     Social History   Tobacco Use   Smoking status: Former   Smokeless tobacco: Never   Tobacco comments:    quit at age 40  Vaping Use   Vaping Use: Never used  Substance Use Topics   Alcohol use: Yes    Comment: rare   Drug use: No   Family History  Problem Relation Age of Onset   Ovarian cysts Mother        has marker for ovarian cancer   Hyperlipidemia Father    Hypertension Father    Cancer Maternal Grandmother 57       ovarian cancer, colon, rectal   Hypothyroidism Paternal Grandmother    Skin cancer Paternal Grandfather 33   Allergies  Allergen Reactions   Sulfa Antibiotics Rash    stevens-johnsons    Tuberculin Tests Hives   Percocet [Oxycodone-Acetaminophen] Itching   Vicodin [Hydrocodone-Acetaminophen] Itching   Current Outpatient Medications on File Prior to Visit  Medication Sig Dispense Refill   etonogestrel (NEXPLANON) 68 MG IMPL implant 1 each by Subdermal route once.     famotidine (PEPCID) 20 MG tablet Take 1 tablet (20 mg total) by mouth 2 (two) times daily. 180 tablet 0   fluticasone (FLONASE) 50 MCG/ACT nasal spray Place 2 sprays into both nostrils daily. 16 g 6   hydrOXYzine (ATARAX/VISTARIL) 25 MG tablet Take 0.5-1 tablets (12.5-25 mg total) by mouth every 8 (eight) hours as needed for anxiety. 30 tablet 0   sertraline (ZOLOFT) 100 MG tablet TAKE 1 TABLET (100 MG TOTAL) BY MOUTH DAILY. 90 tablet 0   No current facility-administered medications on file prior to visit.    Review of Systems  Constitutional:  Negative for activity change, appetite change, fatigue, fever and unexpected weight change.  HENT:  Negative for congestion, ear pain, rhinorrhea, sinus pressure and sore throat.   Eyes:  Negative for pain, redness and visual disturbance.  Respiratory:  Negative for cough, shortness of breath  and wheezing.   Cardiovascular:  Negative for chest pain and palpitations.  Gastrointestinal:  Positive for abdominal distention and constipation. Negative for abdominal pain, anal bleeding, blood in stool, diarrhea, nausea, rectal pain and vomiting.  Endocrine: Negative for polydipsia and polyuria.  Genitourinary:  Negative for dysuria, frequency and urgency.  Musculoskeletal:  Negative for arthralgias, back pain and myalgias.  Skin:  Negative for pallor and rash.  Allergic/Immunologic: Negative for environmental allergies.  Neurological:  Negative for dizziness, syncope and headaches.  Hematological:  Negative for adenopathy. Does not bruise/bleed easily.  Psychiatric/Behavioral:  Negative for decreased concentration and dysphoric mood. The patient is not nervous/anxious.       Objective:   Physical Exam Constitutional:      General: She is not in acute distress.    Appearance: Normal appearance. She is well-developed. She is obese. She is not ill-appearing or diaphoretic.  HENT:     Head: Normocephalic and atraumatic.  Eyes:     Conjunctiva/sclera: Conjunctivae normal.     Pupils: Pupils are equal, round, and reactive to light.  Neck:     Thyroid: No thyromegaly.     Vascular: No carotid bruit or JVD.     Comments: Thyroid is symmetric  Non tender No bruit Cardiovascular:     Rate and Rhythm: Normal rate and regular rhythm.     Heart sounds: Normal heart sounds.    No gallop.  Pulmonary:     Effort: Pulmonary effort is normal. No respiratory distress.     Breath sounds: Normal breath sounds. No wheezing or rales.  Abdominal:     General: Abdomen is protuberant. Bowel sounds are normal. There is no distension or abdominal bruit.     Palpations: Abdomen is soft. There is no fluid wave, hepatomegaly, splenomegaly, mass or pulsatile mass.     Tenderness: There is no abdominal tenderness. There is no right CVA tenderness, left CVA tenderness, guarding or rebound.   Musculoskeletal:     Cervical back: Normal range of motion and neck supple.     Right lower leg: No edema.     Left lower leg: No edema.  Lymphadenopathy:     Cervical: No cervical adenopathy.  Skin:    General: Skin is warm and dry.     Coloration: Skin is not pale.     Findings: No rash.  Neurological:     Mental Status: She is alert.     Coordination: Coordination normal.     Deep Tendon Reflexes: Reflexes are normal and symmetric. Reflexes normal.  Psychiatric:        Mood and Affect: Mood normal.          Assessment & Plan:   Problem List Items Addressed This Visit       Endocrine   Thyroid disease    Lab Results  Component Value Date   TSH 6.58 (H) 05/11/2021  This is elevated  Levothyroxine dose increased to 125 mcg daily  Discussed correct way to take it  Re check TSH in about 6 weeks        Relevant Medications   levothyroxine (SYNTHROID) 125 MCG tablet     Other   Abdominal bloating - Primary    Some improvement with less constipation  fam h/o inflam bd and colon cancer in gm  Referral done for GI consult       Relevant Orders   Ambulatory referral to Gastroenterology   Abdominal pain    This is improved with tx of GERD and with less constipation  Reassuring exam        Relevant Orders   Ambulatory referral to Gastroenterology   Constipation    Much improved with regular miralax and diet change Enc good fluid intake as well as fiber      Relevant Orders   Ambulatory referral to Gastroenterology

## 2021-05-16 NOTE — Assessment & Plan Note (Signed)
Much improved with regular miralax and diet change ?Enc good fluid intake as well as fiber ?

## 2021-05-16 NOTE — Patient Instructions (Addendum)
Go up on levothyroxine to 125 mcg daily  ? ?Let's re check TSH in 6 weeks  ? ?Take care of yourself  ?Continue miralax if needed  ?Drink lots of fluids ?Keep working on diet changes to see what works  ? ?I placed a GI referral to get established  ? ?Give it a week and then call ? ?Due to there being a large influx of referrals the GI offices they have asked that you call their office regarding your referral and scheduling needs. Otherwise, they will reach out to you as soon as they are able. They are working diligently to get patients called and scheduled as soon as possible. ? ?Colonoscopies will be called according to date/time of the referral entry as these are not of urgent need.   ? ? ?[facility] can be considered as well. They may be able to book sooner appointments. You can reach [facility] at [phone].  ? ?In the meantime if symptoms change or worsen let us know  ? ? ? ?

## 2021-05-16 NOTE — Assessment & Plan Note (Signed)
This is improved with tx of GERD and with less constipation  ?Reassuring exam  ? ?

## 2021-05-16 NOTE — Assessment & Plan Note (Signed)
Some improvement with less constipation  ?fam h/o inflam bd and colon cancer in gm  ?Referral done for GI consult  ?

## 2021-05-20 ENCOUNTER — Encounter: Payer: Self-pay | Admitting: *Deleted

## 2021-05-23 ENCOUNTER — Other Ambulatory Visit: Payer: Self-pay

## 2021-05-23 ENCOUNTER — Other Ambulatory Visit: Payer: Self-pay | Admitting: Family Medicine

## 2021-05-23 DIAGNOSIS — F419 Anxiety disorder, unspecified: Secondary | ICD-10-CM

## 2021-05-25 ENCOUNTER — Other Ambulatory Visit: Payer: Self-pay

## 2021-05-25 DIAGNOSIS — F32A Depression, unspecified: Secondary | ICD-10-CM

## 2021-05-25 MED ORDER — HYDROXYZINE HCL 25 MG PO TABS
12.5000 mg | ORAL_TABLET | Freq: Three times a day (TID) | ORAL | 0 refills | Status: DC | PRN
  Filled 2021-05-25: qty 30, 10d supply, fill #0

## 2021-05-26 ENCOUNTER — Other Ambulatory Visit: Payer: Self-pay

## 2021-05-27 ENCOUNTER — Other Ambulatory Visit: Payer: Self-pay

## 2021-05-30 ENCOUNTER — Other Ambulatory Visit: Payer: Self-pay

## 2021-06-26 ENCOUNTER — Telehealth: Payer: Self-pay | Admitting: Family Medicine

## 2021-06-26 DIAGNOSIS — E079 Disorder of thyroid, unspecified: Secondary | ICD-10-CM

## 2021-06-26 NOTE — Telephone Encounter (Signed)
-----   Message from Ronalee Red, RT sent at 06/13/2021  9:56 AM EDT ----- ?Regarding: Lab orders for appt on Monday, 06/27/2021 ?Patient is scheduled for a recheck TSH appt on 06/27/2021.  Need lab orders please. ? ?Thanks, Jae Dire  ? ?

## 2021-06-27 ENCOUNTER — Other Ambulatory Visit: Payer: No Typology Code available for payment source

## 2021-07-04 ENCOUNTER — Other Ambulatory Visit: Payer: No Typology Code available for payment source

## 2021-08-18 ENCOUNTER — Other Ambulatory Visit: Payer: Self-pay

## 2021-08-18 ENCOUNTER — Other Ambulatory Visit: Payer: Self-pay | Admitting: Family

## 2021-08-18 DIAGNOSIS — F419 Anxiety disorder, unspecified: Secondary | ICD-10-CM

## 2021-08-18 DIAGNOSIS — F32A Depression, unspecified: Secondary | ICD-10-CM

## 2021-08-19 ENCOUNTER — Other Ambulatory Visit: Payer: Self-pay

## 2021-08-19 NOTE — Telephone Encounter (Signed)
Is this okay to refill    Last OV  05/16/21  Last filled by Dr Hyman Hopes on 11/09/20

## 2021-08-21 ENCOUNTER — Other Ambulatory Visit: Payer: Self-pay

## 2021-08-21 MED ORDER — SERTRALINE HCL 100 MG PO TABS
ORAL_TABLET | Freq: Every day | ORAL | 3 refills | Status: DC
  Filled 2021-08-21: qty 90, 90d supply, fill #0
  Filled 2021-11-15 – 2022-01-11 (×2): qty 90, 90d supply, fill #1

## 2021-08-22 ENCOUNTER — Other Ambulatory Visit: Payer: Self-pay

## 2021-08-26 ENCOUNTER — Other Ambulatory Visit: Payer: Self-pay

## 2021-08-26 ENCOUNTER — Other Ambulatory Visit: Payer: Self-pay | Admitting: Family Medicine

## 2021-08-29 ENCOUNTER — Other Ambulatory Visit: Payer: Self-pay

## 2021-08-29 MED FILL — Famotidine Tab 20 MG: ORAL | 90 days supply | Qty: 180 | Fill #0 | Status: AC

## 2021-08-31 ENCOUNTER — Other Ambulatory Visit: Payer: Self-pay

## 2021-09-15 ENCOUNTER — Other Ambulatory Visit: Payer: Self-pay

## 2021-09-19 ENCOUNTER — Other Ambulatory Visit: Payer: Self-pay

## 2021-09-22 ENCOUNTER — Other Ambulatory Visit: Payer: Self-pay

## 2021-11-16 ENCOUNTER — Other Ambulatory Visit: Payer: Self-pay

## 2021-11-17 ENCOUNTER — Telehealth (INDEPENDENT_AMBULATORY_CARE_PROVIDER_SITE_OTHER): Payer: No Typology Code available for payment source | Admitting: Family Medicine

## 2021-11-17 ENCOUNTER — Encounter: Payer: Self-pay | Admitting: Family Medicine

## 2021-11-17 VITALS — Temp 98.2°F | Wt 254.0 lb

## 2021-11-17 DIAGNOSIS — Z6838 Body mass index (BMI) 38.0-38.9, adult: Secondary | ICD-10-CM

## 2021-11-17 NOTE — Progress Notes (Signed)
Virtual Visit via Video Note  I connected with Vanessa Adams on 11/17/21 at 10:00 AM EDT by a video enabled telemedicine application and verified that I am speaking with the correct person using two identifiers.  Location: Patient: home  Provider: office    I discussed the limitations of evaluation and management by telemedicine and the availability of in person appointments. The patient expressed understanding and agreed to proceed.  Parties involved in encounter  Patient: Vanessa Adams   Provider:  Loura Pardon MD   History of Present Illness: Wt Readings from Last 3 Encounters:  11/17/21 254 lb (115.2 kg)  05/16/21 260 lb 2 oz (118 kg)  12/24/20 275 lb (124.7 kg)   38.62 kg/m  Feels like she has trouble loosing weight  Has done well then leveled out    Diet : intermittent fasting  -does not eat breakfast   Snack at 8 pm then eats first meal at 1 pm  Got her through a platau   Diet is not bad  No gluten Avoids most dairy  No snacks  Lots of vegetables  Usually 1500-1800 cal per day   Exercise - is up to walking 3 miles  Some rom and balance exercise / PT / gym  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   Was a binge eater in college  Is much better  Has a realistic/nl body image     Weight watchers 4 years ago-point system is hard for her / a lot of work  Never been to wt loss clinic No medicines   Lab Results  Component Value Date   TSH 6.58 (H) 05/11/2021   Has thyroid nodule    Better now  No flares   Patient Active Problem List   Diagnosis Date Noted   Nausea & vomiting 05/09/2021   Constipation 05/09/2021   Abdominal pain 05/09/2021   Abdominal bloating 12/24/2020   Acid reflux 12/24/2020   Menometrorrhagia 02/23/2020   Dysmenorrhea 02/04/2020   Vitamin D deficiency 02/04/2020   Contraception management 12/26/2017   Anxiety and depression 02/25/2014   Hypertriglyceridemia 11/04/2012   BMI 38.0-38.9,adult 06/02/2011   Thyroid  disease    Past Medical History:  Diagnosis Date   Allergy    Anxiety    Bell palsy    Bell's Palsy 2015   BRCA negative 02/2020   MyRisk neg; IBIS=13.4%/riskscore=7.4%   Family history of ovarian cancer    History of Bell's palsy    affected left side   Thyroid disease    hypothyroidism   Past Surgical History:  Procedure Laterality Date   adenoids     CHOLECYSTECTOMY  2007   TONSILECTOMY/ADENOIDECTOMY WITH MYRINGOTOMY     Social History   Tobacco Use   Smoking status: Former   Smokeless tobacco: Never   Tobacco comments:    quit at age 13  Vaping Use   Vaping Use: Never used  Substance Use Topics   Alcohol use: Yes    Comment: rare   Drug use: No   Family History  Problem Relation Age of Onset   Ovarian cysts Mother        has marker for ovarian cancer   Hyperlipidemia Father    Hypertension Father    Cancer Maternal Grandmother 65       ovarian cancer, colon, rectal   Hypothyroidism Paternal Grandmother    Skin cancer Paternal Grandfather 31   Allergies  Allergen Reactions   Sulfa Antibiotics Rash    stevens-johnsons  Tuberculin Tests Hives   Percocet [Oxycodone-Acetaminophen] Itching   Vicodin [Hydrocodone-Acetaminophen] Itching   Current Outpatient Medications on File Prior to Visit  Medication Sig Dispense Refill   etonogestrel (NEXPLANON) 68 MG IMPL implant 1 each by Subdermal route once.     famotidine (PEPCID) 20 MG tablet Take 1 tablet (20 mg total) by mouth 2 (two) times daily. 180 tablet 2   fluticasone (FLONASE) 50 MCG/ACT nasal spray Place 2 sprays into both nostrils daily. (Patient taking differently: Place 2 sprays into both nostrils daily as needed.) 16 g 6   hydrOXYzine (ATARAX) 25 MG tablet Take 0.5-1 tablets (12.5-25 mg total) by mouth every 8 (eight) hours as needed for anxiety. 30 tablet 0   levothyroxine (SYNTHROID) 125 MCG tablet Take 1 tablet (125 mcg total) by mouth daily. 90 tablet 3   sertraline (ZOLOFT) 100 MG tablet TAKE 1  TABLET (100 MG TOTAL) BY MOUTH DAILY. 90 tablet 3   No current facility-administered medications on file prior to visit.   Review of Systems  Constitutional:  Negative for chills, fever and malaise/fatigue.  HENT:  Negative for congestion, ear pain, sinus pain and sore throat.   Eyes:  Negative for blurred vision, discharge and redness.  Respiratory:  Negative for cough, shortness of breath and stridor.   Cardiovascular:  Negative for chest pain, palpitations and leg swelling.  Gastrointestinal:  Negative for abdominal pain, diarrhea, nausea and vomiting.  Musculoskeletal:  Positive for joint pain. Negative for myalgias.  Skin:  Negative for rash.  Neurological:  Negative for dizziness and headaches.  Psychiatric/Behavioral:         History of depression   Fights emotional eating     Observations/Objective: Patient appears well, in no distress Weight is baseline (obese) No facial swelling or asymmetry Normal voice-not hoarse and no slurred speech No obvious tremor or mobility impairment Moving neck and UEs normally Able to hear the call well  No cough or shortness of breath during interview  Talkative and mentally sharp with no cognitive changes No skin changes on face or neck , no rash or pallor Affect is normal    Assessment and Plan: Problem List Items Addressed This Visit       Other   BMI 38.0-38.9,adult - Primary    Pt has lost over 20 lb in the past year with diet and exercise but seems to be at a halt  Encouraged 5 days of exercise per week (cardio and resistance) and her waitress job counts as well  Does well with intermittent fasting from 8 pm to 1 pm   Urged to keep up the good work  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 and she also has h/o thyroid nodules Disc Contrave - given info to look at and as about coverage   Referral made for the healthy weight and wellness clinic as well (unsure how long wait list is)       Relevant Orders   Amb Ref to Medical  Weight Management     Follow Up Instructions: I placed a referral for [facility]  Not sure how long the wait list is   The number should ve  [phone]  Generic semaglutide is expensive and not likely covered by insurance  There is also a thyroid cancer risk with it-may not be the best choice   Take a look at the info on generic contrave - (naltrexone-bupropion)  If insurance covers it let me know  Keep up the great  work with diet and exercise  Try to get most of your carbohydrates from produce (with the exception of white potatoes)  Eat less bread/pasta/rice/snack foods/cereals/sweets and other items from the middle of the grocery store (processed carbs)   I discussed the assessment and treatment plan with the patient. The patient was provided an opportunity to ask questions and all were answered. The patient agreed with the plan and demonstrated an understanding of the instructions.   The patient was advised to call back or seek an in-person evaluation if the symptoms worsen or if the condition fails to improve as anticipated.    Loura Pardon, MD

## 2021-11-17 NOTE — Patient Instructions (Addendum)
I placed a referral for [facility]  Not sure how long the wait list is   The number should ve  [phone]  Generic semaglutide is expensive and not likely covered by insurance  There is also a thyroid cancer risk with it-may not be the best choice   Take a look at the info on generic contrave - (naltrexone-bupropion)  If insurance covers it let me know  Keep up the great work with diet and exercise  Try to get most of your carbohydrates from produce (with the exception of white potatoes)  Eat less bread/pasta/rice/snack foods/cereals/sweets and other items from the middle of the grocery store (processed carbs)

## 2021-11-17 NOTE — Assessment & Plan Note (Signed)
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

## 2021-12-09 ENCOUNTER — Other Ambulatory Visit: Payer: Self-pay

## 2022-01-09 ENCOUNTER — Encounter (INDEPENDENT_AMBULATORY_CARE_PROVIDER_SITE_OTHER): Payer: Self-pay

## 2022-01-11 ENCOUNTER — Other Ambulatory Visit: Payer: Self-pay

## 2022-03-30 ENCOUNTER — Encounter (INDEPENDENT_AMBULATORY_CARE_PROVIDER_SITE_OTHER): Payer: 59 | Admitting: Family Medicine

## 2022-03-30 ENCOUNTER — Encounter (INDEPENDENT_AMBULATORY_CARE_PROVIDER_SITE_OTHER): Payer: Self-pay

## 2022-04-04 ENCOUNTER — Encounter (INDEPENDENT_AMBULATORY_CARE_PROVIDER_SITE_OTHER): Payer: 59 | Admitting: Family Medicine

## 2022-04-10 ENCOUNTER — Other Ambulatory Visit: Payer: Self-pay

## 2022-04-13 ENCOUNTER — Ambulatory Visit (INDEPENDENT_AMBULATORY_CARE_PROVIDER_SITE_OTHER): Payer: 59 | Admitting: Family Medicine

## 2022-04-13 ENCOUNTER — Ambulatory Visit (INDEPENDENT_AMBULATORY_CARE_PROVIDER_SITE_OTHER)
Admission: RE | Admit: 2022-04-13 | Discharge: 2022-04-13 | Disposition: A | Discharge status: Home / Self Care | Payer: 59 | Source: Ambulatory Visit | Attending: Family Medicine | Admitting: Family Medicine

## 2022-04-13 ENCOUNTER — Encounter: Payer: Self-pay | Admitting: Family Medicine

## 2022-04-13 VITALS — BP 144/92 | HR 97 | Temp 97.8°F | Ht 68.0 in | Wt 250.0 lb

## 2022-04-13 DIAGNOSIS — E559 Vitamin D deficiency, unspecified: Secondary | ICD-10-CM

## 2022-04-13 DIAGNOSIS — E079 Disorder of thyroid, unspecified: Secondary | ICD-10-CM | POA: Diagnosis not present

## 2022-04-13 DIAGNOSIS — R0781 Pleurodynia: Secondary | ICD-10-CM | POA: Diagnosis not present

## 2022-04-13 DIAGNOSIS — L659 Nonscarring hair loss, unspecified: Secondary | ICD-10-CM | POA: Insufficient documentation

## 2022-04-13 DIAGNOSIS — R0789 Other chest pain: Secondary | ICD-10-CM | POA: Diagnosis not present

## 2022-04-13 LAB — CBC WITH DIFFERENTIAL/PLATELET
Basophils Absolute: 0 10*3/uL (ref 0.0–0.1)
Basophils Relative: 0.6 % (ref 0.0–3.0)
Eosinophils Absolute: 0.1 10*3/uL (ref 0.0–0.7)
Eosinophils Relative: 1.1 % (ref 0.0–5.0)
HCT: 43.3 % (ref 36.0–46.0)
Hemoglobin: 14.9 g/dL (ref 12.0–15.0)
Lymphocytes Relative: 35.1 % (ref 12.0–46.0)
Lymphs Abs: 2.2 10*3/uL (ref 0.7–4.0)
MCHC: 34.3 g/dL (ref 30.0–36.0)
MCV: 98.1 fl (ref 78.0–100.0)
Monocytes Absolute: 0.5 10*3/uL (ref 0.1–1.0)
Monocytes Relative: 7.5 % (ref 3.0–12.0)
Neutro Abs: 3.5 10*3/uL (ref 1.4–7.7)
Neutrophils Relative %: 55.7 % (ref 43.0–77.0)
Platelets: 296 10*3/uL (ref 150.0–400.0)
RBC: 4.41 Mil/uL (ref 3.87–5.11)
RDW: 12.3 % (ref 11.5–15.5)
WBC: 6.3 10*3/uL (ref 4.0–10.5)

## 2022-04-13 LAB — COMPREHENSIVE METABOLIC PANEL
ALT: 25 U/L (ref 0–35)
AST: 16 U/L (ref 0–37)
Albumin: 4.4 g/dL (ref 3.5–5.2)
Alkaline Phosphatase: 75 U/L (ref 39–117)
BUN: 9 mg/dL (ref 6–23)
CO2: 26 mEq/L (ref 19–32)
Calcium: 9.5 mg/dL (ref 8.4–10.5)
Chloride: 105 mEq/L (ref 96–112)
Creatinine, Ser: 0.53 mg/dL (ref 0.40–1.20)
GFR: 117.58 mL/min (ref >60.00)
Glucose, Bld: 87 mg/dL (ref 70–99)
Potassium: 4.4 mEq/L (ref 3.5–5.1)
Sodium: 138 mEq/L (ref 135–145)
Total Bilirubin: 0.9 mg/dL (ref 0.2–1.2)
Total Protein: 7.9 g/dL (ref 6.0–8.3)

## 2022-04-13 LAB — VITAMIN B12: Vitamin B-12: 289 pg/mL (ref 211–911)

## 2022-04-13 LAB — VITAMIN D 25 HYDROXY (VIT D DEFICIENCY, FRACTURES): VITD: 14.4 ng/mL — ABNORMAL LOW (ref 30.00–100.00)

## 2022-04-13 LAB — TSH: TSH: 2.91 u[IU]/mL (ref 0.35–5.50)

## 2022-04-13 LAB — IRON: Iron: 145 ug/dL (ref 42–145)

## 2022-04-13 LAB — FERRITIN: Ferritin: 191.5 ng/mL (ref 10.0–291.0)

## 2022-04-13 NOTE — Progress Notes (Signed)
Subjective:    Patient ID: Vanessa Adams, female    DOB: [birth year], 37 y.o.   MRN: [id number]  HPI Pt presents with c/o hair loss Also rib pain   Wt Readings from Last 3 Encounters:  04/13/22 250 lb (113.4 kg)  11/17/21 254 lb (115.2 kg)  05/16/21 260 lb 2 oz (118 kg)   38.01 kg/m  Vitals:   04/13/22 1002  BP: (!) 144/92  Pulse: 97  Temp: 97.8 F (36.6 C)  SpO2: 97%   Ribs on R lateral chest hurt  No trauma May have pulled a muscle stretching  (had quick sharp pain)  Sharp in nature   Hurts to cough or take a very deep breath    DG Ribs Unilateral Right  Result Date: 04/13/2022 CLINICAL DATA:  Pain EXAM: RIGHT RIBS and chest - 3 VIEW COMPARISON:  None Available. FINDINGS: No fracture or other bone lesions are seen involving the ribs. Unremarkable cardiac silhouette. Normal pulmonary vasculature. No pneumothorax or pleural effusion. Lungs are clear. IMPRESSION: 1. No acute cardiopulmonary process. 2. No right rib abnormality. Electronically Signed   By: Sammie Bench M.D.   On: 04/13/2022 10:57     Her hair loss is all over recently  When she runs her hands through- lots of hair  In brush and sink   No bald spots  No particular areas   Working out at home -less in general  Just signed up to use hospital gym- hours are challenging    Intentional weight loss : 30 lb overall   H/o hashimoto's Lab Results  Component Value Date   TSH 6.58 (H) 05/11/2021   Levothyroxine was inc to 125 mcg    Vitamins : taking biotin  Usually takes a mvi  Fairly balanced diet    Has to eat gluten free    Takes pepcid   Stress level : very up   Has nexplanon    H/o heavy menses-no periods right low  Lab Results  Component Value Date   WBC 6.3 05/11/2021   HGB 14.4 05/11/2021   HCT 42.7 05/11/2021   MCV 97.6 05/11/2021   PLT 282.0 05/11/2021   More anxious  Some mild panic episodes lately   Patient Active Problem List   Diagnosis Date Noted   Chest  wall pain 04/13/2022   Hair loss 04/13/2022   Nausea & vomiting 05/09/2021   Constipation 05/09/2021   Abdominal pain 05/09/2021   Abdominal bloating 12/24/2020   Acid reflux 12/24/2020   Menometrorrhagia 02/23/2020   Dysmenorrhea 02/04/2020   Vitamin D deficiency 02/04/2020   Contraception management 12/26/2017   Anxiety and depression 02/25/2014   Hypertriglyceridemia 11/04/2012   BMI 38.0-38.9,adult 06/02/2011   Thyroid disease    Past Medical History:  Diagnosis Date   Allergy    Anxiety    Bell palsy    Bell's Palsy 2015   BRCA negative 02/2020   MyRisk neg; IBIS=13.4%/riskscore=7.4%   Family history of ovarian cancer    History of Bell's palsy    affected left side   Thyroid disease    hypothyroidism   Past Surgical History:  Procedure Laterality Date   adenoids     CHOLECYSTECTOMY  2007   TONSILECTOMY/ADENOIDECTOMY WITH MYRINGOTOMY     Social History   Tobacco Use   Smoking status: Former   Smokeless tobacco: Never   Tobacco comments:    quit at age 61  Vaping Use   Vaping Use: Never used  Substance Use  Topics   Alcohol use: Yes    Comment: rare   Drug use: No   Family History  Problem Relation Age of Onset   Ovarian cysts Mother        has marker for ovarian cancer   Hyperlipidemia Father    Hypertension Father    Cancer Maternal Grandmother 38       ovarian cancer, colon, rectal   Hypothyroidism Paternal Grandmother    Skin cancer Paternal Grandfather 50   Allergies  Allergen Reactions   Sulfa Antibiotics Rash    stevens-johnsons    Tuberculin Tests Hives   Percocet [Oxycodone-Acetaminophen] Itching   Vicodin [Hydrocodone-Acetaminophen] Itching   Current Outpatient Medications on File Prior to Visit  Medication Sig Dispense Refill   etonogestrel (NEXPLANON) 68 MG IMPL implant 1 each by Subdermal route once.     famotidine (PEPCID) 20 MG tablet Take 1 tablet (20 mg total) by mouth 2 (two) times daily. 180 tablet 2   hydrOXYzine (ATARAX)  25 MG tablet Take 0.5-1 tablets (12.5-25 mg total) by mouth every 8 (eight) hours as needed for anxiety. 30 tablet 0   levothyroxine (SYNTHROID) 125 MCG tablet Take 1 tablet (125 mcg total) by mouth daily. 90 tablet 3   sertraline (ZOLOFT) 100 MG tablet TAKE 1 TABLET (100 MG TOTAL) BY MOUTH DAILY. 90 tablet 3   No current facility-administered medications on file prior to visit.    Review of Systems  Constitutional:  Positive for fatigue. Negative for activity change, appetite change, fever and unexpected weight change.  HENT:  Negative for congestion, ear pain, rhinorrhea, sinus pressure and sore throat.   Eyes:  Negative for pain, redness and visual disturbance.  Respiratory:  Negative for cough, shortness of breath and wheezing.   Cardiovascular:  Negative for chest pain and palpitations.  Gastrointestinal:  Negative for abdominal pain, blood in stool, constipation and diarrhea.  Endocrine: Negative for polydipsia and polyuria.  Genitourinary:  Negative for dysuria, frequency and urgency.  Musculoskeletal:  Negative for arthralgias, back pain and myalgias.       Chest wall pain  Skin:  Negative for pallor and rash.       Hair loss  Allergic/Immunologic: Negative for environmental allergies.  Neurological:  Negative for dizziness, syncope and headaches.  Hematological:  Negative for adenopathy. Does not bruise/bleed easily.  Psychiatric/Behavioral:  Positive for sleep disturbance. Negative for decreased concentration and dysphoric mood. The patient is nervous/anxious.        Stressors   More anxiety Occ panic symptoms       Objective:   Physical Exam Constitutional:      General: She is not in acute distress.    Appearance: Normal appearance. She is well-developed. She is obese. She is not ill-appearing or diaphoretic.  HENT:     Head: Normocephalic and atraumatic.     Mouth/Throat:     Mouth: Mucous membranes are moist.  Eyes:     General: No scleral icterus.     Conjunctiva/sclera: Conjunctivae normal.     Pupils: Pupils are equal, round, and reactive to light.  Neck:     Thyroid: No thyromegaly.     Vascular: No carotid bruit or JVD.  Cardiovascular:     Rate and Rhythm: Regular rhythm. Tachycardia present.     Heart sounds: Normal heart sounds.     No gallop.  Pulmonary:     Effort: Pulmonary effort is normal. No respiratory distress.     Breath sounds: Normal breath sounds. No  stridor. No wheezing, rhonchi or rales.     Comments: Tender R lateral ribs /lower  No crepitus or step off No bruising or skin change    Good air exch Chest:     Chest wall: Tenderness present.  Abdominal:     General: There is no distension or abdominal bruit.     Palpations: Abdomen is soft. There is no mass.     Tenderness: There is no abdominal tenderness.  Musculoskeletal:     Cervical back: Normal range of motion and neck supple.     Right lower leg: No edema.     Left lower leg: No edema.  Lymphadenopathy:     Cervical: No cervical adenopathy.  Skin:    General: Skin is warm and dry.     Coloration: Skin is not pale.     Findings: No bruising, erythema or rash.     Comments: No focal hair loss  Follicles appear healthy   No change in scalp condition  No rashes  Neurological:     Mental Status: She is alert.     Cranial Nerves: No cranial nerve deficit.     Motor: No weakness.     Coordination: Coordination normal.     Deep Tendon Reflexes: Reflexes are normal and symmetric. Reflexes normal.  Psychiatric:        Mood and Affect: Mood normal.     Comments: Candidly discusses symptoms and stressors             Assessment & Plan:   Problem List Items Addressed This Visit       Endocrine   Thyroid disease    TSH today  Pt did not return after last dose change Taking levothy 125 mcg daily   Some hair loss recently      Relevant Orders   TSH     Other   Chest wall pain    Happened after stretching R lateral ribs Pain with  movement and deep breath  Reassuring exam  No fx on xray  Inst to use warm compress  Otc nsaid Take supported breaths frequently  Update if not starting to improve in a week or if worsening  Watch for cough or s/s of pna        Relevant Orders   DG Ribs Unilateral Right (Completed)   Hair loss - Primary    Diffuse - in sink/brush/hands No focal areas  May be multi factorial  Did have levothyroxine dose change  Also inc stress Also intentional wt loss of about 30 lb   Labs ordered incl tsh, anemia labs, chem , B12 and D Pending result        Relevant Orders   TSH   Comprehensive metabolic panel   Ferritin   Iron   CBC with Differential/Platelet   Vitamin B12   Vitamin D deficiency    D level added to labs      Relevant Orders   VITAMIN D 25 Hydroxy (Vit-D Deficiency, Fractures)

## 2022-04-13 NOTE — Assessment & Plan Note (Signed)
D level added to labs  

## 2022-04-13 NOTE — Assessment & Plan Note (Signed)
Happened after stretching R lateral ribs Pain with movement and deep breath  Reassuring exam  No fx on xray  Inst to use warm compress  Otc nsaid Take supported breaths frequently  Update if not starting to improve in a week or if worsening  Watch for cough or s/s of pna

## 2022-04-13 NOTE — Assessment & Plan Note (Signed)
Diffuse - in sink/brush/hands No focal areas  May be multi factorial  Did have levothyroxine dose change  Also inc stress Also intentional wt loss of about 30 lb   Labs ordered incl tsh, anemia labs, chem , B12 and D Pending result

## 2022-04-13 NOTE — Assessment & Plan Note (Signed)
TSH today  Pt did not return after last dose change Taking levothy 125 mcg daily   Some hair loss recently

## 2022-04-13 NOTE — Patient Instructions (Addendum)
Take care of yourself   Labs today for hair loss   Eat a balance diet Think about dialing back caffeine a bit   Xray for ribs  Use heat on ribs  Hold a pillow and take a deep breath every 30 minutes   Ibuprofen or naproxen are good to try (always take with food) as directed for pain

## 2022-05-01 ENCOUNTER — Other Ambulatory Visit: Payer: Self-pay

## 2022-05-01 MED FILL — Famotidine Tab 20 MG: ORAL | 90 days supply | Qty: 180 | Fill #1 | Status: AC

## 2022-06-05 DIAGNOSIS — Z0289 Encounter for other administrative examinations: Secondary | ICD-10-CM

## 2022-06-07 ENCOUNTER — Encounter (INDEPENDENT_AMBULATORY_CARE_PROVIDER_SITE_OTHER): Payer: 59 | Admitting: Family Medicine

## 2022-06-14 ENCOUNTER — Telehealth: Payer: Self-pay | Admitting: Family Medicine

## 2022-06-14 ENCOUNTER — Encounter (INDEPENDENT_AMBULATORY_CARE_PROVIDER_SITE_OTHER): Payer: Self-pay | Admitting: Family Medicine

## 2022-06-14 ENCOUNTER — Ambulatory Visit (INDEPENDENT_AMBULATORY_CARE_PROVIDER_SITE_OTHER): Payer: 59 | Admitting: Family Medicine

## 2022-06-14 VITALS — BP 155/91 | HR 89 | Temp 98.4°F | Ht 68.0 in | Wt 254.0 lb

## 2022-06-14 DIAGNOSIS — F39 Unspecified mood [affective] disorder: Secondary | ICD-10-CM | POA: Insufficient documentation

## 2022-06-14 DIAGNOSIS — Z6838 Body mass index (BMI) 38.0-38.9, adult: Secondary | ICD-10-CM

## 2022-06-14 DIAGNOSIS — R5383 Other fatigue: Secondary | ICD-10-CM

## 2022-06-14 DIAGNOSIS — E559 Vitamin D deficiency, unspecified: Secondary | ICD-10-CM | POA: Diagnosis not present

## 2022-06-14 DIAGNOSIS — K219 Gastro-esophageal reflux disease without esophagitis: Secondary | ICD-10-CM | POA: Diagnosis not present

## 2022-06-14 DIAGNOSIS — R0602 Shortness of breath: Secondary | ICD-10-CM

## 2022-06-14 DIAGNOSIS — E063 Autoimmune thyroiditis: Secondary | ICD-10-CM | POA: Diagnosis not present

## 2022-06-14 DIAGNOSIS — E038 Other specified hypothyroidism: Secondary | ICD-10-CM | POA: Diagnosis not present

## 2022-06-14 DIAGNOSIS — Z1331 Encounter for screening for depression: Secondary | ICD-10-CM | POA: Diagnosis not present

## 2022-06-14 NOTE — Telephone Encounter (Signed)
Patient would like to know if she could come in to discuss lab results from today(06/14/2022),[facility] [address]? She stated that the experience there is so horrible that she is not going to go back,not even to discuss the results,and would like to discuss them with Tower instead?Please advise. I did advise her that the results weren't back yet,so I Didn't  want to make her appointment just yet.She said she just wanted to make sure Dr Glori Bickers was just willing to discuss them with her first?

## 2022-06-14 NOTE — Progress Notes (Signed)
Vanessa Adams, D.O.  ABFM, ABOM Specializing in Clinical Bariatric Medicine Office located at: [address]  [place], [place]  [id number]     Initial Bariatric Medicine Consultation Visit  Dear Vanessa Adams, Vanessa Silverio B, FNP    Thank you for referring Vanessa Adams to our clinic today for evaluation.  We performed a consultation to discuss her options for treatment and educate the patient on her disease state.  The following note includes my evaluation and treatment recommendations.   Please do not hesitate to reach out to me directly if you have any further concerns.   Labs on 04/13/22 with Dr.Towers  Assessment and Plan:   Orders Placed This Encounter  Procedures   Hemoglobin A1c   Insulin, random   Lipid Panel With LDL/HDL Ratio   EKG 12-Lead    Medications Discontinued During This Encounter  Medication Reason   hydrOXYzine (ATARAX) 25 MG tablet Completed Course   sertraline (ZOLOFT) 100 MG tablet Completed Course     No orders of the defined types were placed in this encounter.   1) Fatigue Assessment: Condition is Not optimized. Vanessa Adams does feel that her weight is causing her energy to be lower than it should be. Fatigue may be related to obesity, depression or many other causes. she does not appear to have any red flag symptoms and this appears to most likely related to her current lifestyle habits and dietary intake.   Plan:  EKG was not performed due to patient request. Labs will be ordered and reviewed with her at their next office visit in two weeks In the meanwhile, Vanessa Adams will focus on self care including making healthy food choices by following their meal plan, improving sleep quality and focusing on stress reduction.  Once we are assured she is on an appropriate meal plan, we will start discussing exercise to increase cardiovascular fitness levels.    2) Shortness of breath on exertion Assessment: Condition is Not optimized. Vanessa Adams does feel  that she gets out of breath more easily than she used to when she exercises and seems to be worsening over time with weight gain. Vanessa Adams's shortness of breath appears to be obesity related and exercise induced, as they do not appear to have any "red flag" symptoms/ concerns today.  Also, this condition appears to be related to a state of poor cardiovascular conditioning   Plan: Check labs.  Indirect Calorimeter completed today shows a VO2 of 290 and a REE of 2002.  Her calculated basal metabolic rate is [id number] thus her measured basal metabolic rate is slightly worse than expected.  Patient agreed to work on weight loss at this time.  As Vanessa Adams progresses through our weight loss program, we will gradually increase exercise as tolerated to treat her current condition.   If Vanessa Adams follows our recommendations and loses 5-10% of their weight without improvement of her shortness of breath or if at any time, symptoms become more concerning, they agree to urgently follow up with their PCP/ specialist for further consideration/ evaluation.   Vanessa Adams verbalizes agreement with this plan.    Hypothyroidism due to Hashimoto's thyroiditis Assessment: Condition is stable. She has been compliant with Synthroid 125 mcg daily. Denies any side effects. She sees Endocrinology once a year, however her PCP mostly manages this condition.    Plan:Check labs. Continue with med as recommended by Dr.Tower.   Vitamin D deficiency Assessment: Condition is Not optimized. Lab Results  Component Value Date   VD25OH 14.40 (L)  04/13/2022   VD25OH 21.76 (L) 02/04/2020   VD25OH 18.11 (L) 05/22/2019  Patient endorses that she takes multivitamins for the Vitamin D deficiency.  Plan: Continue with multivitamins.  - weight loss will likely improve availability of vitamin D, thus encouraged Vanessa Adams to continue with meal plan and their weight loss efforts to further improve this condition.  Thus, we will need to monitor  levels regularly (every 3-4 mo on average) to keep levels within normal limits and prevent over supplementation.   Gastroesophageal reflux disease, unspecified whether esophagitis present Assessment: Condition is stable.  Patient is compliant with Pepcid 20 mg BID. Denies any side effects  Plan:Continue with Pepcid 20 mg BID as per Dr.Tower.    Mood disorder-Emotional Eating Assessment: Condition is stable.  Patient endorses that her mood is under control. She occasionally eats to stay awake and comfort herself. She checks in with her psychologist every 6 months.   Plan: I informed her about Dr. Dewaine Adams, our Bariatric Psychologist, if she has any emotional eating problems in the future. She declined seeing Dr.Barker for now, but may in the future.    TREATMENT PLAN FOR OBESITY: Morbid obesity-start bmi 38.6/date 06/14/22  Assessment: Condition is Not optimized.. Biometric data collected today, was reviewed with patient.  Fat mass is 111.6 lbs.  Muscle mass is 135.2 lbs. Total body water is 90.2 lbs. She is gluten free and lactose intolerant.   Plan: Vanessa Adams will begin  eating 1400-1500 calories daily with  115+ grams of protein daily. She will use the Category 2 meal plan as a guide.  Discussed with patient gluten free options  including sesame cracker, sweet potatoes, etc.   Behavioral Intervention Additional resources provided today: category 2 meal plan information and protein content of foods, Evidence-based interventions for health behavior change were utilized today including the discussion of self monitoring techniques, problem-solving barriers and SMART goal setting techniques.   Regarding patient's less desirable eating habits and patterns, we employed the technique of small changes.  Pt will specifically work on: consuming 1400-1500 calories daily with  115+ grams of protein  She will use the Category 2 meal plan as a guide.  for next visit.    Recommended Physical Activity  Goals Vanessa Adams has been advised to work up to 150 minutes of moderate intensity aerobic activity a week and strengthening exercises 2-3 times per week for cardiovascular health, weight loss maintenance and preservation of muscle mass.  She has agreed to continue their current level of activity  FOLLOW UP: Follow up in 2 weeks. She was informed of the importance of frequent follow up visits to maximize her success with intensive lifestyle modifications for her multiple health conditions.  Vanessa Adams is aware that we will review all of her lab results at our next visit.  She is aware that if anything is critical/ life threatening with the results, we will be contacting her via MyChart prior to the office visit to discuss management.     Chief Complaint:   OBESITY Vanessa Adams (MR# [id number]) is a 37 y.o. female who presents for evaluation and treatment of obesity and related comorbidities. Current BMI is Body mass index is 38.62 kg/m. Vanessa Adams has been struggling with her weight for many years and has been unsuccessful in either losing weight, maintaining weight loss, or reaching her healthy weight goal.  Vanessa Adams is currently in the action stage of change and ready to dedicate time achieving and maintaining a healthier weight. Vanessa Adams is interested  in becoming our patient and working on intensive lifestyle modifications including (but not limited to) diet and exercise for weight loss.  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   Patient is married to [place]  and has 2 children (67 y/o daughter and 74 y/o son). She lives with her husband and children.  She exercises 3 times a week, 45 minutes- 2hrs. She does yoga, weight training, and cardio.  She has never been on any diet/healthy eating plan in the past.   She snacks frequently, especially at night after dinner.   She does intermittent fasting and skips breakfast often as  per her PCP. Her PCP has her on a 1600-1700 calorie diet plan.   Patient identifies worst food habit as not eating throughout the day and eating dinner and snacks at night.   She recently had labs with Dr.Tower, her PCP.   Depression Screen Her Food and Mood (modified PHQ-9) score was 1.  Sleep habits Her ESS score is 5.  Fraidy admits to waking up still tired. Lucillia generally gets  6-8  hours of sleep per night, and states that she has generally restful sleep. Snoring is not present. Apneic episodes is not present.    Subjective:   This is the patient's first visit at [facility].  The patient's NEW PATIENT PACKET that they filled out prior to today's office visit was reviewed at length and information from that paperwork was included within the following office visit note.    Included in the packet: current and past health history, medications, allergies, ROS, gynecologic history (women only), surgical history, family history, social history, weight history, weight loss surgery history (for those that have had weight loss surgery), nutritional evaluation, mood and food questionnaire along with a depression screening (PHQ9) on all patients, an Epworth questionnaire, sleep habits questionnaire, patient life and health improvement goals questionnaire. These will all be scanned into the patient's chart under the "media" tab.   Review of Systems: Please refer to new patient packet scanned into media. Pertinent positives were addressed with patient today.   Objective:   PHYSICAL EXAM: Blood pressure (!) 155/91, pulse 89, temperature 98.4 F (36.9 C), height 5\' 8"  (1.727 m), weight 254 lb (115.2 kg), SpO2 99 %. Body mass index is 38.62 kg/m. General: Well Developed, well nourished, and in no acute distress.  HEENT: Normocephalic, atraumatic Skin: Warm and dry, cap RF less 2 sec, good turgor Chest:  Normal excursion, shape, no gross abn Respiratory: speaking in full  sentences, no conversational dyspnea NeuroM-Sk: Ambulates w/o assistance, moves * 4 Psych: A and O *3, insight good, mood-full   Anthropometric Measurements Height: 5\' 8"  (1.727 m) Weight: 254 lb (115.2 kg) BMI (Calculated): 38.63 Starting Weight: 254lb Peak Weight: 279lb Waist Measurement : 44 inches   Body Composition  Body Fat %: 43.9 % Fat Mass (lbs): 111.6 lbs Muscle Mass (lbs): 135.2 lbs Total Body Water (lbs): 90.2 lbs Visceral Fat Rating : 11   Other Clinical Data RMR: 2002 Fasting: yes Labs: yes Today's Visit #: 1 Starting Date: 06/14/22    DIAGNOSTIC DATA REVIEWED:  BMET    Component Value Date/Time   NA 138 04/13/2022 1054   K 4.4 04/13/2022 1054   CL 105 04/13/2022 1054   CO2 26 04/13/2022 1054   GLUCOSE 87 04/13/2022 1054   BUN 9 04/13/2022 1054   CREATININE 0.53 04/13/2022 1054   CALCIUM 9.5 04/13/2022 1054  GFRNONAA >60 02/14/2017 1335   GFRAA >60 02/14/2017 1335   No results found for: "HGBA1C" No results found for: "INSULIN" Lab Results  Component Value Date   TSH 2.91 04/13/2022   CBC    Component Value Date/Time   WBC 6.3 04/13/2022 1054   RBC 4.41 04/13/2022 1054   HGB 14.9 04/13/2022 1054   HCT 43.3 04/13/2022 1054   PLT 296.0 04/13/2022 1054   MCV 98.1 04/13/2022 1054   MCH 32.6 02/16/2017 0510   MCHC 34.3 04/13/2022 1054   RDW 12.3 04/13/2022 1054   Iron Studies    Component Value Date/Time   IRON 145 04/13/2022 1054   FERRITIN 191.5 04/13/2022 1054   Lipid Panel     Component Value Date/Time   CHOL 123 05/22/2019 0910   TRIG 167.0 (H) 05/22/2019 0910   HDL 34.70 (L) 05/22/2019 0910   CHOLHDL 4 05/22/2019 0910   VLDL 33.4 05/22/2019 0910   LDLCALC 55 05/22/2019 0910   LDLDIRECT 70.0 06/03/2015 0847   Hepatic Function Panel     Component Value Date/Time   PROT 7.9 04/13/2022 1054   ALBUMIN 4.4 04/13/2022 1054   AST 16 04/13/2022 1054   ALT 25 04/13/2022 1054   ALKPHOS 75 04/13/2022 1054   BILITOT 0.9  04/13/2022 1054   BILIDIR 0.1 05/11/2021 0916      Component Value Date/Time   TSH 2.91 04/13/2022 1054   Nutritional Lab Results  Component Value Date   VD25OH 14.40 (L) 04/13/2022   VD25OH 21.76 (L) 02/04/2020   VD25OH 18.11 (L) 05/22/2019    Attestation Statements:   Reviewed by clinician on day of visit: allergies, medications, problem list, medical history, surgical history, family history, social history, and previous encounter notes.  During the visit, I independently reviewed the patient's EKG, bioimpedance scale results, and indirect calorimeter results. I used this information to tailor a meal plan for the patient that will help Vanessa Seen to lose weight and will improve her obesity-related conditions going forward.  I performed a medically necessary appropriate examination and/or evaluation. I discussed the assessment and treatment plan with the patient. The patient was provided an opportunity to ask questions and all were answered. The patient agreed with the plan and demonstrated an understanding of the instructions. Labs were ordered today (unless patient declined them) and will be reviewed with the patient at our next visit unless more critical results need to be addressed immediately. Clinical information was updated and documented in the EMR.  Time spent on visit including pre-visit chart review and post-visit care was estimated to be 55 minutes.    I,Special Puri,acting as a Neurosurgeon for [employer], DO.,have documented all relevant documentation on the behalf of Thomasene Lot, DO,as directed by  Thomasene Lot, DO while in the presence of Thomasene Lot, DO.   I, Thomasene Lot, DO, have reviewed all documentation for this visit. The documentation on 06/14/22 for the exam, diagnosis, procedures, and orders are all accurate and complete.

## 2022-06-14 NOTE — Telephone Encounter (Signed)
That is fine  Have her schedule an appt and we will discuss results during that appt

## 2022-06-14 NOTE — Telephone Encounter (Signed)
Patient scheduled for next week 06/20/2022.I let her know that her results should be back by then.

## 2022-06-15 LAB — LIPID PANEL WITH LDL/HDL RATIO
Cholesterol, Total: 147 mg/dL (ref 100–199)
HDL: 41 mg/dL (ref >39)
LDL Chol Calc (NIH): 84 mg/dL (ref 0–99)
LDL/HDL Ratio: 2 ratio (ref 0.0–3.2)
Triglycerides: 122 mg/dL (ref 0–149)
VLDL Cholesterol Cal: 22 mg/dL (ref 5–40)

## 2022-06-15 LAB — HEMOGLOBIN A1C
Est. average glucose Bld gHb Est-mCnc: 114 mg/dL
Hgb A1c MFr Bld: 5.6 % (ref 4.8–5.6)

## 2022-06-15 LAB — INSULIN, RANDOM: INSULIN: 19 u[IU]/mL (ref 2.6–24.9)

## 2022-06-20 ENCOUNTER — Ambulatory Visit: Payer: 59 | Admitting: Family Medicine

## 2022-06-22 ENCOUNTER — Ambulatory Visit (INDEPENDENT_AMBULATORY_CARE_PROVIDER_SITE_OTHER): Payer: 59 | Admitting: Family Medicine

## 2022-06-22 VITALS — BP 128/84 | HR 82 | Temp 98.0°F | Ht 68.0 in | Wt 259.1 lb

## 2022-06-22 DIAGNOSIS — E063 Autoimmune thyroiditis: Secondary | ICD-10-CM

## 2022-06-22 DIAGNOSIS — E559 Vitamin D deficiency, unspecified: Secondary | ICD-10-CM

## 2022-06-22 DIAGNOSIS — E038 Other specified hypothyroidism: Secondary | ICD-10-CM

## 2022-06-22 DIAGNOSIS — F419 Anxiety disorder, unspecified: Secondary | ICD-10-CM | POA: Diagnosis not present

## 2022-06-22 DIAGNOSIS — E781 Pure hyperglyceridemia: Secondary | ICD-10-CM

## 2022-06-22 DIAGNOSIS — F32A Depression, unspecified: Secondary | ICD-10-CM | POA: Diagnosis not present

## 2022-06-22 DIAGNOSIS — L659 Nonscarring hair loss, unspecified: Secondary | ICD-10-CM | POA: Diagnosis not present

## 2022-06-22 DIAGNOSIS — R7303 Prediabetes: Secondary | ICD-10-CM

## 2022-06-22 NOTE — Assessment & Plan Note (Signed)
Last vitamin D Lab Results  Component Value Date   VD25OH 14.40 (L) 04/13/2022   Disc imp to overall and bone health   Will continue mvi daily  Add 2000 iu D3 to that  Continue to follow

## 2022-06-22 NOTE — Progress Notes (Signed)
Subjective:    Patient ID: Vanessa Adams Seen, female    DOB: 1985/12/30, 37 y.o.   MRN: [id number]  HPI Pt presents to discuss lab results  Fatigue and exercise intolerance  Blood sugar Low D Low normal  B12 Hypothyroid   Wt Readings from Last 3 Encounters:  06/22/22 259 lb 2 oz (117.5 kg)  06/14/22 254 lb (115.2 kg)  04/13/22 250 lb (113.4 kg)   39.40 kg/m  Vitals:   06/22/22 0956  BP: 128/84  Pulse: 82  Temp: 98 F (36.7 C)  SpO2: 98%     [facility]  (she [facility] not have a good experience)  Does not want to go back  Calculated basal metabolic rate was worse than expected (but better than pt throught)    Is building muscle  Tennis  Adding weights to her yoga  Some resistance bands  Can use gym at the hospital  Walking with the kids more also     Aiming for 1400-1500 cal daily with protein  She uses an app   Goal to have decrease in sob on exertion with 5-10% wt loos    Fatigue- is better than it was   6-8 h sleep per night  No snoring    Had insulin level of 19 on April 3 Lab Results  Component Value Date   HGBA1C 5.6 06/14/2022      Last metabolic panel Lab Results  Component Value Date   GLUCOSE 87 04/13/2022   NA 138 04/13/2022   K 4.4 04/13/2022   CL 105 04/13/2022   CO2 26 04/13/2022   BUN 9 04/13/2022   CREATININE 0.53 04/13/2022   GFRNONAA >60 02/14/2017   CALCIUM 9.5 04/13/2022   PROT 7.9 04/13/2022   ALBUMIN 4.4 04/13/2022   BILITOT 0.9 04/13/2022   ALKPHOS 75 04/13/2022   AST 16 04/13/2022   ALT 25 04/13/2022   ANIONGAP 8 02/14/2017   GFR 117  Lab Results  Component Value Date   WBC 6.3 04/13/2022   HGB 14.9 04/13/2022   HCT 43.3 04/13/2022   MCV 98.1 04/13/2022   PLT 296.0 04/13/2022   Lab Results  Component Value Date   IRON 145 04/13/2022   FERRITIN 191.5 04/13/2022   Lab Results  Component Value Date   VITAMINB12 289 04/13/2022   Taking multi vitamin   Thyroid Lab  Results  Component Value Date   TSH 2.91 04/13/2022   Levothyroxine 125 mcg daily   H/o hair loss Better with multi vitamin  Adding cafir   Vit D Last vitamin D Lab Results  Component Value Date   VD25OH 14.40 (L) 04/13/2022  Takes mvi   Cholesterol Lab Results  Component Value Date   CHOL 147 06/14/2022   HDL 41 06/14/2022   LDLCALC 84 06/14/2022   LDLDIRECT 70.0 06/03/2015   TRIG 122 06/14/2022   CHOLHDL 4 05/22/2019   Anx/dep = doing better  Playing tennis with her daughter and enjoys it  Practices serving and returning  Some pickle ball  Elbow got a little sore- using ice    Patient Active Problem List   Diagnosis Date Noted   Mood disorder-Emotional Eating 06/14/2022   Morbid obesity-start bmi 38.6/date 06/14/22 06/14/2022   Hair loss 04/13/2022   Constipation 05/09/2021   Abdominal bloating 12/24/2020   Acid reflux 12/24/2020   Menometrorrhagia 02/23/2020   Dysmenorrhea 02/04/2020   Vitamin D deficiency 02/04/2020   Contraception management 12/26/2017  Anxiety and depression 02/25/2014   Hypertriglyceridemia 11/04/2012   Hypothyroidism    Past Medical History:  Diagnosis Date   Allergy    Anxiety    Autism    Back pain    Bell palsy    Bell's Palsy 2015   BRCA negative 02/2020   MyRisk neg; IBIS=13.4%/riskscore=7.4%   Carpal tunnel syndrome    Constipation, acute    Family history of ovarian cancer    Gallbladder disorder    GERD (gastroesophageal reflux disease)    Hashimoto's disease    History of Bell's palsy    affected left side   Hypoglycemia    Rheumatoid arthritis    Thyroid disease    hypothyroidism   Vitamin D deficiency    Past Surgical History:  Procedure Laterality Date   adenoids     CHOLECYSTECTOMY  2007   TONSILECTOMY/ADENOIDECTOMY WITH MYRINGOTOMY     Social History   Tobacco Use   Smoking status: Former   Smokeless tobacco: Never   Tobacco comments:    quit at age 60  Vaping Use   Vaping Use: Never used   Substance Use Topics   Alcohol use: Yes    Comment: rare   Drug use: No   Family History  Problem Relation Age of Onset   Ovarian cysts Mother        has marker for ovarian cancer   Anxiety disorder Mother    Obesity Mother    Hyperlipidemia Father    Hypertension Father    Diabetes Father    Cancer Maternal Grandmother 91       ovarian cancer, colon, rectal   Hypothyroidism Paternal Grandmother    Skin cancer Paternal Grandfather 34   Allergies  Allergen Reactions   Sulfa Antibiotics Rash    stevens-johnsons    Tuberculin Tests Hives   Percocet [Oxycodone-Acetaminophen] Itching   Vicodin [Hydrocodone-Acetaminophen] Itching   Current Outpatient Medications on File Prior to Visit  Medication Sig Dispense Refill   cholecalciferol (VITAMIN D3) 25 MCG (1000 UNIT) tablet Take 2,000 Units by mouth daily.     etonogestrel (NEXPLANON) 68 MG IMPL implant 1 each by Subdermal route once.     famotidine (PEPCID) 20 MG tablet Take 1 tablet (20 mg total) by mouth 2 (two) times daily. 180 tablet 2   levothyroxine (SYNTHROID) 125 MCG tablet Take 1 tablet (125 mcg total) by mouth daily. 90 tablet 3   Multiple Vitamin (MULTIVITAMIN) capsule Take 1 capsule by mouth daily.     No current facility-administered medications on file prior to visit.    Review of Systems  Constitutional:  Positive for fatigue. Negative for activity change, appetite change, fever and unexpected weight change.       Fatigue is improved   HENT:  Negative for congestion, ear pain, rhinorrhea, sinus pressure and sore throat.   Eyes:  Negative for pain, redness and visual disturbance.  Respiratory:  Negative for cough, shortness of breath and wheezing.   Cardiovascular:  Negative for chest pain and palpitations.  Gastrointestinal:  Negative for abdominal pain, blood in stool, constipation and diarrhea.  Endocrine: Negative for polydipsia and polyuria.  Genitourinary:  Negative for dysuria, frequency and urgency.   Musculoskeletal:  Negative for arthralgias, back pain and myalgias.  Skin:  Negative for pallor and rash.  Allergic/Immunologic: Negative for environmental allergies.  Neurological:  Negative for dizziness, syncope and headaches.  Hematological:  Negative for adenopathy. Does not bruise/bleed easily.  Psychiatric/Behavioral:  Negative for decreased concentration and  dysphoric mood. The patient is not nervous/anxious.        Objective:   Physical Exam Constitutional:      General: She is not in acute distress.    Appearance: Normal appearance. She is well-developed. She is obese. She is not ill-appearing or diaphoretic.  HENT:     Head: Normocephalic and atraumatic.  Eyes:     Conjunctiva/sclera: Conjunctivae normal.     Pupils: Pupils are equal, round, and reactive to light.  Neck:     Thyroid: No thyromegaly.     Vascular: No carotid bruit or JVD.  Cardiovascular:     Rate and Rhythm: Normal rate and regular rhythm.     Heart sounds: Normal heart sounds.     No gallop.  Pulmonary:     Effort: Pulmonary effort is normal. No respiratory distress.     Breath sounds: Normal breath sounds. No wheezing or rales.  Abdominal:     General: There is no distension or abdominal bruit.     Palpations: Abdomen is soft.  Musculoskeletal:     Cervical back: Normal range of motion and neck supple.     Right lower leg: No edema.     Left lower leg: No edema.  Lymphadenopathy:     Cervical: No cervical adenopathy.  Skin:    General: Skin is warm and dry.     Coloration: Skin is not pale.     Findings: No rash.  Neurological:     Mental Status: She is alert.     Cranial Nerves: No cranial nerve deficit.     Coordination: Coordination normal.     Deep Tendon Reflexes: Reflexes are normal and symmetric. Reflexes normal.  Psychiatric:        Mood and Affect: Mood normal.           Assessment & Plan:   Problem List Items Addressed This Visit       Endocrine   Hypothyroidism     Hypothyroidism  Pt has no clinical changes No change in energy level/ hair or skin/ edema and no tremor  (fatigue is improved) Lab Results  Component Value Date   TSH 2.91 04/13/2022    Plans to continue levothyroxine 125 mcg daily          Other   Anxiety and depression    Seems to be doing better Motivated and working on self care      Hair loss    This is improved  Working on self care  Taking mvi  Enc more D and vit b12  Tsh is therapeutic Nl iron   Continue to follow       Hypertriglyceridemia    Disc goals for lipids and reasons to control them Rev last labs with pt Rev low sat fat diet in detail Improved Trig of 122  Enc her to keep up the good work      Morbid obesity-start bmi 38.6/date 06/14/22    Pt [facility] Has a good plan on her own  Disc plan for low glycemic /high protein diet and exercise (incl strength training)  Expect fatigue and sob will improve more with wt loss  No s/s of OSA Reviewed labs from the clinic including insulin - they are reassuring  Will start on some more vit D and B12   Co morbidity is prediabetes/elevated glucose       Prediabetes - Primary    Lab Results  Component Value Date   HGBA1C 5.6 06/14/2022  disc imp of low glycemic diet and wt loss to prevent DM2  Insulin level in nl range       Vitamin D deficiency    Last vitamin D Lab Results  Component Value Date   VD25OH 14.40 (L) 04/13/2022  Disc imp to overall and bone health   Will continue mvi daily  Add 2000 iu D3 to that  Continue to follow

## 2022-06-22 NOTE — Assessment & Plan Note (Signed)
Lab Results  Component Value Date   HGBA1C 5.6 06/14/2022   disc imp of low glycemic diet and wt loss to prevent DM2  Insulin level in nl range

## 2022-06-22 NOTE — Assessment & Plan Note (Signed)
Seems to be doing better Motivated and working on self care

## 2022-06-22 NOTE — Assessment & Plan Note (Signed)
This is improved  Working on self care  Taking mvi  Enc more D and vit b12  Tsh is therapeutic Nl iron   Continue to follow

## 2022-06-22 NOTE — Assessment & Plan Note (Signed)
Hypothyroidism  Pt has no clinical changes No change in energy level/ hair or skin/ edema and no tremor  (fatigue is improved) Lab Results  Component Value Date   TSH 2.91 04/13/2022    Plans to continue levothyroxine 125 mcg daily

## 2022-06-22 NOTE — Assessment & Plan Note (Deleted)
Pt has [facility] Has a good plan on her own  Disc plan for low glycemic /high protein diet and exercise (incl strength training)  Expect fatigue and sob will improve more with wt loss  No s/s of OSA Reviewed labs from the clinic including insulin - they are reassuring  Will start on some more vit D and B12

## 2022-06-22 NOTE — Patient Instructions (Addendum)
Make sure your multi vitamin has at least 500 mcg daily of B12   Continue the mvi  Add extra 2000 iu of vitamin D3 every day   We want to get HDL (good cholesterol) up  Fish (not shellfish) is helpful Also exercise   Continue generally low glycemic diet  Strength train   Labs look good

## 2022-06-22 NOTE — Assessment & Plan Note (Addendum)
Pt has decided not to [facility] Has a good plan on her own  Disc plan for low glycemic /high protein diet and exercise (incl strength training)  Expect fatigue and sob will improve more with wt loss  No s/s of OSA Reviewed labs from the clinic including insulin - they are reassuring  Will start on some more vit D and B12   Co morbidity is prediabetes/elevated glucose

## 2022-06-22 NOTE — Assessment & Plan Note (Signed)
Disc goals for lipids and reasons to control them Rev last labs with pt Rev low sat fat diet in detail Improved Trig of 122  Enc her to keep up the good work

## 2022-06-28 ENCOUNTER — Ambulatory Visit (INDEPENDENT_AMBULATORY_CARE_PROVIDER_SITE_OTHER): Payer: 59 | Admitting: Family Medicine

## 2022-07-10 ENCOUNTER — Other Ambulatory Visit: Payer: Self-pay | Admitting: Family Medicine

## 2022-07-10 ENCOUNTER — Other Ambulatory Visit: Payer: Self-pay

## 2022-07-10 DIAGNOSIS — E079 Disorder of thyroid, unspecified: Secondary | ICD-10-CM

## 2022-07-11 ENCOUNTER — Other Ambulatory Visit: Payer: Self-pay

## 2022-07-11 MED FILL — Levothyroxine Sodium Tab 125 MCG: ORAL | 90 days supply | Qty: 90 | Fill #0 | Status: AC

## 2022-07-12 ENCOUNTER — Encounter: Payer: Self-pay | Admitting: Family Medicine

## 2022-07-12 ENCOUNTER — Ambulatory Visit (INDEPENDENT_AMBULATORY_CARE_PROVIDER_SITE_OTHER): Payer: 59 | Admitting: Family Medicine

## 2022-07-12 VITALS — BP 118/62 | HR 81 | Temp 98.0°F | Ht 68.0 in | Wt 248.4 lb

## 2022-07-12 DIAGNOSIS — T192XXA Foreign body in vulva and vagina, initial encounter: Secondary | ICD-10-CM | POA: Diagnosis not present

## 2022-07-12 DIAGNOSIS — W448XXA Other foreign body entering into or through a natural orifice, initial encounter: Secondary | ICD-10-CM | POA: Insufficient documentation

## 2022-07-12 NOTE — Progress Notes (Signed)
Subjective:    Patient ID: Vanessa Adams Seen, female    DOB: December 04, 1985, 37 y.o.   MRN: [id number]  HPI Pt presents for concern of retained tampon   Wt Readings from Last 3 Encounters:  07/12/22 248 lb 6 oz (112.7 kg)  06/22/22 259 lb 2 oz (117.5 kg)  06/14/22 254 lb (115.2 kg)   37.77 kg/m  Vitals:   07/12/22 1223  BP: 118/62  Pulse: 81  Temp: 98 F (36.7 C)  SpO2: 97%    Put it in yesterday  Forgot about it  Has not taken it out  Was spotting   Some cramping  No discharge or odor   She has nexplanon for contraception  Sometimes spots with this   Patient Active Problem List   Diagnosis Date Noted   Retained tampon 07/12/2022   Prediabetes 06/22/2022   Mood disorder-Emotional Eating 06/14/2022   Morbid obesity-start bmi 38.6/date 06/14/22 06/14/2022   Hair loss 04/13/2022   Constipation 05/09/2021   Abdominal bloating 12/24/2020   Acid reflux 12/24/2020   Menometrorrhagia 02/23/2020   Dysmenorrhea 02/04/2020   Vitamin D deficiency 02/04/2020   Contraception management 12/26/2017   Anxiety and depression 02/25/2014   Hypertriglyceridemia 11/04/2012   Hypothyroidism    Past Medical History:  Diagnosis Date   Allergy    Anxiety    Autism    Back pain    Bell palsy    Bell's Palsy 2015   BRCA negative 02/2020   MyRisk neg; IBIS=13.4%/riskscore=7.4%   Carpal tunnel syndrome    Constipation, acute    Family history of ovarian cancer    Gallbladder disorder    GERD (gastroesophageal reflux disease)    Hashimoto's disease    History of Bell's palsy    affected left side   Hypoglycemia    Rheumatoid arthritis (HCC)    Thyroid disease    hypothyroidism   Vitamin D deficiency    Past Surgical History:  Procedure Laterality Date   adenoids     CHOLECYSTECTOMY  2007   TONSILECTOMY/ADENOIDECTOMY WITH MYRINGOTOMY     Social History   Tobacco Use   Smoking status: Former   Smokeless tobacco: Never   Tobacco comments:    quit at age 50  Vaping  Use   Vaping Use: Never used  Substance Use Topics   Alcohol use: Yes    Comment: rare   Drug use: No   Family History  Problem Relation Age of Onset   Ovarian cysts Mother        has marker for ovarian cancer   Anxiety disorder Mother    Obesity Mother    Hyperlipidemia Father    Hypertension Father    Diabetes Father    Cancer Maternal Grandmother 26       ovarian cancer, colon, rectal   Hypothyroidism Paternal Grandmother    Skin cancer Paternal Grandfather 8   Allergies  Allergen Reactions   Sulfa Antibiotics Rash    stevens-johnsons    Tuberculin Tests Hives   Percocet [Oxycodone-Acetaminophen] Itching   Vicodin [Hydrocodone-Acetaminophen] Itching   Current Outpatient Medications on File Prior to Visit  Medication Sig Dispense Refill   cholecalciferol (VITAMIN D3) 25 MCG (1000 UNIT) tablet Take 2,000 Units by mouth daily.     etonogestrel (NEXPLANON) 68 MG IMPL implant 1 each by Subdermal route once.     famotidine (PEPCID) 20 MG tablet Take 1 tablet (20 mg total) by mouth 2 (two) times daily. 180 tablet 2  levothyroxine (SYNTHROID) 125 MCG tablet Take 1 tablet (125 mcg total) by mouth daily before breakfast. 90 tablet 2   Multiple Vitamin (MULTIVITAMIN) capsule Take 1 capsule by mouth daily.     No current facility-administered medications on file prior to visit.     Review of Systems  Genitourinary:  Positive for pelvic pain and vaginal pain. Negative for difficulty urinating, dysuria, flank pain, frequency, menstrual problem and vaginal discharge.       Objective:   Physical Exam Exam conducted with a chaperone present.  Constitutional:      General: She is not in acute distress.    Appearance: Normal appearance. She is obese. She is not ill-appearing.  Eyes:     Conjunctiva/sclera: Conjunctivae normal.  Cardiovascular:     Rate and Rhythm: Normal rate and regular rhythm.  Pulmonary:     Effort: Pulmonary effort is normal. No respiratory distress.   Abdominal:     General: There is no distension.     Palpations: Abdomen is soft.     Tenderness: There is no abdominal tenderness.  Genitourinary:    Exam position: Lithotomy position.     Pubic Area: No rash.      Labia:        Right: No rash, tenderness or lesion.        Left: No rash, tenderness or lesion.      Vagina: No vaginal discharge, erythema or tenderness.     Comments: Retained tampon noted deep in vagina  After consent obtained this was removed in total with ring forceps Pt tolerated well and felt relief afterwards  No other abn seen    Skin:    Coloration: Skin is not pale.     Findings: No erythema or rash.  Neurological:     Mental Status: She is alert.  Psychiatric:        Mood and Affect: Mood normal.           Assessment & Plan:   Problem List Items Addressed This Visit       Genitourinary   Retained tampon - Primary    This had been in one day with light bleeding /spotting  She could not find/grasp strings with fingers to remove herself and c/o some pelvic and vaginal pressure and cramping   This was removed with ring forceps in total  Pt tolerated well and noted significant relief No vaginal d/c or injury noted  Discussed ss of infection or injury to watch for / fever or pain  F/u prn

## 2022-07-12 NOTE — Assessment & Plan Note (Signed)
This had been in one day with light bleeding /spotting  She could not find/grasp strings with fingers to remove herself and c/o some pelvic and vaginal pressure and cramping   This was removed with ring forceps in total  Pt tolerated well and noted significant relief No vaginal d/c or injury noted  Discussed ss of infection or injury to watch for / fever or pain  F/u prn

## 2022-07-12 NOTE — Patient Instructions (Signed)
Let us know if you continue to have cramping or discomfort  Or if fever or you feel sick in any way

## 2022-07-14 ENCOUNTER — Other Ambulatory Visit: Payer: Self-pay

## 2022-10-02 ENCOUNTER — Encounter: Payer: Self-pay | Admitting: Family Medicine

## 2022-10-02 ENCOUNTER — Telehealth (INDEPENDENT_AMBULATORY_CARE_PROVIDER_SITE_OTHER): Payer: 59 | Admitting: Family Medicine

## 2022-10-02 VITALS — Temp 98.1°F | Ht 68.0 in | Wt 236.3 lb

## 2022-10-02 DIAGNOSIS — E669 Obesity, unspecified: Secondary | ICD-10-CM

## 2022-10-02 NOTE — Progress Notes (Signed)
Virtual Visit via Video Note  I connected with Vanessa Adams on 10/02/22 at  9:30 AM EDT by a video enabled telemedicine application and verified that I am speaking with the correct person using two identifiers.  Location: Patient: home Provider: office    I discussed the limitations of evaluation and management by telemedicine and the availability of in person appointments. The patient expressed understanding and agreed to proceed.  Parties involved in encounter  Patient: Vanessa Adams   Provider:  Roxy Manns MD   History of Present Illness: Pt presents for c/o obesity   Wt Readings from Last 3 Encounters:  10/02/22 236 lb 5.3 oz (107.2 kg)  07/12/22 248 lb 6 oz (112.7 kg)  06/22/22 259 lb 2 oz (117.5 kg)   35.93 kg/m  Now doing more strength training and cardio  Her appetite is up after the exercise (after dinner)  Tries to distract herself   Not her stomach, it is her brain   Hitting her macros  Getting her protein   Uses an app and that is helpful  115 grams of protein at least   Getting stronger  Did an intouch scan - added over 1 lb of muscle   On work days with no gym- she stops eating by 7-8  Gym days- tries to stop at 8 and then so hungry/ needs to eat   Protein powder- 20 g for 90 calories with carb before work outs  This keeps blood sugar level   Work outs can last 2 hours   500 cal for bkfast and lunch  Has plenty of calories for dinner   500-600 calories  (protein / lot of fish lately, chicken , veggies and then rice or potato Is full after dinner   1400 calories is what she aims for     per chart has history of emotional eating  Right now appetite is more of a problem   Does have episodic depression worsened by stress  Went to [facility] -that did not work out   Most recent labs from there and here  Lab Results  Component Value Date   NA 138 04/13/2022   K 4.4 04/13/2022   CO2 26 04/13/2022   GLUCOSE 87 04/13/2022    BUN 9 04/13/2022   CREATININE 0.53 04/13/2022   CALCIUM 9.5 04/13/2022   GFR 117.58 04/13/2022   GFRNONAA >60 02/14/2017   Lab Results  Component Value Date   ALT 25 04/13/2022   AST 16 04/13/2022   ALKPHOS 75 04/13/2022   BILITOT 0.9 04/13/2022   Lab Results  Component Value Date   WBC 6.3 04/13/2022   HGB 14.9 04/13/2022   HCT 43.3 04/13/2022   MCV 98.1 04/13/2022   PLT 296.0 04/13/2022   Lab Results  Component Value Date   IRON 145 04/13/2022   FERRITIN 191.5 04/13/2022   Last vitamin D Lab Results  Component Value Date   VD25OH 14.40 (L) 04/13/2022   Lab Results  Component Value Date   VITAMINB12 289 04/13/2022   Lab Results  Component Value Date   HGBA1C 5.6 06/14/2022   Lab Results  Component Value Date   CHOL 147 06/14/2022   HDL 41 06/14/2022   LDLCALC 84 06/14/2022   LDLDIRECT 70.0 06/03/2015   TRIG 122 06/14/2022   CHOLHDL 4 05/22/2019    Lab Results  Component Value Date   TSH 2.91 04/13/2022   History of hashimotos   No thyroid replacement now  Has nexplanon for contraception   Patient Active Problem List   Diagnosis Date Noted   Obesity (BMI 30-39.9) 10/02/2022   Retained tampon 07/12/2022   Prediabetes 06/22/2022   Mood disorder-Emotional Eating 06/14/2022   Hair loss 04/13/2022   Constipation 05/09/2021   Abdominal bloating 12/24/2020   Acid reflux 12/24/2020   Menometrorrhagia 02/23/2020   Dysmenorrhea 02/04/2020   Vitamin D deficiency 02/04/2020   Contraception management 12/26/2017   Anxiety and depression 02/25/2014   Hypertriglyceridemia 11/04/2012   Hypothyroidism    Past Medical History:  Diagnosis Date   Allergy    Anxiety    Autism    Back pain    Bell palsy    Bell's Palsy 2015   BRCA negative 02/2020   MyRisk neg; IBIS=13.4%/riskscore=7.4%   Carpal tunnel syndrome    Constipation, acute    Family history of ovarian cancer    Gallbladder disorder    GERD (gastroesophageal reflux disease)     Hashimoto's disease    History of Bell's palsy    affected left side   Hypoglycemia    Rheumatoid arthritis (HCC)    Thyroid disease    hypothyroidism   Vitamin D deficiency    Past Surgical History:  Procedure Laterality Date   adenoids     CHOLECYSTECTOMY  2007   TONSILECTOMY/ADENOIDECTOMY WITH MYRINGOTOMY     Social History   Tobacco Use   Smoking status: Former   Smokeless tobacco: Never   Tobacco comments:    quit at age 89  Vaping Use   Vaping status: Never Used  Substance Use Topics   Alcohol use: Yes    Comment: rare   Drug use: No   Family History  Problem Relation Age of Onset   Ovarian cysts Mother        has marker for ovarian cancer   Anxiety disorder Mother    Obesity Mother    Hyperlipidemia Father    Hypertension Father    Diabetes Father    Cancer Maternal Grandmother 49       ovarian cancer, colon, rectal   Hypothyroidism Paternal Grandmother    Skin cancer Paternal Grandfather 58   Allergies  Allergen Reactions   Sulfa Antibiotics Rash    stevens-johnsons    Tuberculin Tests Hives   Percocet [Oxycodone-Acetaminophen] Itching   Vicodin [Hydrocodone-Acetaminophen] Itching   Current Outpatient Medications on File Prior to Visit  Medication Sig Dispense Refill   cholecalciferol (VITAMIN D3) 25 MCG (1000 UNIT) tablet Take 2,000 Units by mouth daily.     etonogestrel (NEXPLANON) 68 MG IMPL implant 1 each by Subdermal route once.     famotidine (PEPCID) 20 MG tablet Take 1 tablet (20 mg total) by mouth 2 (two) times daily. 180 tablet 2   levothyroxine (SYNTHROID) 125 MCG tablet Take 1 tablet (125 mcg total) by mouth daily before breakfast. 90 tablet 2   Multiple Vitamin (MULTIVITAMIN) capsule Take 1 capsule by mouth daily.     No current facility-administered medications on file prior to visit.   Review of Systems  Constitutional:  Negative for chills, fever and malaise/fatigue.  HENT:  Negative for congestion, ear pain, sinus pain and sore  throat.   Eyes:  Negative for blurred vision, discharge and redness.  Respiratory:  Negative for cough, shortness of breath and stridor.   Cardiovascular:  Negative for chest pain, palpitations and leg swelling.  Gastrointestinal:  Negative for abdominal pain, diarrhea, nausea and vomiting.       History of  heartburn if she eats late   Musculoskeletal:  Negative for myalgias.  Skin:  Negative for rash.  Neurological:  Negative for dizziness and headaches.     Observations/Objective: Patient appears well, in no distress Weight is baseline  No facial swelling or asymmetry Normal voice-not hoarse and no slurred speech No obvious tremor or mobility impairment Moving neck and UEs normally Able to hear the call well  No cough or shortness of breath during interview  Talkative and mentally sharp with no cognitive changes No skin changes on face or neck , no rash or pallor Affect is normal    Assessment and Plan: Problem List Items Addressed This Visit       Other   Obesity (BMI 30-39.9) - Primary    Bmi of 35.9 Is making progress with 1400 cal diet / 115 g protein and exercise with cardio and weight training (classes/ really likes it)  Good diet choices   Having hunger in evening (not emotional) on her work out days  Interested in appetite suppression for those days   Discussed options depending on what is covered by her insurance  GLP - will not be covered (may in future) Contrave (naltrexone-bupropion)  Bupropion alone (also has history of depression)  Topamax -discussed possible side effects of mood change/brain fog/tingling Would not recommend phentermine due to side effects /habit Will check with ins and get back to [place] re: affordability   Encouraged to keep up the great work so far and self care          Follow Up Instructions: The medicines for appetite control we discussed today include   GLP drugs (like ozempic)= not likely covered by insurance by may be in  the future   Contrave (naltrexone- bupropion)  Bupropion alone (also for depression)   Topamax (this is a seizure drug used to treat headaches and it does cut appetite)= but can cause some brain fog and mood change and tingling   Keep up the great work with diet and exercise   Let us know what may be covered   I discussed the assessment and treatment plan with the patient. The patient was provided an opportunity to ask questions and all were answered. The patient agreed with the plan and demonstrated an understanding of the instructions.   The patient was advised to call back or seek an in-person evaluation if the symptoms worsen or if the condition fails to improve as anticipated.     Roxy Manns, MD

## 2022-10-02 NOTE — Patient Instructions (Signed)
The medicines for appetite control we discussed today include   GLP drugs (like ozempic)= not likely covered by insurance by may be in the future   Contrave (naltrexone- bupropion)  Bupropion alone (also for depression)   Topamax (this is a seizure drug used to treat headaches and it does cut appetite)= but can cause some brain fog and mood change and tingling   Keep up the great work with diet and exercise   Let us know what may be covered

## 2022-10-02 NOTE — Assessment & Plan Note (Signed)
Bmi of 35.9 Is making progress with 1400 cal diet / 115 g protein and exercise with cardio and weight training (classes/ really likes it)  Good diet choices   Having hunger in evening (not emotional) on her work out days  Interested in appetite suppression for those days   Discussed options depending on what is covered by her insurance  GLP - will not be covered (may in future) Contrave (naltrexone-bupropion)  Bupropion alone (also has history of depression)  Topamax -discussed possible side effects of mood change/brain fog/tingling Would not recommend phentermine due to side effects /habit Will check with ins and get back to [place] re: affordability   Encouraged to keep up the great work so far and self care

## 2022-10-03 ENCOUNTER — Telehealth: Payer: Self-pay | Admitting: Family Medicine

## 2022-10-03 MED ORDER — NALTREXONE-BUPROPION HCL ER 8-90 MG PO TB12
ORAL_TABLET | ORAL | 3 refills | Status: DC
  Filled 2022-10-03 – 2022-10-11 (×2): qty 70, 30d supply, fill #0
  Filled 2022-11-15: qty 90, 30d supply, fill #1

## 2022-10-03 NOTE — Telephone Encounter (Signed)
Sent Please alert [place] if any side effects or problems

## 2022-10-03 NOTE — Telephone Encounter (Signed)
Patient called in to lett Dr Milinda Antis know that medication [facility] would be covered under her insurance for her to receive as a appetite suppressant. She just needs the script sent in.  [facility] Phone: [phone]  Fax: [phone]

## 2022-10-04 ENCOUNTER — Other Ambulatory Visit: Payer: Self-pay

## 2022-10-04 ENCOUNTER — Encounter: Payer: Self-pay | Admitting: Pharmacist

## 2022-10-04 NOTE — Telephone Encounter (Signed)
Pt notified Rx sent to pharmacy and advised of PCP's comments

## 2022-10-04 NOTE — Telephone Encounter (Signed)
Pt let me know the Naltrexone-bupropion will need a PA, will route to the PA dpt

## 2022-10-05 ENCOUNTER — Other Ambulatory Visit (HOSPITAL_COMMUNITY): Payer: Self-pay

## 2022-10-05 NOTE — Telephone Encounter (Signed)
PA request has been Submitted. New Encounter created for follow up.

## 2022-10-10 ENCOUNTER — Telehealth: Payer: Self-pay

## 2022-10-10 ENCOUNTER — Other Ambulatory Visit (HOSPITAL_COMMUNITY): Payer: Self-pay

## 2022-10-10 NOTE — Telephone Encounter (Signed)
Pharmacy Patient Advocate Encounter  Received notification from [facility] that Prior Authorization for Contrave 8-90MG  er tablets has been APPROVED from 10/06/22 to 11/05/22. Ran test claim, Copay is $24.98  PA #/Case ID/Reference #: [id number]

## 2022-10-11 ENCOUNTER — Other Ambulatory Visit: Payer: Self-pay

## 2022-11-10 ENCOUNTER — Other Ambulatory Visit: Payer: Self-pay | Admitting: Family Medicine

## 2022-11-10 ENCOUNTER — Other Ambulatory Visit: Payer: Self-pay

## 2022-11-10 MED ORDER — FAMOTIDINE 20 MG PO TABS
20.0000 mg | ORAL_TABLET | Freq: Two times a day (BID) | ORAL | 1 refills | Status: DC
  Filled 2022-11-10: qty 180, 90d supply, fill #0

## 2022-11-10 MED FILL — Levothyroxine Sodium Tab 125 MCG: ORAL | 90 days supply | Qty: 90 | Fill #1 | Status: AC

## 2022-11-13 ENCOUNTER — Other Ambulatory Visit: Payer: Self-pay

## 2022-11-14 ENCOUNTER — Other Ambulatory Visit: Payer: Self-pay | Admitting: Family Medicine

## 2022-11-14 ENCOUNTER — Other Ambulatory Visit: Payer: Self-pay

## 2022-11-14 DIAGNOSIS — F419 Anxiety disorder, unspecified: Secondary | ICD-10-CM

## 2022-11-15 ENCOUNTER — Other Ambulatory Visit: Payer: Self-pay

## 2022-11-16 ENCOUNTER — Other Ambulatory Visit: Payer: Self-pay

## 2022-11-16 MED ORDER — NALTREXONE-BUPROPION HCL ER 8-90 MG PO TB12
2.0000 | ORAL_TABLET | Freq: Two times a day (BID) | ORAL | 0 refills | Status: DC
  Filled 2022-11-16 – 2022-12-12 (×2): qty 360, 90d supply, fill #0

## 2022-11-16 MED FILL — Sertraline HCl Tab 50 MG: ORAL | 90 days supply | Qty: 90 | Fill #0 | Status: CN

## 2022-11-16 NOTE — Telephone Encounter (Signed)
We can certainly try as long as she tolerates it well  If that gives her trouble sleeping then go back to 1 in the evening   I sent to her pharmacy

## 2022-11-16 NOTE — Telephone Encounter (Signed)
Pt said she isn't sure how this was removed but she is taking 50 mg of Zoloft and hasn't stopped taking it. Pt said she had 100 mg tablet and was taking 1/2 tab daily so she is okay with PCP sending in 100 mg with directions of 1/2 or just send in the 50 mg pill (either one is fine)  ** pt did also have another question. Pt said her husband is a Teacher, early years/pre and he wanted to check with PCP to make sure pt is on the right dose of her appetite suppressant med. Spouse said he usually fills 2 tabs in am and 2 tabs in PM but pt is taking 2 tabs in am and 1 tab in PM and he just wanted her to double check to see if that's what PCP wants her to stay on

## 2022-11-16 NOTE — Telephone Encounter (Signed)
Pt notified of Dr. Tower's comments and verbalized understanding  

## 2022-11-16 NOTE — Telephone Encounter (Signed)
Looks like weight management d/c med in April but last filled on 08/21/21 #90 tabs/ 3 refill, last OV was a virtual to discuss weight loss options

## 2022-11-16 NOTE — Telephone Encounter (Signed)
Please check in on her to see if she wants to start back on this and how she is doing  If so (if has been off of it) would usually start 50 mg first then gradually increase to 100   Schedule appointment if needed  Thanks

## 2022-11-27 ENCOUNTER — Other Ambulatory Visit: Payer: Self-pay

## 2022-11-30 ENCOUNTER — Other Ambulatory Visit: Payer: Self-pay

## 2022-11-30 MED FILL — Sertraline HCl Tab 50 MG: ORAL | 90 days supply | Qty: 90 | Fill #0 | Status: AC

## 2022-11-30 MED FILL — Sertraline HCl Tab 50 MG: ORAL | 90 days supply | Qty: 90 | Fill #0 | Status: CN

## 2022-12-12 ENCOUNTER — Other Ambulatory Visit: Payer: Self-pay

## 2023-01-19 DIAGNOSIS — Z3009 Encounter for other general counseling and advice on contraception: Secondary | ICD-10-CM | POA: Diagnosis not present

## 2023-01-19 DIAGNOSIS — Z975 Presence of (intrauterine) contraceptive device: Secondary | ICD-10-CM | POA: Diagnosis not present

## 2023-01-19 DIAGNOSIS — N920 Excessive and frequent menstruation with regular cycle: Secondary | ICD-10-CM | POA: Diagnosis not present

## 2023-01-22 DIAGNOSIS — N920 Excessive and frequent menstruation with regular cycle: Secondary | ICD-10-CM | POA: Diagnosis not present

## 2023-02-19 ENCOUNTER — Other Ambulatory Visit: Payer: Self-pay

## 2023-02-19 MED FILL — Levothyroxine Sodium Tab 125 MCG: ORAL | 90 days supply | Qty: 90 | Fill #2 | Status: AC

## 2023-02-20 ENCOUNTER — Other Ambulatory Visit: Payer: Self-pay | Admitting: Family Medicine

## 2023-02-20 ENCOUNTER — Other Ambulatory Visit: Payer: Self-pay

## 2023-02-20 MED ORDER — CONTRAVE 8-90 MG PO TB12
2.0000 | ORAL_TABLET | Freq: Two times a day (BID) | ORAL | 1 refills | Status: DC
  Filled 2023-02-20 – 2023-02-28 (×2): qty 360, 90d supply, fill #0

## 2023-02-20 NOTE — Telephone Encounter (Signed)
Last filled on 11/16/22 #360 tab 0 refill  Last OV was a virtual visit on 10/02/22

## 2023-02-21 ENCOUNTER — Other Ambulatory Visit: Payer: Self-pay

## 2023-02-23 ENCOUNTER — Other Ambulatory Visit: Payer: Self-pay

## 2023-02-28 ENCOUNTER — Other Ambulatory Visit: Payer: Self-pay

## 2023-03-26 ENCOUNTER — Ambulatory Visit: Payer: 59 | Admitting: Family Medicine

## 2023-03-26 VITALS — BP 140/92 | HR 79 | Temp 98.0°F | Ht 68.0 in | Wt 232.1 lb

## 2023-03-26 DIAGNOSIS — W448XXA Other foreign body entering into or through a natural orifice, initial encounter: Secondary | ICD-10-CM

## 2023-03-26 DIAGNOSIS — T192XXA Foreign body in vulva and vagina, initial encounter: Secondary | ICD-10-CM | POA: Diagnosis not present

## 2023-03-26 NOTE — Progress Notes (Signed)
   Shakara Tweedy T. Trinty Marken, MD, CAQ Sports Medicine [facility] [address] [place], [address]  Phone: [phone]  FAX: [phone]  Vanessa Adams - 38 y.o. female  MRN [id number]  Date of Birth: 09-01-1985  Date: 03/26/2023  PCP: Randeen Laine DELENA, MD  Referral: Randeen Laine DELENA, MD  Chief Complaint  Patient presents with   Isidro Merck   Subjective:   Vanessa Adams is a 38 y.o. very pleasant female patient with Body mass index is 35.29 kg/m. who presents with the following:  Question of possible retained tampon.  Having weird abdominal cramps - not sure if she had one or not.  She previously had a retained tampon, so she is not sure if she has 1 in right now or not.  She would like for this to be checked in the office.  She does have some abdominal cramping.  No retained tampon  Review of Systems is noted in the HPI, as appropriate  Objective:   BP (!) 140/92 (BP Location: Right Arm, Patient Position: Sitting, Cuff Size: Large)   Pulse 79   Temp 98 F (36.7 C) (Temporal)   Ht 5' 8 (1.727 m)   Wt 232 lb 2 oz (105.3 kg)   SpO2 98%   BMI 35.29 kg/m   GEN: No acute distress; alert,appropriate. PULM: Breathing comfortably in no respiratory distress PSYCH: Normally interactive.  GYN: This portion of the physical examination was chaperoned by Arland Morel, CMA.  A speculum was used to examine the vaginal canal, and there is no evidence for tampon  Laboratory and Imaging Data:  Assessment and Plan:     ICD-10-CM   1. Retained tampon not found on examination  T19.2XXA    W44.8XXA      Normal exam.  Follow-up as needed  Dragon [employer] One speech-to-text software was used for transcription in this dictation.  Possible transcriptional errors can occur using Animal nutritionist.   Signed,  Jacques DASEN. Ainsley Sanguinetti, MD   Outpatient Encounter Medications as of 03/26/2023  Medication Sig   cholecalciferol (VITAMIN D3) 25 MCG (1000  UNIT) tablet Take 2,000 Units by mouth daily.   etonogestrel  (NEXPLANON ) 68 MG IMPL implant 1 each by Subdermal route once.   famotidine  (PEPCID ) 20 MG tablet Take 1 tablet (20 mg total) by mouth 2 (two) times daily.   levothyroxine  (SYNTHROID ) 125 MCG tablet Take 1 tablet (125 mcg total) by mouth daily before breakfast.   Multiple Vitamin (MULTIVITAMIN) capsule Take 1 capsule by mouth daily.   Naltrexone -buPROPion  HCl ER (CONTRAVE ) 8-90 MG TB12 Take 2 tablets by mouth in the morning and at bedtime.   sertraline  (ZOLOFT ) 50 MG tablet Take 1 tablet (50 mg total) by mouth daily.   No facility-administered encounter medications on file as of 03/26/2023.

## 2023-04-11 NOTE — H&P (Signed)
 Vanessa Adams is an 38 y.o. [phone] who is admitted for Laparoscopic bilateral salpingectomy, Hysteroscopy with Dilation and Curettage with possible Myosure polypectomy, removal of Nexplanon, and placement of Mirena IUD for desire for permanent sterilization and AUB.  Pt reports that she went back to [facility] and was told she was too young for ablation.She reports maternal grandmother died of ovarian cancer, and she had genetic screening that was normal and had EMBPt was [place] considering ablation as she does not desire a period any longer and would like to get off of birth control. She was on OCPs since 11 due to heavy and painful cycles, and currently has Nexplanon but it is due to be replaced soon. Although her husband has had a vasectomy, she strongly desires permanent steirlization for herself as well. She does not desire future fertility. After discussion regarding the risks/benefits of endometrial ablation, patient opts for Mirena IUD instead.  Patient Active Problem List   Diagnosis Date Noted   Obesity (BMI 30-39.9) 10/02/2022   Retained tampon 07/12/2022   Prediabetes 06/22/2022   Mood disorder-Emotional Eating 06/14/2022   Hair loss 04/13/2022   Constipation 05/09/2021   Abdominal bloating 12/24/2020   Acid reflux 12/24/2020   Menometrorrhagia 02/23/2020   Dysmenorrhea 02/04/2020   Vitamin D deficiency 02/04/2020   Contraception management 12/26/2017   Anxiety and depression 02/25/2014   Hypertriglyceridemia 11/04/2012   Hypothyroidism     MEDICAL/FAMILY/SOCIAL HX: No LMP recorded. Patient has had an implant.    Past Medical History:  Diagnosis Date   Allergy    Anxiety    Autism    Back pain    Bell palsy    Bell's Palsy 2015   BRCA negative 02/2020   MyRisk neg; IBIS=13.4%/riskscore=7.4%   Carpal tunnel syndrome    Constipation, acute    Family history of ovarian cancer    Gallbladder disorder    GERD (gastroesophageal reflux disease)     Hashimoto's disease    History of Bell's palsy    affected left side   Hypoglycemia    Rheumatoid arthritis (HCC)    Thyroid disease    hypothyroidism   Vitamin D deficiency     Past Surgical History:  Procedure Laterality Date   adenoids     CHOLECYSTECTOMY  2007   TONSILECTOMY/ADENOIDECTOMY WITH MYRINGOTOMY      Family History  Problem Relation Age of Onset   Ovarian cysts Mother        has marker for ovarian cancer   Anxiety disorder Mother    Obesity Mother    Hyperlipidemia Father    Hypertension Father    Diabetes Father    Cancer Maternal Grandmother 86       ovarian cancer, colon, rectal   Hypothyroidism Paternal Grandmother    Skin cancer Paternal Grandfather 74    Social History:  reports that she has quit smoking. She has never used smokeless tobacco. She reports current alcohol use. She reports that she does not use drugs.  ALLERGIES/MEDS:  Allergies:  Allergies  Allergen Reactions   Sulfa Antibiotics Rash    stevens-johnsons    Tuberculin Tests Hives   Percocet [Oxycodone-Acetaminophen] Itching   Vicodin [Hydrocodone-Acetaminophen] Itching    No medications prior to admission.     Review of Systems  All other systems reviewed and are negative.   There were no vitals taken for this visit. Gen:  NAD, pleasant and cooperative Cardio:  RRR Pulm:  CTAB, no wheezes/rales/rhonchi Abd:  Soft, non-distended,  non-tender throughout, no rebound/guarding Ext:  No bilateral LE edema, no bilateral calf tenderness  No results found for this or any previous visit (from the past 24 hours).  No results found.   ASSESSMENT/PLAN: Vanessa Adams is a 38 y.o. [phone] who is admitted for Laparoscopic bilateral salpingectomy, Hysteroscopy with Dilation and Curettage with possible Myosure polypectomy, removal of Nexplanon, and placement of Mirena IUD for desire for permanent sterilization and AUB.  - Admit to [facility] OR - Admit labs (CBC, T&S) - Diet:   ERAS pathway/per anesthesia - IVF:  Per anesthesia - VTE Prophylaxis:  SCDs - Antibiotics: None - D/C home same-day  Consents: I have discussed with the patient that this surgery is performed to provide permanent female sterilization by removing the entirety of each fallopian tube.  Prior to surgery, the risks and benefits of the surgery, as well as alternative treatments, have been discussed.  The risks of proceeding with this surgery include, but are not limited to, bleeding, including the need for a blood transfusion, infection, damage to surrounding organs and tissues, damage to bladder, damage to ureters, causing kidney damage, and requiring additional procedures, damage to bowels, resulting in further surgery, postoperative pain, short-term and long-term, scarring on the abdominal wall and intra-abdominally, need for further surgery, development of an incisional hernia, need for an open procedure, deep vein thrombosis and/or pulmonary embolism, wound infection and/or separation, painful intercourse, failure of the procedure to prevent pregnancy, increased risk of ectopic pregnancy requiring surgery if procedure failure occurs complications the course of which cannot be predicted or prevented, and death.  Patient was counseled on the risks, benefits and alternatives of bilateral tubal sterilization.  Like any surgery this procedure carries risks including to but not limited to infection, damage to surrounding structures requiring additional surgery, thromboembolism, and even death.  She understands that there is a rare risk of failure, and if she were to get pregnant after a bilateral tubal sterilization, there is a chance of ectopic pregnancy.  She was advised that if she should miss her period andor have a positive pregnancy test after her bilateral tubal sterilization, she should seek medical assistance.  She is aware that this is a permanent procedure and that reversal is not an option. Patient was  counseled to the risks of IUD placement which include expulsion, malposition, or migration. Discussed that total amenorrhea may not be achieved. Patient was counseled regarding the removal of Nexplanon - risk for bleeding and infection discussed. Patient was consented for blood products.  The patient is aware that bleeding may result in the need for a blood transfusion which includes risk of transmission of HIV (1:2 million), Hepatitis C (1:2 million), and Hepatitis B (1:200 thousand) and transfusion reaction.  Patient voiced understanding of the above risks as well as understanding of indications for blood transfusion.     Steva Ready, DO

## 2023-04-27 DIAGNOSIS — Z01818 Encounter for other preprocedural examination: Secondary | ICD-10-CM | POA: Diagnosis not present

## 2023-04-27 DIAGNOSIS — N939 Abnormal uterine and vaginal bleeding, unspecified: Secondary | ICD-10-CM | POA: Diagnosis not present

## 2023-05-03 ENCOUNTER — Other Ambulatory Visit: Payer: Self-pay

## 2023-05-03 ENCOUNTER — Other Ambulatory Visit: Payer: Self-pay | Admitting: Family Medicine

## 2023-05-03 DIAGNOSIS — E079 Disorder of thyroid, unspecified: Secondary | ICD-10-CM

## 2023-05-03 MED ORDER — LEVOTHYROXINE SODIUM 125 MCG PO TABS
125.0000 ug | ORAL_TABLET | Freq: Every day | ORAL | 0 refills | Status: DC
  Filled 2023-05-03: qty 90, 90d supply, fill #0

## 2023-05-03 NOTE — Telephone Encounter (Signed)
 Pt is due for a CPE (labs prior) or at least a f/u (labs prior), hasn't had thyroid labs or other labs in over a year. Please schedule and then route back to me

## 2023-05-03 NOTE — Telephone Encounter (Signed)
 Call pt and schedule a appt for cpe

## 2023-05-06 ENCOUNTER — Telehealth: Payer: Self-pay | Admitting: Family Medicine

## 2023-05-06 DIAGNOSIS — Z0000 Encounter for general adult medical examination without abnormal findings: Secondary | ICD-10-CM | POA: Insufficient documentation

## 2023-05-06 DIAGNOSIS — Z302 Encounter for sterilization: Secondary | ICD-10-CM | POA: Insufficient documentation

## 2023-05-06 DIAGNOSIS — E559 Vitamin D deficiency, unspecified: Secondary | ICD-10-CM

## 2023-05-06 DIAGNOSIS — E063 Autoimmune thyroiditis: Secondary | ICD-10-CM

## 2023-05-06 DIAGNOSIS — E781 Pure hyperglyceridemia: Secondary | ICD-10-CM

## 2023-05-06 DIAGNOSIS — R7303 Prediabetes: Secondary | ICD-10-CM

## 2023-05-06 NOTE — Telephone Encounter (Signed)
-----   Message from Alvina Chou sent at 05/04/2023 12:56 PM EST ----- Regarding: Lab orders for Monday, 2.24.25 Patient is scheduled for CPX labs, please order future labs, Thanks , Camelia Eng

## 2023-05-07 ENCOUNTER — Encounter (HOSPITAL_COMMUNITY): Payer: Self-pay | Admitting: Obstetrics and Gynecology

## 2023-05-07 ENCOUNTER — Other Ambulatory Visit (INDEPENDENT_AMBULATORY_CARE_PROVIDER_SITE_OTHER): Payer: 59

## 2023-05-07 DIAGNOSIS — Z0000 Encounter for general adult medical examination without abnormal findings: Secondary | ICD-10-CM

## 2023-05-07 DIAGNOSIS — R7303 Prediabetes: Secondary | ICD-10-CM

## 2023-05-07 DIAGNOSIS — E559 Vitamin D deficiency, unspecified: Secondary | ICD-10-CM

## 2023-05-07 DIAGNOSIS — E063 Autoimmune thyroiditis: Secondary | ICD-10-CM | POA: Diagnosis not present

## 2023-05-07 DIAGNOSIS — E781 Pure hyperglyceridemia: Secondary | ICD-10-CM

## 2023-05-07 LAB — CBC WITH DIFFERENTIAL/PLATELET
Basophils Absolute: 0 10*3/uL (ref 0.0–0.1)
Basophils Relative: 0.5 % (ref 0.0–3.0)
Eosinophils Absolute: 0.1 10*3/uL (ref 0.0–0.7)
Eosinophils Relative: 1.1 % (ref 0.0–5.0)
HCT: 42.2 % (ref 36.0–46.0)
Hemoglobin: 14.2 g/dL (ref 12.0–15.0)
Lymphocytes Relative: 36 % (ref 12.0–46.0)
Lymphs Abs: 1.9 10*3/uL (ref 0.7–4.0)
MCHC: 33.6 g/dL (ref 30.0–36.0)
MCV: 101.5 fl — ABNORMAL HIGH (ref 78.0–100.0)
Monocytes Absolute: 0.4 10*3/uL (ref 0.1–1.0)
Monocytes Relative: 8 % (ref 3.0–12.0)
Neutro Abs: 2.9 10*3/uL (ref 1.4–7.7)
Neutrophils Relative %: 54.4 % (ref 43.0–77.0)
Platelets: 259 10*3/uL (ref 150.0–400.0)
RBC: 4.16 Mil/uL (ref 3.87–5.11)
RDW: 11.7 % (ref 11.5–15.5)
WBC: 5.4 10*3/uL (ref 4.0–10.5)

## 2023-05-07 LAB — LIPID PANEL
Cholesterol: 128 mg/dL (ref 0–200)
HDL: 41.4 mg/dL (ref >39.00)
LDL Cholesterol: 67 mg/dL (ref 0–99)
NonHDL: 86.65
Total CHOL/HDL Ratio: 3
Triglycerides: 98 mg/dL (ref 0.0–149.0)
VLDL: 19.6 mg/dL (ref 0.0–40.0)

## 2023-05-07 LAB — COMPREHENSIVE METABOLIC PANEL
ALT: 18 U/L (ref 0–35)
AST: 15 U/L (ref 0–37)
Albumin: 4.2 g/dL (ref 3.5–5.2)
Alkaline Phosphatase: 48 U/L (ref 39–117)
BUN: 14 mg/dL (ref 6–23)
CO2: 29 meq/L (ref 19–32)
Calcium: 9.1 mg/dL (ref 8.4–10.5)
Chloride: 104 meq/L (ref 96–112)
Creatinine, Ser: 0.6 mg/dL (ref 0.40–1.20)
GFR: 113.27 mL/min (ref >60.00)
Glucose, Bld: 93 mg/dL (ref 70–99)
Potassium: 4.3 meq/L (ref 3.5–5.1)
Sodium: 138 meq/L (ref 135–145)
Total Bilirubin: 0.7 mg/dL (ref 0.2–1.2)
Total Protein: 7.4 g/dL (ref 6.0–8.3)

## 2023-05-07 LAB — TSH: TSH: 3.13 u[IU]/mL (ref 0.35–5.50)

## 2023-05-07 LAB — VITAMIN D 25 HYDROXY (VIT D DEFICIENCY, FRACTURES): VITD: 21.1 ng/mL — ABNORMAL LOW (ref 30.00–100.00)

## 2023-05-07 LAB — HEMOGLOBIN A1C: Hgb A1c MFr Bld: 5.1 % (ref 4.6–6.5)

## 2023-05-07 NOTE — Progress Notes (Signed)
 Spoke w/ via phone for pre-op interview--- [name] needs dos----  CBC, T&S, and UPT per surgeon.       Lab results------Current EKG in Epic dated 06/2022 COVID test -----patient states asymptomatic no test needed Arrive at -------0700 NPO after MN NO Solid Food.  Clear liquids from MN until---0600 Pre-Surgery Ensure or G2:n/a  Med rec completed Medications to take morning of surgery -----NONE Diabetic medication -----  GLP1 agonist last dose:n/a GLP1 instructions:  Patient instructed no nail polish to be worn day of surgery Patient instructed to bring photo id and insurance card day of surgery Patient aware to have Driver (ride ) / caregiver    for 24 hours after surgery - Vanessa Adams husband Patient Special Instructions ----- Patient to shower with antibacterial soap, pt states unable to use Dial soap. Pre-Op special Instructions -----  Patient verbalized understanding of instructions that were given at this phone interview. Patient denies chest pain, sob, fever, cough at the interview.

## 2023-05-08 ENCOUNTER — Other Ambulatory Visit: Payer: Self-pay

## 2023-05-09 ENCOUNTER — Other Ambulatory Visit: Payer: Self-pay

## 2023-05-09 ENCOUNTER — Ambulatory Visit (HOSPITAL_COMMUNITY): Payer: Self-pay | Admitting: Registered Nurse

## 2023-05-09 ENCOUNTER — Encounter (HOSPITAL_COMMUNITY)
Admission: RE | Disposition: A | Discharge status: Home / Self Care | Payer: Self-pay | Source: Ambulatory Visit | Attending: Obstetrics and Gynecology

## 2023-05-09 ENCOUNTER — Ambulatory Visit (HOSPITAL_BASED_OUTPATIENT_CLINIC_OR_DEPARTMENT_OTHER): Payer: Self-pay | Admitting: Registered Nurse

## 2023-05-09 ENCOUNTER — Ambulatory Visit (HOSPITAL_COMMUNITY)
Admission: RE | Admit: 2023-05-09 | Discharge: 2023-05-09 | Disposition: A | Discharge status: Home / Self Care | Payer: 59 | Source: Ambulatory Visit | Attending: Obstetrics and Gynecology | Admitting: Obstetrics and Gynecology

## 2023-05-09 ENCOUNTER — Encounter (HOSPITAL_COMMUNITY): Payer: Self-pay | Admitting: Obstetrics and Gynecology

## 2023-05-09 DIAGNOSIS — Z79899 Other long term (current) drug therapy: Secondary | ICD-10-CM | POA: Insufficient documentation

## 2023-05-09 DIAGNOSIS — F418 Other specified anxiety disorders: Secondary | ICD-10-CM

## 2023-05-09 DIAGNOSIS — F32A Depression, unspecified: Secondary | ICD-10-CM | POA: Diagnosis not present

## 2023-05-09 DIAGNOSIS — F419 Anxiety disorder, unspecified: Secondary | ICD-10-CM | POA: Diagnosis not present

## 2023-05-09 DIAGNOSIS — Z3046 Encounter for surveillance of implantable subdermal contraceptive: Secondary | ICD-10-CM | POA: Diagnosis not present

## 2023-05-09 DIAGNOSIS — F840 Autistic disorder: Secondary | ICD-10-CM | POA: Insufficient documentation

## 2023-05-09 DIAGNOSIS — E039 Hypothyroidism, unspecified: Secondary | ICD-10-CM

## 2023-05-09 DIAGNOSIS — Z87891 Personal history of nicotine dependence: Secondary | ICD-10-CM | POA: Insufficient documentation

## 2023-05-09 DIAGNOSIS — Z302 Encounter for sterilization: Secondary | ICD-10-CM

## 2023-05-09 DIAGNOSIS — Z30430 Encounter for insertion of intrauterine contraceptive device: Secondary | ICD-10-CM | POA: Diagnosis not present

## 2023-05-09 DIAGNOSIS — M069 Rheumatoid arthritis, unspecified: Secondary | ICD-10-CM | POA: Diagnosis not present

## 2023-05-09 DIAGNOSIS — K219 Gastro-esophageal reflux disease without esophagitis: Secondary | ICD-10-CM | POA: Diagnosis not present

## 2023-05-09 DIAGNOSIS — E063 Autoimmune thyroiditis: Secondary | ICD-10-CM | POA: Diagnosis not present

## 2023-05-09 DIAGNOSIS — N939 Abnormal uterine and vaginal bleeding, unspecified: Secondary | ICD-10-CM | POA: Insufficient documentation

## 2023-05-09 HISTORY — PX: DILATATION & CURETTAGE/HYSTEROSCOPY WITH MYOSURE: SHX6511

## 2023-05-09 HISTORY — PX: LAPAROSCOPIC BILATERAL SALPINGECTOMY: SHX5889

## 2023-05-09 HISTORY — PX: REMOVAL OF NON VAGINAL CONTRACEPTIVE DEVICE: SHX6219

## 2023-05-09 LAB — TYPE AND SCREEN
ABO/RH(D): A POS
Antibody Screen: NEGATIVE

## 2023-05-09 LAB — POCT PREGNANCY, URINE: Preg Test, Ur: NEGATIVE

## 2023-05-09 SURGERY — DILATATION & CURETTAGE/HYSTEROSCOPY WITH MYOSURE
Anesthesia: General

## 2023-05-09 MED ORDER — IBUPROFEN 800 MG PO TABS
800.0000 mg | ORAL_TABLET | Freq: Three times a day (TID) | ORAL | 0 refills | Status: AC | PRN
Start: 2023-05-09 — End: ?
  Filled 2023-05-09: qty 30, 10d supply, fill #0

## 2023-05-09 MED ORDER — LEVONORGESTREL 20 MCG/DAY IU IUD
1.0000 | INTRAUTERINE_SYSTEM | Freq: Once | INTRAUTERINE | Status: AC
  Administered 2023-05-09: 1 via INTRAUTERINE
  Filled 2023-05-09: qty 1

## 2023-05-09 MED ORDER — LIDOCAINE 2% (20 MG/ML) 5 ML SYRINGE
INTRAMUSCULAR | Status: AC
  Filled 2023-05-09: qty 5

## 2023-05-09 MED ORDER — MEPERIDINE HCL 25 MG/ML IJ SOLN: 6.2500 mg | INTRAMUSCULAR | Status: DC | PRN

## 2023-05-09 MED ORDER — MIDAZOLAM HCL 2 MG/2ML IJ SOLN: 0.5000 mg | Freq: Once | INTRAMUSCULAR | Status: DC | PRN

## 2023-05-09 MED ORDER — PROPOFOL 10 MG/ML IV BOLUS
INTRAVENOUS | Status: DC | PRN
  Administered 2023-05-09: 200 mg via INTRAVENOUS

## 2023-05-09 MED ORDER — ACETAMINOPHEN 500 MG PO TABS
1000.0000 mg | ORAL_TABLET | Freq: Once | ORAL | Status: AC
  Administered 2023-05-09: 1000 mg via ORAL

## 2023-05-09 MED ORDER — ONDANSETRON HCL 4 MG/2ML IJ SOLN
INTRAMUSCULAR | Status: AC
  Filled 2023-05-09: qty 2

## 2023-05-09 MED ORDER — HYDROMORPHONE HCL 1 MG/ML IJ SOLN: 0.2500 mg | INTRAMUSCULAR | Status: DC | PRN

## 2023-05-09 MED ORDER — OXYCODONE HCL 5 MG/5ML PO SOLN: 5.0000 mg | Freq: Once | ORAL | Status: DC | PRN

## 2023-05-09 MED ORDER — ONDANSETRON HCL 4 MG/2ML IJ SOLN
INTRAMUSCULAR | Status: DC | PRN
  Administered 2023-05-09: 4 mg via INTRAVENOUS

## 2023-05-09 MED ORDER — ROCURONIUM BROMIDE 10 MG/ML (PF) SYRINGE
PREFILLED_SYRINGE | INTRAVENOUS | Status: AC
  Filled 2023-05-09: qty 10

## 2023-05-09 MED ORDER — DEXAMETHASONE SODIUM PHOSPHATE 10 MG/ML IJ SOLN
INTRAMUSCULAR | Status: AC
  Filled 2023-05-09: qty 1

## 2023-05-09 MED ORDER — SODIUM CHLORIDE 0.9 % IR SOLN
Status: DC | PRN
  Administered 2023-05-09: 1000 mL

## 2023-05-09 MED ORDER — SCOPOLAMINE 1 MG/3DAYS TD PT72
MEDICATED_PATCH | TRANSDERMAL | Status: AC
  Administered 2023-05-09: 1.5 mg via TRANSDERMAL
  Filled 2023-05-09: qty 1

## 2023-05-09 MED ORDER — OXYCODONE HCL 5 MG PO TABS: 5.0000 mg | ORAL_TABLET | Freq: Once | ORAL | Status: DC | PRN

## 2023-05-09 MED ORDER — SUGAMMADEX SODIUM 200 MG/2ML IV SOLN
INTRAVENOUS | Status: AC
  Filled 2023-05-09: qty 2

## 2023-05-09 MED ORDER — MIDAZOLAM HCL 2 MG/2ML IJ SOLN
INTRAMUSCULAR | Status: AC
  Filled 2023-05-09: qty 2

## 2023-05-09 MED ORDER — CHLORHEXIDINE GLUCONATE 0.12 % MT SOLN: 15.0000 mL | Freq: Once | OROMUCOSAL | Status: AC

## 2023-05-09 MED ORDER — LACTATED RINGERS IV SOLN: INTRAVENOUS | Status: DC

## 2023-05-09 MED ORDER — FENTANYL CITRATE (PF) 250 MCG/5ML IJ SOLN
INTRAMUSCULAR | Status: DC | PRN
  Administered 2023-05-09: 150 ug via INTRAVENOUS
  Administered 2023-05-09 (×2): 50 ug via INTRAVENOUS

## 2023-05-09 MED ORDER — MIDAZOLAM HCL 2 MG/2ML IJ SOLN
INTRAMUSCULAR | Status: DC | PRN
  Administered 2023-05-09: 2 mg via INTRAVENOUS

## 2023-05-09 MED ORDER — LIDOCAINE HCL 1 % IJ SOLN
INTRAMUSCULAR | Status: AC
  Filled 2023-05-09: qty 20

## 2023-05-09 MED ORDER — FENTANYL CITRATE (PF) 250 MCG/5ML IJ SOLN
INTRAMUSCULAR | Status: AC
  Filled 2023-05-09: qty 5

## 2023-05-09 MED ORDER — LIDOCAINE 2% (20 MG/ML) 5 ML SYRINGE
INTRAMUSCULAR | Status: DC | PRN
  Administered 2023-05-09: 30 mg via INTRAVENOUS

## 2023-05-09 MED ORDER — ROCURONIUM BROMIDE 10 MG/ML (PF) SYRINGE
PREFILLED_SYRINGE | INTRAVENOUS | Status: DC | PRN
  Administered 2023-05-09: 60 mg via INTRAVENOUS

## 2023-05-09 MED ORDER — PROPOFOL 10 MG/ML IV BOLUS
INTRAVENOUS | Status: AC
  Filled 2023-05-09: qty 20

## 2023-05-09 MED ORDER — BUPIVACAINE HCL (PF) 0.25 % IJ SOLN
INTRAMUSCULAR | Status: DC | PRN
  Administered 2023-05-09: 25 mL

## 2023-05-09 MED ORDER — CHLORHEXIDINE GLUCONATE 0.12 % MT SOLN
OROMUCOSAL | Status: AC
  Administered 2023-05-09: 15 mL via OROMUCOSAL
  Filled 2023-05-09: qty 15

## 2023-05-09 MED ORDER — BUPIVACAINE HCL (PF) 0.25 % IJ SOLN
INTRAMUSCULAR | Status: AC
  Filled 2023-05-09: qty 30

## 2023-05-09 MED ORDER — ORAL CARE MOUTH RINSE: 15.0000 mL | Freq: Once | OROMUCOSAL | Status: AC

## 2023-05-09 MED ORDER — SCOPOLAMINE 1 MG/3DAYS TD PT72: 1.0000 | MEDICATED_PATCH | TRANSDERMAL | Status: DC

## 2023-05-09 MED ORDER — ACETAMINOPHEN 500 MG PO TABS
ORAL_TABLET | ORAL | Status: AC
  Filled 2023-05-09: qty 2

## 2023-05-09 MED ORDER — TRAMADOL HCL 50 MG PO TABS
25.0000 mg | ORAL_TABLET | Freq: Four times a day (QID) | ORAL | 0 refills | Status: DC | PRN
  Filled 2023-05-09: qty 12, 6d supply, fill #0

## 2023-05-09 MED ORDER — SUGAMMADEX SODIUM 200 MG/2ML IV SOLN
INTRAVENOUS | Status: DC | PRN
  Administered 2023-05-09: 200 mg via INTRAVENOUS
  Administered 2023-05-09: 100 mg via INTRAVENOUS

## 2023-05-09 MED ORDER — DEXAMETHASONE SODIUM PHOSPHATE 10 MG/ML IJ SOLN
INTRAMUSCULAR | Status: DC | PRN
  Administered 2023-05-09: 10 mg via INTRAVENOUS

## 2023-05-09 SURGICAL SUPPLY — 46 items
BAG COUNTER SPONGE SURGICOUNT (BAG) ×3 IMPLANT
BNDG COHESIVE 6X5 TAN NS LF (GAUZE/BANDAGES/DRESSINGS) IMPLANT
CABLE HIGH FREQUENCY MONO STRZ (ELECTRODE) IMPLANT
CATH ROBINSON RED A/P 16FR (CATHETERS) IMPLANT
CNTNR URN SCR LID CUP LEK RST (MISCELLANEOUS) ×3 IMPLANT
DERMABOND ADVANCED .7 DNX6 (GAUZE/BANDAGES/DRESSINGS) IMPLANT
DEVICE MYOSURE LITE (MISCELLANEOUS) IMPLANT
DEVICE MYOSURE REACH (MISCELLANEOUS) IMPLANT
DILATOR CANAL MILEX (MISCELLANEOUS) IMPLANT
DRSG OPSITE POSTOP 3X4 (GAUZE/BANDAGES/DRESSINGS) IMPLANT
GLOVE BIO SURGEON STRL SZ 6.5 (GLOVE) ×6 IMPLANT
GLOVE BIOGEL PI IND STRL 6.5 (GLOVE) ×3 IMPLANT
GLOVE BIOGEL PI IND STRL 7.0 (GLOVE) ×6 IMPLANT
GOWN STRL REUS W/ TWL LRG LVL3 (GOWN DISPOSABLE) ×6 IMPLANT
IRRIG SUCT STRYKERFLOW 2 WTIP (MISCELLANEOUS) IMPLANT
IRRIGATION SUCT STRKRFLW 2 WTP (MISCELLANEOUS) IMPLANT
KIT PINK PAD W/HEAD ARE REST (MISCELLANEOUS) IMPLANT
KIT PINK PAD W/HEAD ARM REST (MISCELLANEOUS) IMPLANT
KIT PROCEDURE FLUENT (KITS) ×3 IMPLANT
KIT TURNOVER KIT B (KITS) ×3 IMPLANT
LIGASURE LAP MARYLAND 5MM 37CM (ELECTROSURGICAL) IMPLANT
LIGASURE VESSEL 5MM BLUNT TIP (ELECTROSURGICAL) IMPLANT
Mirena IMPLANT
NS IRRIG 1000ML POUR BTL (IV SOLUTION) ×3 IMPLANT
PACK LAPAROSCOPY BASIN (CUSTOM PROCEDURE TRAY) ×3 IMPLANT
PACK VAGINAL MINOR WOMEN LF (CUSTOM PROCEDURE TRAY) ×3 IMPLANT
PAD OB MATERNITY 11 LF (PERSONAL CARE ITEMS) ×3 IMPLANT
PROTECTOR NERVE ULNAR (MISCELLANEOUS) ×6 IMPLANT
SEAL ROD LENS SCOPE MYOSURE (ABLATOR) ×3 IMPLANT
SEALER TISSUE G2 CVD JAW 35 (ENDOMECHANICALS) IMPLANT
SET TUBE SMOKE EVAC HIGH FLOW (TUBING) ×3 IMPLANT
SLEEVE ADV FIXATION 5X100MM (TROCAR) ×3 IMPLANT
SOL ELECTROSURG ANTI STICK (MISCELLANEOUS) IMPLANT
SOLUTION ELECTROSURG ANTI STCK (MISCELLANEOUS) IMPLANT
STRIP CLOSURE SKIN 1/2X4 (GAUZE/BANDAGES/DRESSINGS) IMPLANT
SUT VIC AB 4-0 PS2 27 (SUTURE) ×3 IMPLANT
SUT VICRYL 0 UR6 27IN ABS (SUTURE) IMPLANT
SYS BAG RETRIEVAL 10MM (BASKET) IMPLANT
SYSTEM BAG RETRIEVAL 10MM (BASKET) IMPLANT
TOWEL GREEN STERILE FF (TOWEL DISPOSABLE) ×6 IMPLANT
TRAY FOLEY W/BAG SLVR 14FR (SET/KITS/TRAYS/PACK) ×3 IMPLANT
TROCAR ADV FIXATION 11X100MM (TROCAR) IMPLANT
TROCAR ADV FIXATION 5X100MM (TROCAR) ×3 IMPLANT
TROCAR Z THREAD OPTICAL 5X150 (TROCAR) IMPLANT
UNDERPAD 30X36 HEAVY ABSORB (UNDERPADS AND DIAPERS) ×3 IMPLANT
WARMER LAPAROSCOPE (MISCELLANEOUS) ×3 IMPLANT

## 2023-05-09 NOTE — Discharge Instructions (Signed)
  Post Anesthesia Home Care Instructions  Activity: Get plenty of rest for the remainder of the day. A responsible individual must stay with you for 24 hours following the procedure.  For the next 24 hours, DO NOT: -Drive a car -Advertising copywriter -Drink alcoholic beverages -Take any medication unless instructed by your physician -Make any legal decisions or sign important papers.  Meals: Start with liquid foods such as gelatin or soup. Progress to regular foods as tolerated. Avoid greasy, spicy, heavy foods. If nausea and/or vomiting occur, drink only clear liquids until the nausea and/or vomiting subsides. Call your physician if vomiting continues.  Special Instructions/Symptoms: Your throat may feel dry or sore from the anesthesia or the breathing tube placed in your throat during surgery. If this causes discomfort, gargle with warm salt water. The discomfort should disappear within 24 hours.  If you had a scopolamine patch placed behind your ear for the management of post- operative nausea and/or vomiting:  1. The medication in the patch is effective for 72 hours, after which it should be removed.  Wrap patch in a tissue and discard in the trash. Wash hands thoroughly with soap and water. 2. You may remove the patch earlier than 72 hours if you experience unpleasant side effects which may include dry mouth, dizziness or visual disturbances. 3. Avoid touching the patch. Wash your hands with soap and water after contact with the patch.    No acetaminophen/Tylenol until after 3 pm today if needed.

## 2023-05-09 NOTE — Op Note (Signed)
 Pre Op Dx:   1. Desires permanent sterilization 2. Abnormal uterine bleeding 3. Desires Mirena IUD insertion 4. Desires removal of Nexplanon  Post Op Dx:   Same as pre-operative diagnosis  Procedure:   1. Laparoscopic bilateral salpingectomy 2. Hysteroscopy with Dilation and Curettage using Myosure 3. Placement of Mirena IUD 4. Removal of Nexplanon (Left arm)   Surgeon:  Dr. Steva Ready Assistants:  Webb Silversmith, RNFA Anesthesia:  General   EBL:  10cc  IVF:  700cc UOP:  100cc Fluid Deficit:  Less than 200cc   Drains:  Foley catheter Specimen removed:  Bilateral fallopian tubes and endometrial curettings - sent to pathology Device(s) implanted: Mirena IUD  Lot No [id number]  Expiration March 2027 Case Type:  Clean-contaminated Findings:  Normal-appearing uterus, bilateral fallopian tubes and ovaries. Ectocervix and endocervical canal appeared normal.  Endometrium proliferative and hyperemic-appearing. Bilateral tubal ostia visualized. The uterus sounded to 10cm pre and post hysteroscopy. Nexplanon palpated in LUE and was positioned diagonally.  Complications: None Indications:  38 y.o. [id number] with desire for permanent sterilization and desires management of AUB with Mirena IUD and desires removal of Nexplanon.  Description of each procedure:  After informed consent the patient was taken to the operating room and placed in dorsal supine position where general endotracheal anesthesia was administered and found to be adequate.  She was placed in dorsal lithotomy position.  She was prepped and draped in the usual sterile fashion. A timeout was called and the procedure confirmed. A Hulka uterine manipulator with and a Foley catheter were placed.  The abdomen was entered through a 5mm umbilical incision under direct visualization and a pneumoperitoneum was established.  Right and left lower quadrant ports were placed under visualization.  The entry point was visualized from an  inferior port and atraumatic entry confirmed. The left fallopian tube was removed from its attachment to the normal-appearing left ovary starting at the fimbria and divided along the mesosalpinx to the level of the uterus. The tube was desiccated thoroughly and divided from the uterus. The specimen was removed.  This process was repeated on the contralateral side. Hemostasis confirmed. Bilateral lower quadrant ports withdrawn. The pneumoperitoneum was reduced completely and the camera port withdrawn under visualization. The skin was closed with 4-0 Vicryl in subcuticular fashion. The Hulka tenaculum was removed.  Attention turned to the hysteroscopic portion of procedure. The anterior lip of the cervix was grasped with a single-tooth tenaculum and the cervix was serially dilated to accommodate the hysteroscope.  The hysteroscope was advanced and the findings as above was noted. The Myosure Reach was resect the endometrium in its entirety.   Attention turned to the placement of Mirena IUD. The Mirena IUD was inserted using the insertor device in standard fashion and strings were trimmed to approximately 3cm. The single-tooth tenaculum was removed and its sites were made hemostatic with silver nitrate and pressure.    Attention turned to the removal of Nexplanon. The left arm was previously marked.  The area surrounding the Nexplanon was prepared with Choloraprep and draped  in the usual sterile manner. A skin incision was made over the distal aspect of the device. The  capsule lysed sharply and the device removed using a hemostat.  Hemostasis was assured. The site was dressed with SteriStrips and a pressure dressing. The patient tolerated the procedure  well.   The patient was returned to dorsal supine position, awakened and extubated in the OR having appeared to tolerate the procedure well.  All sponge,  needle, and instrument counts were correct x 2 at the end of the case.   Disposition:  PACU  Steva Ready, DO

## 2023-05-09 NOTE — Interval H&P Note (Signed)
 History and Physical Interval Note:  05/09/2023 9:06 AM  Vanessa Adams  has presented today for surgery, with the diagnosis of Desire for permanent sterilization, Abnormal uterine bleeding, Desire removal of Nexplanon, Desires placement of Mirena IUD.  The various methods of treatment have been discussed with the patient and family. After consideration of risks, benefits and other options for treatment, the patient has consented to  Procedure(s) with comments: DILATATION & CURETTAGE/HYSTEROSCOPY WITH MYOSURE (N/A) - [facility] LAPAROSCOPIC BILATERAL SALPINGECTOMY IUD PLACEMENT (Bilateral)  REMOVAL OF NEXPLANON as a surgical intervention.  The patient's history has been reviewed, patient examined, no change in status, stable for surgery.  I have reviewed the patient's chart and labs.  Questions were answered to the patient's satisfaction.     Steva Ready

## 2023-05-09 NOTE — Anesthesia Preprocedure Evaluation (Addendum)
 Anesthesia Evaluation  Patient identified by MRN, date of birth, ID band Patient awake    Reviewed: Allergy & Precautions, NPO status , Patient's Chart, lab work & pertinent test results  History of Anesthesia Complications Negative for: history of anesthetic complications  Airway Mallampati: II  TM Distance: >3 FB Neck ROM: Full    Dental  (+) Dental Advisory Given   Pulmonary former smoker   breath sounds clear to auscultation       Cardiovascular negative cardio ROS  Rhythm:Regular Rate:Normal     Neuro/Psych  PSYCHIATRIC DISORDERS (autism) Anxiety Depression    H/o Bell's palsy    GI/Hepatic Neg liver ROS,GERD  Medicated and Controlled,,  Endo/Other  Hypothyroidism  BMI 34.2  Renal/GU negative Renal ROS     Musculoskeletal  (+) Arthritis , Rheumatoid disorders,    Abdominal   Peds  Hematology negative hematology ROS (+)   Anesthesia Other Findings   Reproductive/Obstetrics                              Anesthesia Physical Anesthesia Plan  ASA: 3  Anesthesia Plan: General   Post-op Pain Management: Tylenol PO (pre-op)*   Induction: Intravenous  PONV Risk Score and Plan: 3 and Ondansetron, Dexamethasone and Scopolamine patch - Pre-op  Airway Management Planned: Oral ETT  Additional Equipment: None  Intra-op Plan:   Post-operative Plan: Extubation in OR  Informed Consent: I have reviewed the patients History and Physical, chart, labs and discussed the procedure including the risks, benefits and alternatives for the proposed anesthesia with the patient or authorized representative who has indicated his/her understanding and acceptance.     Dental advisory given  Plan Discussed with: CRNA and Surgeon  Anesthesia Plan Comments:          Anesthesia Quick Evaluation

## 2023-05-09 NOTE — Anesthesia Procedure Notes (Signed)
 Procedure Name: Intubation Date/Time: 05/09/2023 9:34 AM  Performed by: Sharyn Dross, CRNAPre-anesthesia Checklist: Patient identified, Emergency Drugs available, Suction available and Patient being monitored Patient Re-evaluated:Patient Re-evaluated prior to induction Oxygen Delivery Method: Circle system utilized Preoxygenation: Pre-oxygenation with 100% oxygen Induction Type: IV induction Ventilation: Mask ventilation without difficulty and Oral airway inserted - appropriate to patient size Laryngoscope Size: Mac and 4 Grade View: Grade I Tube type: Oral Tube size: 7.0 mm Number of attempts: 1 Airway Equipment and Method: Stylet and Oral airway Placement Confirmation: ETT inserted through vocal cords under direct vision, positive ETCO2 and breath sounds checked- equal and bilateral Secured at: 23 cm Tube secured with: Tape Dental Injury: Teeth and Oropharynx as per pre-operative assessment

## 2023-05-09 NOTE — Anesthesia Postprocedure Evaluation (Signed)
 Anesthesia Post Note  Patient: Vanessa Adams  Procedure(s) Performed: DILATATION & CURETTAGE/HYSTEROSCOPY WITH MYOSURE LAPAROSCOPIC BILATERAL SALPINGECTOMY IUD PLACEMENT (Bilateral) REMOVAL OF NON VAGINAL CONTRACEPTIVE DEVICE     Patient location during evaluation: PACU Anesthesia Type: General Level of consciousness: sedated, patient cooperative and oriented Pain management: pain level controlled Vital Signs Assessment: post-procedure vital signs reviewed and stable Respiratory status: spontaneous breathing, nonlabored ventilation and respiratory function stable Cardiovascular status: blood pressure returned to baseline and stable Postop Assessment: no apparent nausea or vomiting Anesthetic complications: no   There were no known notable events for this encounter.  Last Vitals:  Vitals:   05/09/23 1115 05/09/23 1130  BP: (!) 125/92 116/65  Pulse: 81 77  Resp: 13 16  Temp:  36.6 C  SpO2: 96% 96%    Last Pain:  Vitals:   05/09/23 1130  TempSrc:   PainSc: 0-No pain                 Esaul Dorwart,E. Cheyanne Lamison

## 2023-05-09 NOTE — Transfer of Care (Signed)
 Immediate Anesthesia Transfer of Care Note  Patient: Vanessa Adams  Procedure(s) Performed: DILATATION & CURETTAGE/HYSTEROSCOPY WITH MYOSURE LAPAROSCOPIC BILATERAL SALPINGECTOMY IUD PLACEMENT (Bilateral) REMOVAL OF NON VAGINAL CONTRACEPTIVE DEVICE  Patient Location: PACU  Anesthesia Type:General  Level of Consciousness: awake, alert , and oriented  Airway & Oxygen Therapy: Patient Spontanous Breathing  Post-op Assessment: Report given to RN and Post -op Vital signs reviewed and stable  Post vital signs: Reviewed and stable  Last Vitals:  Vitals Value Taken Time  BP 131/57 05/09/23 1046  Temp    Pulse 84 05/09/23 1048  Resp 13 05/09/23 1048  SpO2 96 % 05/09/23 1048  Vitals shown include unfiled device data.  Last Pain:  Vitals:   05/09/23 0848  TempSrc: Oral  PainSc: 0-No pain      Patients Stated Pain Goal: 7 (05/09/23 0848)  Complications: There were no known notable events for this encounter.

## 2023-05-10 LAB — SURGICAL PATHOLOGY

## 2023-05-11 ENCOUNTER — Encounter (HOSPITAL_COMMUNITY): Payer: Self-pay | Admitting: Obstetrics and Gynecology

## 2023-05-16 ENCOUNTER — Encounter: Payer: Self-pay | Admitting: Family Medicine

## 2023-05-16 ENCOUNTER — Other Ambulatory Visit: Payer: Self-pay

## 2023-05-16 ENCOUNTER — Ambulatory Visit (INDEPENDENT_AMBULATORY_CARE_PROVIDER_SITE_OTHER): Payer: 59 | Admitting: Family Medicine

## 2023-05-16 VITALS — BP 126/72 | HR 70 | Temp 98.6°F | Ht 69.0 in | Wt 233.4 lb

## 2023-05-16 DIAGNOSIS — Z0000 Encounter for general adult medical examination without abnormal findings: Secondary | ICD-10-CM

## 2023-05-16 DIAGNOSIS — E063 Autoimmune thyroiditis: Secondary | ICD-10-CM

## 2023-05-16 DIAGNOSIS — Z3041 Encounter for surveillance of contraceptive pills: Secondary | ICD-10-CM

## 2023-05-16 DIAGNOSIS — R140 Abdominal distension (gaseous): Secondary | ICD-10-CM | POA: Diagnosis not present

## 2023-05-16 DIAGNOSIS — E559 Vitamin D deficiency, unspecified: Secondary | ICD-10-CM

## 2023-05-16 DIAGNOSIS — R7303 Prediabetes: Secondary | ICD-10-CM | POA: Diagnosis not present

## 2023-05-16 DIAGNOSIS — N946 Dysmenorrhea, unspecified: Secondary | ICD-10-CM | POA: Diagnosis not present

## 2023-05-16 DIAGNOSIS — K219 Gastro-esophageal reflux disease without esophagitis: Secondary | ICD-10-CM

## 2023-05-16 DIAGNOSIS — E781 Pure hyperglyceridemia: Secondary | ICD-10-CM

## 2023-05-16 DIAGNOSIS — K5900 Constipation, unspecified: Secondary | ICD-10-CM | POA: Diagnosis not present

## 2023-05-16 DIAGNOSIS — E669 Obesity, unspecified: Secondary | ICD-10-CM | POA: Diagnosis not present

## 2023-05-16 DIAGNOSIS — F39 Unspecified mood [affective] disorder: Secondary | ICD-10-CM

## 2023-05-16 MED ORDER — FAMOTIDINE 20 MG PO TABS
20.0000 mg | ORAL_TABLET | Freq: Two times a day (BID) | ORAL | 3 refills | Status: AC
  Filled 2023-05-16 – 2023-06-21 (×2): qty 180, 90d supply, fill #0
  Filled 2023-10-29: qty 180, 90d supply, fill #1

## 2023-05-16 NOTE — Assessment & Plan Note (Signed)
 Feels she got some benefit initially from contrave but no longer needs  Taken off med list

## 2023-05-16 NOTE — Assessment & Plan Note (Signed)
 Last vitamin D Lab Results  Component Value Date   VD25OH 21.10 (L) 05/07/2023   Encouraged to get at least 4000 international units daily of D3 for bone and overall health

## 2023-05-16 NOTE — Assessment & Plan Note (Signed)
 Continues pepcid 20 mg bid  Doing well  Helpful  Encouraged to avoid food triggers

## 2023-05-16 NOTE — Assessment & Plan Note (Signed)
 Discussed how this problem influences overall health and the risks it imposes  Reviewed plan for weight loss with lower calorie diet (via better food choices (lower glycemic and portion control) along with exercise building up to or more than 30 minutes 5 days per week including some aerobic activity and strength training   Doing well  Pleased with weight loss so far  Encouraged to keep up the good work Is building muscle and going down in sizes

## 2023-05-16 NOTE — Assessment & Plan Note (Signed)
 Recent sterilization procedure Also mirena IUD for bleeding

## 2023-05-16 NOTE — Assessment & Plan Note (Signed)
 Some constipation from pain med after recent surgery  Encouraged trial of miralax Fluids Stool softener if needed

## 2023-05-16 NOTE — Assessment & Plan Note (Signed)
 No longer in dm range  Lab Results  Component Value Date   HGBA1C 5.1 05/07/2023   Commended disc imp of low glycemic diet and wt loss to prevent DM2

## 2023-05-16 NOTE — Assessment & Plan Note (Signed)
 Hypothyroidism  Pt has no clinical changes No change in energy level/ hair or skin/ edema and no tremor  (fatigue is improved) Lab Results  Component Value Date   TSH 3.13 05/07/2023    Plans to continue levothyroxine 125 mcg daily  Overall feels better

## 2023-05-16 NOTE — Patient Instructions (Addendum)
 Don't forget about miralax for constipation over the counter -as often as needed  It takes 2-3 days to work   Make sure you are taking at least 4000 international units of D3 daily (combined)   It will be time to start mammograms when you turn 38  Let us know if you need an order at that time   Stay off the contrave now  If you feel you need it again in the future let us know   Take care of yourself

## 2023-05-16 NOTE — Assessment & Plan Note (Signed)
 Recent surgery and pain medicine   Encouraged trial of miralax  Diet is overall good and good fluid intake

## 2023-05-16 NOTE — Assessment & Plan Note (Signed)
 Pt had recent D and C and mirena IUD  Hopes this will help

## 2023-05-16 NOTE — Assessment & Plan Note (Signed)
 Reviewed health habits including diet and exercise and skin cancer prevention Reviewed appropriate screening tests for age  Also reviewed health mt list, fam hx and immunization status , as well as social and family history   See HPI Labs reviewed and ordered Health Maintenance  Topic Date Due   Hepatitis C Screening  Never done   Pap with HPV screening  08/04/2021   COVID-19 Vaccine (3 - 2024-25 season) 06/01/2023*   Flu Shot  06/11/2023*   DTaP/Tdap/Td vaccine (2 - Td or Tdap) 01/16/2027   HIV Screening  Completed   HPV Vaccine  Aged Out  *Topic was postponed. The date shown is not the original due date.   Will need first mammo at 11- can call for order (dec 2025)  Will discuss colon screen at 43  Aware of fam history  Discussed fall prevention, supplements and exercise for bone density  PHQ 7  not currently treated/ stressful time/ discussed  Sent for pap report from gyn (recent D and C)

## 2023-05-16 NOTE — Assessment & Plan Note (Signed)
 Disc goals for lipids and reasons to control them Rev last labs with pt Rev low sat fat diet in detail Improved with better diet  Commended

## 2023-05-16 NOTE — Progress Notes (Signed)
 Subjective:    Patient ID: Vanessa Adams, female    DOB: 12-05-1985, 38 y.o.   MRN: [id number]  HPI  Here for health maintenance exam and to review chronic medical problems   Wt Readings from Last 3 Encounters:  05/16/23 233 lb 6 oz (105.9 kg)  05/09/23 224 lb 13.9 oz (102 kg)  03/26/23 232 lb 2 oz (105.3 kg)   34.46 kg/m  Losing weight  Pleased  Does not think contrave is affecting appetite  Thinks she can stop it   Vitals:   05/16/23 1022  BP: 126/72  Pulse: 70  Temp: 98.6 F (37 C)  SpO2: 98%    Immunization History  Administered Date(s) Administered   Influenza Split 11/29/2011, 11/18/2012   Influenza,inj,Quad PF,6+ Mos 11/08/2016, 12/26/2017, 12/12/2018, 02/04/2020   Influenza-Unspecified 11/11/2013   PFIZER(Purple Top)SARS-COV-2 Vaccination 05/23/2019, 06/17/2019   Tdap 01/15/2017    Health Maintenance Due  Topic Date Due   Hepatitis C Screening  Never done   Cervical Cancer Screening (HPV/Pap Cotest)  08/04/2021     Mammogram-will turn 38 on dec  Self breast exam-no lumps   Gyn health Had recent D and C with myosure . 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 Which was primary)  PGM died of renal failure at 86     Bone health   Falls-none  Fractures-none  Supplements -vit D and mvi  Last vitamin D Lab Results  Component Value Date   VD25OH 21.10 (L) 05/07/2023    Exercise  Regular/ daily with strength training   Lots of protein   Off exercise for surgery Anxious to get back to it  Will try yoga tonight     Mood     05/16/2023   10:29 AM 10/02/2022    9:10 AM 04/13/2022   10:09 AM 12/24/2020    9:14 AM 05/28/2019   11:32 AM  Depression screen PHQ 2/9  Decreased Interest 1 0 1 0 1  Down, Depressed, Hopeless 1 0 1 0 1  PHQ - 2 Score 2 0 2 0 2  Altered sleeping 1 0 2  3  Tired, decreased energy 1 0 2  3  Change in appetite 1 0 2  1  Feeling bad or failure about yourself  1 0 1  3  Trouble concentrating 1 0 0  2  Moving slowly or fidgety/restless 0 0 0  1  Suicidal thoughts 0 0 0  0  PHQ-9 Score 7 0 9  15  Difficult doing work/chores Not difficult at all Not difficult at all Somewhat difficult  Somewhat difficult   History of anxiety and depression  Some frustration re: work and recovering from surgery  Overall thinks she is ok w/o medication  Was on bupropion (part of contrave)- now stopped it    Hypothyroidism  Pt has no clinical changes No change in energy level/ hair or skin/ edema and no tremor Lab Results  Component Value Date   TSH 3.13 05/07/2023   Levothyroxine 125 mcg daily  Hyperlipidemia/ trieglycerides  Lab Results  Component Value Date   CHOL 128 05/07/2023   CHOL 147 06/14/2022   CHOL 123 05/22/2019   Lab Results  Component Value Date   HDL 41.40 05/07/2023   HDL 41 06/14/2022   HDL 34.70 (L) 05/22/2019   Lab Results  Component Value Date   LDLCALC 67 05/07/2023   LDLCALC 84 06/14/2022   LDLCALC 55 05/22/2019   Lab Results  Component Value Date   TRIG 98.0 05/07/2023   TRIG 122 06/14/2022   TRIG 167.0 (H) 05/22/2019   Lab Results  Component Value Date   CHOLHDL 3 05/07/2023   CHOLHDL 4 05/22/2019   CHOLHDL 3 12/26/2017   Lab Results  Component Value Date   LDLDIRECT 70.0 06/03/2015   LDLDIRECT 44.6 10/28/2012   Diet controlled   Prediabetes Lab Results  Component Value Date   HGBA1C 5.1 05/07/2023   HGBA1C 5.6 06/14/2022  Improved   Taking contrave for weight loss   GERD Pepcid   Lab Results  Component Value Date   NA 138 05/07/2023   K  4.3 05/07/2023   CO2 29 05/07/2023   GLUCOSE 93 05/07/2023   BUN 14 05/07/2023   CREATININE 0.60 05/07/2023   CALCIUM 9.1 05/07/2023   GFR 113.27 05/07/2023   GFRNONAA >60 02/14/2017   Lab Results  Component Value Date   WBC 5.4 05/07/2023   HGB 14.2 05/07/2023   HCT 42.2 05/07/2023   MCV 101.5 (H) 05/07/2023   PLT 259.0 05/07/2023   Lab Results  Component Value Date   ALT 18 05/07/2023   AST 15 05/07/2023   ALKPHOS 48 05/07/2023   BILITOT 0.7 05/07/2023      Patient Active Problem List   Diagnosis Date Noted   Routine general medical examination at a health care facility 05/16/2023   Obesity (BMI 30-39.9) 10/02/2022   Prediabetes 06/22/2022   Mood disorder-Emotional Eating 06/14/2022   Constipation 05/09/2021   Abdominal bloating 12/24/2020   Acid reflux 12/24/2020   Dysmenorrhea 02/04/2020   Vitamin D deficiency 02/04/2020   Contraception management 12/26/2017   Anxiety and depression 02/25/2014   Hypertriglyceridemia 11/04/2012   Hypothyroidism    Past Medical History:  Diagnosis Date   Allergy    Anxiety    Autism    Back pain    Bell palsy    Bell's Palsy 2015   BRCA negative 02/2020   MyRisk neg; IBIS=13.4%/riskscore=7.4%   Carpal tunnel syndrome    Constipation, acute    Family history of ovarian cancer    Gallbladder disorder    GERD (gastroesophageal reflux disease)    Hashimoto's disease    History of Bell's palsy    affected left side   Hypoglycemia    Rheumatoid arthritis (HCC)    Thyroid disease    hypothyroidism   Vitamin D deficiency    Past Surgical History:  Procedure Laterality Date   adenoids     CHOLECYSTECTOMY  2007   DILATATION & CURETTAGE/HYSTEROSCOPY WITH MYOSURE N/A 05/09/2023   Procedure: DILATATION & CURETTAGE/HYSTEROSCOPY WITH MYOSURE;  Surgeon: Steva Ready, DO;  Location: [facility] OR;  Service: Gynecology;  Laterality: N/A;  [facility]   LAPAROSCOPIC BILATERAL SALPINGECTOMY Bilateral 05/09/2023   Procedure: LAPAROSCOPIC  BILATERAL SALPINGECTOMY IUD PLACEMENT;  Surgeon: Steva Ready, DO;  Location: [facility] OR;  Service: Gynecology;  Laterality: Bilateral;   REMOVAL OF NON VAGINAL CONTRACEPTIVE DEVICE  05/09/2023   Procedure: REMOVAL OF NON VAGINAL CONTRACEPTIVE DEVICE;  Surgeon: Steva Ready, DO;  Location:  [facility] OR;  Service: Gynecology;;   TONSILECTOMY/ADENOIDECTOMY WITH MYRINGOTOMY     Social History   Tobacco Use   Smoking status: Former    Current packs/day: 0.00    Average packs/day: 0.3 packs/day for 3.0 years (0.8 ttl pk-yrs)    Types: Cigarettes    Quit date: 08/28/2008    Years since quitting: 14.7   Smokeless tobacco: Never   Tobacco comments:    quit at age 67  Vaping Use   Vaping status: Never Used  Substance Use Topics   Alcohol use: Not Currently    Comment: rare   Drug use: No   Family History  Problem Relation Age of Onset   Ovarian cysts Mother        has marker for ovarian cancer   Anxiety disorder Mother    Obesity Mother    Hyperlipidemia Father    Hypertension Father    Diabetes Father    Cancer Maternal Grandmother 75       ovarian cancer, colon, rectal   Hypothyroidism Paternal Grandmother    Skin cancer Paternal Grandfather 8   Allergies  Allergen Reactions   Sulfa Antibiotics Rash    stevens-johnsons, skin peels off    Tuberculin Tests Hives   Percocet [Oxycodone-Acetaminophen] Itching    Can tolerate tylenol   Vicodin [Hydrocodone-Acetaminophen] Itching   Current Outpatient Medications on File Prior to Visit  Medication Sig Dispense Refill   Calcium Carb-Cholecalciferol (CALCIUM 600 + D PO) Take 1 tablet by mouth daily.     ibuprofen (ADVIL) 800 MG tablet Take 1 tablet (800 mg total) by mouth every 8 (eight) hours as needed for moderate pain (pain score 4-6) or cramping. 30 tablet 0   IUD'S IU by Intrauterine route.     levothyroxine (SYNTHROID) 125 MCG tablet Take 1 tablet (125 mcg total) by mouth daily before breakfast. 90 tablet 0   Multiple Vitamin  (MULTIVITAMIN) capsule Take 1 capsule by mouth daily.     traMADol (ULTRAM) 50 MG tablet Take 0.5 tablets (25 mg total) by mouth every 6 (six) hours as needed (Pain). 12 tablet 0   No current facility-administered medications on file prior to visit.    Review of Systems  Constitutional:  Negative for activity change, appetite change, fatigue, fever and unexpected weight change.  HENT:  Negative for congestion, ear pain, rhinorrhea, sinus pressure and sore throat.   Eyes:  Negative for pain, redness and visual disturbance.  Respiratory:  Negative for cough, shortness of breath and wheezing.   Cardiovascular:  Negative for chest pain and palpitations.  Gastrointestinal:  Positive for constipation. Negative for abdominal pain, blood in stool and diarrhea.       Some bloating and constipation   Endocrine: Negative for polydipsia and polyuria.  Genitourinary:  Positive for pelvic pain. Negative for dysuria, frequency and urgency.       Post operative pelvic pain is improving gradually Bleeding has also slowed down  Musculoskeletal:  Negative for arthralgias, back pain and myalgias.  Skin:  Negative for pallor and rash.  Allergic/Immunologic: Negative for environmental allergies.  Neurological:  Negative for dizziness, syncope and headaches.  Hematological:  Negative for adenopathy. Does not bruise/bleed easily.  Psychiatric/Behavioral:  Negative for decreased concentration and dysphoric mood. The patient is not nervous/anxious.        Objective:   Physical Exam Constitutional:      General: She is not in acute distress.    Appearance: Normal appearance. She is well-developed. She is obese.  She is not ill-appearing or diaphoretic.  HENT:     Head: Normocephalic and atraumatic.     Right Ear: Tympanic membrane, ear canal and external ear normal.     Left Ear: Tympanic membrane, ear canal and external ear normal.     Nose: Nose normal. No congestion.     Mouth/Throat:     Mouth: Mucous  membranes are moist.     Pharynx: Oropharynx is clear. No posterior oropharyngeal erythema.  Eyes:     General: No scleral icterus.    Extraocular Movements: Extraocular movements intact.     Conjunctiva/sclera: Conjunctivae normal.     Pupils: Pupils are equal, round, and reactive to light.  Neck:     Thyroid: No thyromegaly.     Vascular: No carotid bruit or JVD.  Cardiovascular:     Rate and Rhythm: Normal rate and regular rhythm.     Pulses: Normal pulses.     Heart sounds: Normal heart sounds.     No gallop.  Pulmonary:     Effort: Pulmonary effort is normal. No respiratory distress.     Breath sounds: Normal breath sounds. No wheezing.     Comments: Good air exch Chest:     Chest wall: No tenderness.  Abdominal:     General: Bowel sounds are normal. There is no distension or abdominal bruit.     Palpations: Abdomen is soft. There is no mass.     Tenderness: There is no abdominal tenderness.     Hernia: No hernia is present.  Genitourinary:    Comments: Breast and pelvic exam are done by gyn provider   Musculoskeletal:        General: No tenderness. Normal range of motion.     Cervical back: Normal range of motion and neck supple. No rigidity. No muscular tenderness.     Right lower leg: No edema.     Left lower leg: No edema.     Comments: No kyphosis   Lymphadenopathy:     Cervical: No cervical adenopathy.  Skin:    General: Skin is warm and dry.     Coloration: Skin is not pale.     Findings: No erythema or rash.     Comments: Multiple brown flat nevi of different sizes on back /trunk  Few lentigines   Neurological:     Mental Status: She is alert. Mental status is at baseline.     Cranial Nerves: No cranial nerve deficit.     Motor: No abnormal muscle tone.     Coordination: Coordination normal.     Gait: Gait normal.     Deep Tendon Reflexes: Reflexes are normal and symmetric. Reflexes normal.  Psychiatric:        Mood and Affect: Mood normal.         Cognition and Memory: Cognition and memory normal.     Comments: Irritable when discussing work  Otherwise pleasant  Candidly discusses symptoms and stressors             Assessment & Plan:   Problem List Items Addressed This Visit       Digestive   Acid reflux   Continues pepcid 20 mg bid  Doing well  Helpful  Encouraged to avoid food triggers       Relevant Medications   famotidine (PEPCID) 20 MG tablet     Endocrine   Hypothyroidism   Hypothyroidism  Pt has no clinical changes No change in energy level/ hair or skin/  edema and no tremor  (fatigue is improved) Lab Results  Component Value Date   TSH 3.13 05/07/2023    Plans to continue levothyroxine 125 mcg daily  Overall feels better          Genitourinary   Dysmenorrhea   Pt had recent D and C and mirena IUD  Hopes this will help          Other   Vitamin D deficiency   Last vitamin D Lab Results  Component Value Date   VD25OH 21.10 (L) 05/07/2023   Encouraged to get at least 4000 international units daily of D3 for bone and overall health        Routine general medical examination at a health care facility - Primary   Reviewed health habits including diet and exercise and skin cancer prevention Reviewed appropriate screening tests for age  Also reviewed health mt list, fam hx and immunization status , as well as social and family history   See HPI Labs reviewed and ordered Health Maintenance  Topic Date Due   Hepatitis C Screening  Never done   Pap with HPV screening  08/04/2021   COVID-19 Vaccine (3 - 2024-25 season) 06/01/2023*   Flu Shot  06/11/2023*   DTaP/Tdap/Td vaccine (2 - Td or Tdap) 01/16/2027   HIV Screening  Completed   HPV Vaccine  Aged Out  *Topic was postponed. The date shown is not the original due date.   Will need first mammo at 14- can call for order (dec 2025)  Will discuss colon screen at 43  Aware of fam history  Discussed fall prevention, supplements and  exercise for bone density  PHQ 7  not currently treated/ stressful time/ discussed  Sent for pap report from gyn (recent D and C)      Prediabetes   No longer in dm range  Lab Results  Component Value Date   HGBA1C 5.1 05/07/2023   Commended disc imp of low glycemic diet and wt loss to prevent DM2       Obesity (BMI 30-39.9)   Discussed how this problem influences overall health and the risks it imposes  Reviewed plan for weight loss with lower calorie diet (via better food choices (lower glycemic and portion control) along with exercise building up to or more than 30 minutes 5 days per week including some aerobic activity and strength training   Doing well  Pleased with weight loss so far  Encouraged to keep up the good work Is building muscle and going down in sizes       Mood disorder-Emotional Eating   Feels she got some benefit initially from contrave but no longer needs  Taken off med list       Hypertriglyceridemia   Disc goals for lipids and reasons to control them Rev last labs with pt Rev low sat fat diet in detail Improved with better diet  Commended       Contraception management   Recent sterilization procedure Also mirena IUD for bleeding       Constipation   Recent surgery and pain medicine   Encouraged trial of miralax  Diet is overall good and good fluid intake       Abdominal bloating   Some constipation from pain med after recent surgery  Encouraged trial of miralax Fluids Stool softener if needed

## 2023-05-28 ENCOUNTER — Other Ambulatory Visit: Payer: Self-pay

## 2023-05-28 DIAGNOSIS — Z4889 Encounter for other specified surgical aftercare: Secondary | ICD-10-CM | POA: Diagnosis not present

## 2023-05-28 DIAGNOSIS — Z975 Presence of (intrauterine) contraceptive device: Secondary | ICD-10-CM | POA: Diagnosis not present

## 2023-06-21 ENCOUNTER — Other Ambulatory Visit: Payer: Self-pay

## 2023-07-19 ENCOUNTER — Ambulatory Visit: Admitting: Family Medicine

## 2023-07-19 ENCOUNTER — Other Ambulatory Visit: Payer: Self-pay

## 2023-07-19 ENCOUNTER — Encounter: Payer: Self-pay | Admitting: Family Medicine

## 2023-07-19 VITALS — BP 122/80 | HR 71 | Temp 98.5°F | Ht 69.0 in | Wt 236.4 lb

## 2023-07-19 DIAGNOSIS — J0100 Acute maxillary sinusitis, unspecified: Secondary | ICD-10-CM | POA: Diagnosis not present

## 2023-07-19 MED ORDER — AMOXICILLIN-POT CLAVULANATE 875-125 MG PO TABS
1.0000 | ORAL_TABLET | Freq: Two times a day (BID) | ORAL | 0 refills | Status: AC
  Filled 2023-07-19: qty 14, 7d supply, fill #0

## 2023-07-19 NOTE — Patient Instructions (Addendum)
 Try flonase  to get through the allergy season   Continue home treatment  Breathe steam  Warm and cool compresses on face    Take augmentin as directed  Update if not starting to improve in a week or if worsening

## 2023-07-19 NOTE — Progress Notes (Signed)
 Subjective:    Patient ID: Vanessa Adams, female    DOB: February 13, 1986, 38 y.o.   MRN: [id number]  HPI  Wt Readings from Last 3 Encounters:  07/19/23 236 lb 6 oz (107.2 kg)  05/16/23 233 lb 6 oz (105.9 kg)  05/09/23 224 lb 13.9 oz (102 kg)   34.91 kg/m  Vitals:   07/19/23 1009 07/19/23 1033  BP: (!) 148/94 122/80  Pulse: 71   Temp: 98.5 F (36.9 C)   SpO2: 98%     Pt presents with c/o sinus symptoms  Congested with allergies with over a month /worse outside   Left side of sinuses do not drain well since bell's palsy    Symptoms started Monday  Itching in face /allergies  Pressure and pain - worse on the left   (cheek was even hot and red on that side)  Referred to her back teeth  Congestion  Mucous is -green in am - a little lighter during the day  Cough -started this am/ productive in am  No wheezing or shortness of breath   No fever that she knows of  Feels hot in general    Left ear - pulsating [place] at night  Feels clogged also  Throat is itchy      Over the counter Allergy med-antihistamine /generic claritin  Netti pot  Hydration  Mucinex   Afrin- just for a day      Patient Active Problem List   Diagnosis Date Noted   Routine general medical examination at a health care facility 05/16/2023   Obesity (BMI 30-39.9) 10/02/2022   Prediabetes 06/22/2022   Mood disorder-Emotional Eating 06/14/2022   Constipation 05/09/2021   Abdominal bloating 12/24/2020   Acid reflux 12/24/2020   Dysmenorrhea 02/04/2020   Vitamin D  deficiency 02/04/2020   Contraception management 12/26/2017   Acute sinusitis 08/17/2015   Anxiety and depression 02/25/2014   Hypertriglyceridemia 11/04/2012   Hypothyroidism    Past Medical History:  Diagnosis Date   Allergy    Anxiety    Autism    Back pain    Bell palsy    Bell's Palsy 2015   BRCA negative 02/2020   MyRisk neg; IBIS=13.4%/riskscore=7.4%   Carpal tunnel syndrome    Constipation, acute    Family  history of ovarian cancer    Gallbladder disorder    GERD (gastroesophageal reflux disease)    Hashimoto's disease    History of Bell's palsy    affected left side   Hypoglycemia    Rheumatoid arthritis (HCC)    Thyroid  disease    hypothyroidism   Vitamin D  deficiency    Past Surgical History:  Procedure Laterality Date   adenoids     CHOLECYSTECTOMY  2007   DILATATION & CURETTAGE/HYSTEROSCOPY WITH MYOSURE N/A 05/09/2023   Procedure: DILATATION & CURETTAGE/HYSTEROSCOPY WITH MYOSURE;  Surgeon: Meldon Sport, DO;  Location: [facility] OR;  Service: Gynecology;  Laterality: N/A;  [facility]   LAPAROSCOPIC BILATERAL SALPINGECTOMY Bilateral 05/09/2023   Procedure: LAPAROSCOPIC BILATERAL SALPINGECTOMY IUD PLACEMENT;  Surgeon: Meldon Sport, DO;  Location: [facility] OR;  Service: Gynecology;  Laterality: Bilateral;   REMOVAL OF NON VAGINAL CONTRACEPTIVE DEVICE  05/09/2023   Procedure: REMOVAL OF NON VAGINAL CONTRACEPTIVE DEVICE;  Surgeon: Meldon Sport, DO;  Location: [facility] OR;  Service: Gynecology;;   TONSILECTOMY/ADENOIDECTOMY WITH MYRINGOTOMY     Social History   Tobacco Use   Smoking status: Former    Current packs/day: 0.00    Average packs/day: 0.3 packs/day for 3.0 years (  0.8 ttl pk-yrs)    Types: Cigarettes    Quit date: 08/28/2008    Years since quitting: 14.8   Smokeless tobacco: Never   Tobacco comments:    quit at age 68  Vaping Use   Vaping status: Never Used  Substance Use Topics   Alcohol use: Not Currently    Comment: rare   Drug use: No   Family History  Problem Relation Age of Onset   Ovarian cysts Mother        has marker for ovarian cancer   Anxiety disorder Mother    Obesity Mother    Hyperlipidemia Father    Hypertension Father    Diabetes Father    Cancer Maternal Grandmother 56       ovarian cancer, colon, rectal   Hypothyroidism Paternal Grandmother    Skin cancer Paternal Grandfather 18   Allergies  Allergen Reactions   Sulfa Antibiotics Rash     stevens-johnsons, skin peels off    Tuberculin Tests Hives   Percocet [Oxycodone -Acetaminophen ] Itching    Can tolerate tylenol    Vicodin [Hydrocodone -Acetaminophen ] Itching   Current Outpatient Medications on File Prior to Visit  Medication Sig Dispense Refill   Calcium  Carb-Cholecalciferol (CALCIUM  600 + D PO) Take 1 tablet by mouth daily.     famotidine  (PEPCID ) 20 MG tablet Take 1 tablet (20 mg total) by mouth 2 (two) times daily. 180 tablet 3   ibuprofen  (ADVIL ) 800 MG tablet Take 1 tablet (800 mg total) by mouth every 8 (eight) hours as needed for moderate pain (pain score 4-6) or cramping. 30 tablet 0   IUD'S IU by Intrauterine route.     levothyroxine  (SYNTHROID ) 125 MCG tablet Take 1 tablet (125 mcg total) by mouth daily before breakfast. 90 tablet 0   Multiple Vitamin (MULTIVITAMIN) capsule Take 1 capsule by mouth daily.     traMADol  (ULTRAM ) 50 MG tablet Take 0.5 tablets (25 mg total) by mouth every 6 (six) hours as needed (Pain). 12 tablet 0   No current facility-administered medications on file prior to visit.    Review of Systems  Constitutional:  Positive for appetite change. Negative for fatigue and fever.  HENT:  Positive for congestion, ear pain, postnasal drip, rhinorrhea, sinus pressure and sore throat. Negative for nosebleeds.   Eyes:  Negative for pain, redness and itching.  Respiratory:  Positive for cough. Negative for shortness of breath and wheezing.   Cardiovascular:  Negative for chest pain.  Gastrointestinal:  Negative for abdominal pain, diarrhea, nausea and vomiting.  Endocrine: Negative for polyuria.  Genitourinary:  Negative for dysuria, frequency and urgency.  Musculoskeletal:  Negative for arthralgias and myalgias.  Allergic/Immunologic: Negative for immunocompromised state.  Neurological:  Positive for headaches. Negative for dizziness, tremors, syncope, weakness and numbness.  Hematological:  Negative for adenopathy. Does not bruise/bleed easily.   Psychiatric/Behavioral:  Negative for dysphoric mood. The patient is not nervous/anxious.        Objective:   Physical Exam Constitutional:      General: She is not in acute distress.    Appearance: Normal appearance. She is well-developed. She is obese. She is not ill-appearing.  HENT:     Head: Normocephalic and atraumatic.     Comments: Nares are injected and congested  Clear pnd   Bilateral maxillary sinus tenderness (much worse on left) No facial swelling today    Right Ear: Tympanic membrane and external ear normal.     Left Ear: Tympanic membrane, ear canal and external  ear normal.     Nose: Congestion and rhinorrhea present.     Mouth/Throat:     Mouth: Mucous membranes are moist.     Pharynx: No oropharyngeal exudate or posterior oropharyngeal erythema.     Comments: Clear pnd Eyes:     General:        Right eye: No discharge.        Left eye: No discharge.     Conjunctiva/sclera: Conjunctivae normal.     Pupils: Pupils are equal, round, and reactive to light.  Cardiovascular:     Rate and Rhythm: Normal rate and regular rhythm.  Pulmonary:     Effort: Pulmonary effort is normal. No respiratory distress.     Breath sounds: Normal breath sounds. No stridor. No wheezing, rhonchi or rales.  Musculoskeletal:     Cervical back: Normal range of motion and neck supple.  Lymphadenopathy:     Cervical: No cervical adenopathy.  Skin:    General: Skin is warm and dry.     Findings: No rash.  Neurological:     Mental Status: She is alert.     Cranial Nerves: No cranial nerve deficit.  Psychiatric:        Mood and Affect: Mood normal.           Assessment & Plan:   Problem List Items Addressed This Visit       Respiratory   Acute sinusitis - Primary   S/p allergy congestion  Left sided facial pain and pressure with purulent nasal d/c Reassuring exam  Prescription augmentin  Disc symptomatic care - see instructions on AVS   Update if not starting to  improve in a week or if worsening  Call back and Er precautions noted in detail today        Relevant Medications   amoxicillin -clavulanate (AUGMENTIN) 875-125 MG tablet

## 2023-07-19 NOTE — Assessment & Plan Note (Signed)
 S/p allergy congestion  Left sided facial pain and pressure with purulent nasal d/c Reassuring exam  Prescription augmentin  Disc symptomatic care - see instructions on AVS   Update if not starting to improve in a week or if worsening  Call back and Er precautions noted in detail today

## 2023-08-08 ENCOUNTER — Other Ambulatory Visit: Payer: Self-pay | Admitting: Family Medicine

## 2023-08-08 ENCOUNTER — Other Ambulatory Visit: Payer: Self-pay

## 2023-08-08 DIAGNOSIS — E079 Disorder of thyroid, unspecified: Secondary | ICD-10-CM

## 2023-08-09 ENCOUNTER — Other Ambulatory Visit: Payer: Self-pay

## 2023-08-10 ENCOUNTER — Other Ambulatory Visit: Payer: Self-pay

## 2023-08-10 MED ORDER — LEVOTHYROXINE SODIUM 125 MCG PO TABS
125.0000 ug | ORAL_TABLET | Freq: Every day | ORAL | 2 refills | Status: AC
  Filled 2023-08-10: qty 30, 30d supply, fill #0
  Filled 2023-09-09: qty 30, 30d supply, fill #1
  Filled 2023-10-22: qty 30, 30d supply, fill #2
  Filled 2023-10-29: qty 30, 30d supply, fill #3
  Filled 2023-12-11 (×3): qty 90, 90d supply, fill #3
  Filled 2024-04-06: qty 90, 90d supply, fill #4
  Filled 2024-04-07: qty 90, 90d supply, fill #0

## 2023-10-16 DIAGNOSIS — Z30432 Encounter for removal of intrauterine contraceptive device: Secondary | ICD-10-CM | POA: Diagnosis not present

## 2023-10-16 DIAGNOSIS — Z30017 Encounter for initial prescription of implantable subdermal contraceptive: Secondary | ICD-10-CM | POA: Diagnosis not present

## 2023-10-30 ENCOUNTER — Other Ambulatory Visit: Payer: Self-pay

## 2023-11-09 ENCOUNTER — Other Ambulatory Visit: Payer: Self-pay

## 2023-11-15 ENCOUNTER — Encounter: Payer: Self-pay | Admitting: Emergency Medicine

## 2023-11-15 ENCOUNTER — Ambulatory Visit
Admission: EM | Admit: 2023-11-15 | Discharge: 2023-11-15 | Disposition: A | Discharge status: Home / Self Care | Attending: Emergency Medicine | Admitting: Emergency Medicine

## 2023-11-15 ENCOUNTER — Other Ambulatory Visit: Payer: Self-pay

## 2023-11-15 DIAGNOSIS — R42 Dizziness and giddiness: Secondary | ICD-10-CM

## 2023-11-15 DIAGNOSIS — S51831A Puncture wound without foreign body of right forearm, initial encounter: Secondary | ICD-10-CM

## 2023-11-15 DIAGNOSIS — S50811A Abrasion of right forearm, initial encounter: Secondary | ICD-10-CM | POA: Diagnosis not present

## 2023-11-15 DIAGNOSIS — R55 Syncope and collapse: Secondary | ICD-10-CM | POA: Diagnosis not present

## 2023-11-15 DIAGNOSIS — W5503XA Scratched by cat, initial encounter: Secondary | ICD-10-CM

## 2023-11-15 DIAGNOSIS — S61431A Puncture wound without foreign body of right hand, initial encounter: Secondary | ICD-10-CM

## 2023-11-15 LAB — GLUCOSE, POCT (MANUAL RESULT ENTRY): POC Glucose: 113 mg/dL — AB (ref 70–99)

## 2023-11-15 MED ORDER — DOXYCYCLINE HYCLATE 100 MG PO CAPS
100.0000 mg | ORAL_CAPSULE | Freq: Two times a day (BID) | ORAL | 0 refills | Status: AC
Start: 2023-11-15 — End: ?
  Filled 2023-11-15: qty 14, 7d supply, fill #0

## 2023-11-15 MED ORDER — TRAMADOL HCL 50 MG PO TABS
50.0000 mg | ORAL_TABLET | Freq: Four times a day (QID) | ORAL | 0 refills | Status: AC | PRN
  Filled 2023-11-15: qty 12, 3d supply, fill #0

## 2023-11-15 MED ORDER — MELOXICAM 15 MG PO TABS
15.0000 mg | ORAL_TABLET | Freq: Every day | ORAL | 0 refills | Status: AC
  Filled 2023-11-15: qty 30, 30d supply, fill #0

## 2023-11-15 MED ORDER — KETOROLAC TROMETHAMINE 30 MG/ML IJ SOLN
30.0000 mg | Freq: Once | INTRAMUSCULAR | Status: AC
  Administered 2023-11-15: 30 mg via INTRAMUSCULAR

## 2023-11-15 NOTE — ED Triage Notes (Signed)
 Patient reports that her cat attacked and scratched her. Patient reports that the cats has had his vaccine. Patient states she does not know when her last tetanus shot was. Patient complains of pain and swelling to right hand and wrist area. Patient states it only hurts when she moves wrist. Rates pain 6/10.

## 2023-11-15 NOTE — ED Provider Notes (Signed)
 Vanessa Adams    CSN: [id number] Arrival date & time: 11/15/23  1426      History   Chief Complaint Chief Complaint  Patient presents with   Animal Bite    HPI Vanessa Adams is a 38 y.o. female.   Patient presents for evaluation of cat scratches to the right wrist and hand beginning today within the hour.  Endorses her cats were attempting to fight and while breaking it up she became injured.  Endorses significant swelling which has improved, also endorsing severe pain, rating a 7 out of 10.  Described distal.  Pain only when moving the wrist.  Has not attempted treatment.  Unknown tetanus.  Past Medical History:  Diagnosis Date   Allergy    Anxiety    Autism    Back pain    Bell palsy    Bell's Palsy 2015   BRCA negative 02/2020   MyRisk neg; IBIS=13.4%/riskscore=7.4%   Carpal tunnel syndrome    Constipation, acute    Family history of ovarian cancer    Gallbladder disorder    GERD (gastroesophageal reflux disease)    Hashimoto's disease    History of Bell's palsy    affected left side   Hypoglycemia    Rheumatoid arthritis (HCC)    Thyroid  disease    hypothyroidism   Vitamin D  deficiency     Patient Active Problem List   Diagnosis Date Noted   Routine general medical examination at a health care facility 05/16/2023   Obesity (BMI 30-39.9) 10/02/2022   Prediabetes 06/22/2022   Mood disorder-Emotional Eating 06/14/2022   Constipation 05/09/2021   Abdominal bloating 12/24/2020   Acid reflux 12/24/2020   Dysmenorrhea 02/04/2020   Vitamin D  deficiency 02/04/2020   Contraception management 12/26/2017   Acute sinusitis 08/17/2015   Anxiety and depression 02/25/2014   Hypertriglyceridemia 11/04/2012   Hypothyroidism     Past Surgical History:  Procedure Laterality Date   adenoids     CHOLECYSTECTOMY  2007   DILATATION & CURETTAGE/HYSTEROSCOPY WITH MYOSURE N/A 05/09/2023   Procedure: DILATATION & CURETTAGE/HYSTEROSCOPY WITH MYOSURE;  Surgeon:  Storm Setter, DO;  Location: [facility] OR;  Service: Gynecology;  Laterality: N/A;  [facility]   LAPAROSCOPIC BILATERAL SALPINGECTOMY Bilateral 05/09/2023   Procedure: LAPAROSCOPIC BILATERAL SALPINGECTOMY IUD PLACEMENT;  Surgeon: Storm Setter, DO;  Location: [facility] OR;  Service: Gynecology;  Laterality: Bilateral;   REMOVAL OF NON VAGINAL CONTRACEPTIVE DEVICE  05/09/2023   Procedure: REMOVAL OF NON VAGINAL CONTRACEPTIVE DEVICE;  Surgeon: Storm Setter, DO;  Location: [facility] OR;  Service: Gynecology;;   TONSILECTOMY/ADENOIDECTOMY WITH MYRINGOTOMY      OB History     Gravida  3   Para  2   Term  2   Preterm      AB  1   Living  2      SAB  1   IAB      Ectopic      Multiple  0   Live Births  2        Obstetric Comments  Menstrual age: 51 Age 1st Pregnancy: 23           Home Medications    Prior to Admission medications   Medication Sig Start Date End Date Taking? Authorizing Provider  doxycycline  (VIBRAMYCIN ) 100 MG capsule Take 1 capsule (100 mg total) by mouth 2 (two) times daily. 11/15/23  Yes Olamide Lahaie R, NP  meloxicam  (MOBIC ) 15 MG tablet Take 1 tablet (15 mg total) by mouth  daily. 11/15/23  Yes Markeda Narvaez, Shelba SAUNDERS, NP  traMADol  (ULTRAM ) 50 MG tablet Take 1 tablet (50 mg total) by mouth every 6 (six) hours as needed. 11/15/23  Yes Benicio Manna, Shelba SAUNDERS, NP  amoxicillin -clavulanate (AUGMENTIN ) 875-125 MG tablet Take 1 tablet by mouth 2 (two) times daily. 07/19/23   Tower, Laine LABOR, MD  Calcium  Carb-Cholecalciferol (CALCIUM  600 + D PO) Take 1 tablet by mouth daily.    [provider]  famotidine  (PEPCID ) 20 MG tablet Take 1 tablet (20 mg total) by mouth 2 (two) times daily. 05/16/23   Tower, Laine LABOR, MD  ibuprofen  (ADVIL ) 800 MG tablet Take 1 tablet (800 mg total) by mouth every 8 (eight) hours as needed for moderate pain (pain score 4-6) or cramping. 05/09/23   Storm Setter, DO  IUD'S IU by Intrauterine route.    [provider]  levothyroxine  (SYNTHROID ) 125 MCG  tablet Take 1 tablet (125 mcg total) by mouth daily before breakfast. 08/10/23   Tower, Laine LABOR, MD  Multiple Vitamin (MULTIVITAMIN) capsule Take 1 capsule by mouth daily.    [provider]    Family History Family History  Problem Relation Age of Onset   Ovarian cysts Mother        has marker for ovarian cancer   Anxiety disorder Mother    Obesity Mother    Hyperlipidemia Father    Hypertension Father    Diabetes Father    Cancer Maternal Grandmother 57       ovarian cancer, colon, rectal   Hypothyroidism Paternal Grandmother    Skin cancer Paternal Grandfather 28    Social History Social History   Tobacco Use   Smoking status: Former    Current packs/day: 0.00    Average packs/day: 0.3 packs/day for 3.0 years (0.8 ttl pk-yrs)    Types: Cigarettes    Quit date: 08/28/2008    Years since quitting: 15.2   Smokeless tobacco: Never   Tobacco comments:    quit at age 41  Vaping Use   Vaping status: Never Used  Substance Use Topics   Alcohol use: Not Currently    Comment: rare   Drug use: No     Allergies   Sulfa antibiotics, Tuberculin tests, Percocet [oxycodone -acetaminophen ], and Vicodin [hydrocodone -acetaminophen ]   Review of Systems Review of Systems   Physical Exam Triage Vital Signs ED Triage Vitals  Encounter Vitals Group     BP 11/15/23 1540 118/87     Girls Systolic BP Percentile --      Girls Diastolic BP Percentile --      Boys Systolic BP Percentile --      Boys Diastolic BP Percentile --      Pulse Rate 11/15/23 1540 83     Resp 11/15/23 1540 18     Temp 11/15/23 1540 98.3 F (36.8 C)     Temp Source 11/15/23 1540 Oral     SpO2 11/15/23 1540 99 %     Weight --      Height --      Head Circumference --      Peak Flow --      Pain Score 11/15/23 1535 7     Pain Loc --      Pain Education --      Exclude from Growth Chart --    No data found.  Updated Vital Signs BP 118/87 (BP Location: Left Arm)   Pulse 83   Temp 98.3 F  (36.8 C) (Oral)  Resp 18   SpO2 99%   Visual Acuity Right Eye Distance:   Left Eye Distance:   Bilateral Distance:    Right Eye Near:   Left Eye Near:    Bilateral Near:     Physical Exam Constitutional:      Appearance: Normal appearance.  Eyes:     Extraocular Movements: Extraocular movements intact.  Pulmonary:     Effort: Pulmonary effort is normal.  Skin:    Comments: Excoriation present to the right forearm and right hand  Neurological:     Mental Status: She is alert and oriented to person, place, and time. Mental status is at baseline.      UC Treatments / Results  Labs (all labs ordered are listed, but only abnormal results are displayed) Labs Reviewed  GLUCOSE, POCT (MANUAL RESULT ENTRY) - Abnormal; Notable for the following components:      Result Value   POC Glucose 113 (*)    All other components within normal limits    EKG   Radiology No results found.  Procedures Procedures (including critical care time)  Medications Ordered in UC Medications  ketorolac  (TORADOL ) 30 MG/ML injection 30 mg (30 mg Intramuscular Given 11/15/23 1608)    Initial Impression / Assessment and Plan / UC Course  I have reviewed the triage vital signs and the nursing notes.  Pertinent labs & imaging results that were available during my care of the patient were reviewed by me and considered in my medical decision making (see chart for details).  Puncture wound of the right forearm, puncture wound of the right hand without foreign body or injury to the nail, cat scratch to right forearm  Lightheadedness, near syncope  Cat scratches present to the forearm and hand, wounds clean chlorhexidine , not clotted therefore bandaging not applied, Toradol  IM given last tetanus 2018, prescribed doxycycline  to prevent infection and prescribed meloxicam  and tramadol  for pain management, PDMP reviewed, low risk, recommended daily cleansing of the wounds and monitoring, advised to  follow-up for any delayed healing  Upon checking patient endorsing significant lightheadedness feeling as if she is going to pass out, brought to the back and vital signs stable and within normal ranges, patient diaphoretic, EKG showing normal sinus rhythm and point-of-care CBG 113, no loss of consciousness, endorsing that it is the pain, at that time was recommending patient to go to the nearest emergency department to rule out further causes, declined emergency department evaluation therefore patient was placed back in the waiting room until her time to be seen, on reevaluation symptoms have resolved   Final Clinical Impressions(s) / UC Diagnoses   Final diagnoses:  Cat scratch of right forearm, initial encounter  Lightheadedness  Near syncope  Puncture wound of right forearm, initial encounter  Puncture wound of right hand without foreign body, initial encounter     Discharge Instructions      Today you are evaluated for the cat scratches to your arm which have been cleansed here in the clinic  Cleanse daily at home with unscented soap and water, pat do not rub, may cover with a nonadherent dressing if at risk for becoming dirty  Take doxycycline  twice daily for 7 days to prevent area from becoming infected  You have been given an injection of Toradol  to help reduce inflammation and help with pain and ideally will see some relief within the hour  Starting tomorrow you may take meloxicam  every morning to continue the process above, avoid use of ibuprofen , naproxen  Advil  Aleve but may use Tylenol   For severe pain you may use tramadol  every 6 hours but please be mindful this can make you drowsy  Elevate on pillows whenever sitting and lying to help reduce swelling  You may apply ice over the affected area in 10 to 15-minute intervals for the next 24 to 48 hours  If you have any concerns regarding healing you may follow-up for reevaluation  Vanessa Adams when you came into the clinic you  were lightheaded, at this time this has resolved and is most likely pain related  EKG show the heart was beating in a normal pace and rhythm, blood sugar was 113 and all vital signs were within normal ranges   ED Prescriptions     Medication Sig Dispense Auth. Provider   meloxicam  (MOBIC ) 15 MG tablet Take 1 tablet (15 mg total) by mouth daily. 30 tablet Zabria Liss R, NP   traMADol  (ULTRAM ) 50 MG tablet Take 1 tablet (50 mg total) by mouth every 6 (six) hours as needed. 12 tablet Hodan Wurtz R, NP   doxycycline  (VIBRAMYCIN ) 100 MG capsule Take 1 capsule (100 mg total) by mouth 2 (two) times daily. 14 capsule Kaulder Zahner, Shelba SAUNDERS, NP      I have reviewed the PDMP during this encounter.   Teresa Shelba SAUNDERS, NP 11/15/23 [phone]

## 2023-11-15 NOTE — Discharge Instructions (Addendum)
 Today you are evaluated for the cat scratches to your arm which have been cleansed here in the clinic  Cleanse daily at home with unscented soap and water, pat do not rub, may cover with a nonadherent dressing if at risk for becoming dirty  Take doxycycline  twice daily for 7 days to prevent area from becoming infected  You have been given an injection of Toradol  to help reduce inflammation and help with pain and ideally will see some relief within the hour  Starting tomorrow you may take meloxicam  every morning to continue the process above, avoid use of ibuprofen , naproxen Advil  Aleve but may use Tylenol   For severe pain you may use tramadol  every 6 hours but please be mindful this can make you drowsy  Elevate on pillows whenever sitting and lying to help reduce swelling  You may apply ice over the affected area in 10 to 15-minute intervals for the next 24 to 48 hours  If you have any concerns regarding healing you may follow-up for reevaluation  Rosina when you came into the clinic you were lightheaded, at this time this has resolved and is most likely pain related  EKG show the heart was beating in a normal pace and rhythm, blood sugar was 113 and all vital signs were within normal ranges

## 2023-12-11 ENCOUNTER — Other Ambulatory Visit: Payer: Self-pay

## 2023-12-11 ENCOUNTER — Other Ambulatory Visit (HOSPITAL_COMMUNITY): Payer: Self-pay

## 2024-01-25 ENCOUNTER — Other Ambulatory Visit: Payer: Self-pay | Admitting: Family Medicine

## 2024-01-25 ENCOUNTER — Other Ambulatory Visit: Payer: Self-pay

## 2024-01-25 DIAGNOSIS — F419 Anxiety disorder, unspecified: Secondary | ICD-10-CM

## 2024-01-26 ENCOUNTER — Other Ambulatory Visit: Payer: Self-pay

## 2024-01-28 ENCOUNTER — Other Ambulatory Visit: Payer: Self-pay

## 2024-01-28 MED FILL — Hydroxyzine HCl Tab 25 MG: ORAL | 10 days supply | Qty: 30 | Fill #0 | Status: AC

## 2024-01-28 NOTE — Telephone Encounter (Signed)
 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

## 2024-01-28 NOTE — Telephone Encounter (Signed)
 I refilled it   Please check in re: mood and see if she is ok mood wise / if she may need follow up appointment   Thanks

## 2024-01-28 NOTE — Telephone Encounter (Signed)
 Not on med list and looking at prev med refill I can see PCP filled med once in 2023 and that's it please advise   CPE was on 05/16/23

## 2024-01-29 ENCOUNTER — Other Ambulatory Visit: Payer: Self-pay

## 2024-03-28 ENCOUNTER — Other Ambulatory Visit: Payer: Self-pay

## 2024-04-07 ENCOUNTER — Other Ambulatory Visit: Payer: Self-pay
